# Patient Record
Sex: Female | Born: 1945 | Race: White | Hispanic: No | Smoking: Never smoker
Health system: Southern US, Community
[De-identification: ages and names within clinical notes are randomized; demographics above are authoritative.]

## PROBLEM LIST (undated history)

## (undated) DIAGNOSIS — Z86010 Personal history of colonic polyps: Secondary | ICD-10-CM

## (undated) DIAGNOSIS — A389 Scarlet fever, uncomplicated: Secondary | ICD-10-CM

## (undated) DIAGNOSIS — H3530 Unspecified macular degeneration: Secondary | ICD-10-CM

## (undated) DIAGNOSIS — R1310 Dysphagia, unspecified: Secondary | ICD-10-CM

## (undated) DIAGNOSIS — R87619 Unspecified abnormal cytological findings in specimens from cervix uteri: Secondary | ICD-10-CM

## (undated) DIAGNOSIS — IMO0002 Reserved for concepts with insufficient information to code with codable children: Secondary | ICD-10-CM

## (undated) DIAGNOSIS — S8291XA Unspecified fracture of right lower leg, initial encounter for closed fracture: Secondary | ICD-10-CM

## (undated) DIAGNOSIS — D259 Leiomyoma of uterus, unspecified: Secondary | ICD-10-CM

## (undated) DIAGNOSIS — E039 Hypothyroidism, unspecified: Secondary | ICD-10-CM

## (undated) DIAGNOSIS — M810 Age-related osteoporosis without current pathological fracture: Secondary | ICD-10-CM

## (undated) DIAGNOSIS — K449 Diaphragmatic hernia without obstruction or gangrene: Secondary | ICD-10-CM

## (undated) DIAGNOSIS — L299 Pruritus, unspecified: Secondary | ICD-10-CM

## (undated) DIAGNOSIS — K219 Gastro-esophageal reflux disease without esophagitis: Secondary | ICD-10-CM

## (undated) DIAGNOSIS — K222 Esophageal obstruction: Secondary | ICD-10-CM

## (undated) DIAGNOSIS — E782 Mixed hyperlipidemia: Secondary | ICD-10-CM

## (undated) DIAGNOSIS — N952 Postmenopausal atrophic vaginitis: Secondary | ICD-10-CM

## (undated) DIAGNOSIS — Z8719 Personal history of other diseases of the digestive system: Secondary | ICD-10-CM

## (undated) DIAGNOSIS — B009 Herpesviral infection, unspecified: Secondary | ICD-10-CM

## (undated) DIAGNOSIS — R079 Chest pain, unspecified: Secondary | ICD-10-CM

## (undated) HISTORY — PX: COLONOSCOPY: SHX174

## (undated) HISTORY — PX: GYNECOLOGIC CRYOSURGERY: SHX857

## (undated) HISTORY — DX: Unspecified macular degeneration: H35.30

## (undated) HISTORY — DX: Personal history of colonic polyps: Z86.010

## (undated) HISTORY — DX: Diaphragmatic hernia without obstruction or gangrene: K44.9

## (undated) HISTORY — DX: Gastro-esophageal reflux disease without esophagitis: K21.9

## (undated) HISTORY — DX: Unspecified fracture of right lower leg, initial encounter for closed fracture: S82.91XA

## (undated) HISTORY — DX: Dysphagia, unspecified: R13.10

## (undated) HISTORY — DX: Esophageal obstruction: K22.2

## (undated) HISTORY — DX: Chest pain, unspecified: R07.9

## (undated) HISTORY — DX: Leiomyoma of uterus, unspecified: D25.9

## (undated) HISTORY — PX: BREAST EXCISIONAL BIOPSY: SUR124

## (undated) HISTORY — DX: Hypothyroidism, unspecified: E03.9

## (undated) HISTORY — DX: Personal history of other diseases of the digestive system: Z87.19

## (undated) HISTORY — DX: Reserved for concepts with insufficient information to code with codable children: IMO0002

## (undated) HISTORY — PX: COLPOSCOPY: SHX161

## (undated) HISTORY — DX: Age-related osteoporosis without current pathological fracture: M81.0

## (undated) HISTORY — DX: Herpesviral infection, unspecified: B00.9

## (undated) HISTORY — DX: Scarlet fever, uncomplicated: A38.9

## (undated) HISTORY — PX: RHINOPLASTY: SUR1284

## (undated) HISTORY — DX: Postmenopausal atrophic vaginitis: N95.2

## (undated) HISTORY — PX: BREAST SURGERY: SHX581

## (undated) HISTORY — PX: TUBAL LIGATION: SHX77

## (undated) HISTORY — DX: Mixed hyperlipidemia: E78.2

## (undated) HISTORY — DX: Unspecified abnormal cytological findings in specimens from cervix uteri: R87.619

## (undated) HISTORY — DX: Pruritus, unspecified: L29.9

---

## 1998-07-15 ENCOUNTER — Other Ambulatory Visit: Admission: RE | Admit: 1998-07-15 | Discharge: 1998-07-15 | Payer: Self-pay | Admitting: Obstetrics and Gynecology

## 1998-07-31 ENCOUNTER — Ambulatory Visit (HOSPITAL_COMMUNITY): Admission: RE | Admit: 1998-07-31 | Discharge: 1998-07-31 | Payer: Self-pay | Admitting: Internal Medicine

## 1998-08-04 ENCOUNTER — Ambulatory Visit (HOSPITAL_COMMUNITY): Admission: RE | Admit: 1998-08-04 | Discharge: 1998-08-04 | Payer: Self-pay | Admitting: Internal Medicine

## 1998-08-05 ENCOUNTER — Encounter: Payer: Self-pay | Admitting: Internal Medicine

## 1999-07-27 ENCOUNTER — Other Ambulatory Visit: Admission: RE | Admit: 1999-07-27 | Discharge: 1999-07-27 | Payer: Self-pay | Admitting: Obstetrics and Gynecology

## 2000-09-04 ENCOUNTER — Other Ambulatory Visit: Admission: RE | Admit: 2000-09-04 | Discharge: 2000-09-04 | Payer: Self-pay | Admitting: Obstetrics and Gynecology

## 2001-09-05 ENCOUNTER — Other Ambulatory Visit: Admission: RE | Admit: 2001-09-05 | Discharge: 2001-09-05 | Payer: Self-pay | Admitting: Obstetrics and Gynecology

## 2002-09-06 ENCOUNTER — Other Ambulatory Visit: Admission: RE | Admit: 2002-09-06 | Discharge: 2002-09-06 | Payer: Self-pay | Admitting: Obstetrics and Gynecology

## 2003-09-18 ENCOUNTER — Other Ambulatory Visit: Admission: RE | Admit: 2003-09-18 | Discharge: 2003-09-18 | Payer: Self-pay | Admitting: Obstetrics and Gynecology

## 2003-10-22 ENCOUNTER — Encounter: Admission: RE | Admit: 2003-10-22 | Discharge: 2003-10-22 | Payer: Self-pay | Admitting: Orthopedic Surgery

## 2003-11-03 ENCOUNTER — Encounter: Admission: RE | Admit: 2003-11-03 | Discharge: 2003-11-03 | Payer: Self-pay | Admitting: Obstetrics and Gynecology

## 2004-09-23 ENCOUNTER — Other Ambulatory Visit: Admission: RE | Admit: 2004-09-23 | Discharge: 2004-09-23 | Payer: Self-pay | Admitting: Obstetrics and Gynecology

## 2004-11-15 ENCOUNTER — Encounter: Admission: RE | Admit: 2004-11-15 | Discharge: 2004-11-15 | Payer: Self-pay | Admitting: Obstetrics and Gynecology

## 2004-12-28 ENCOUNTER — Emergency Department (HOSPITAL_COMMUNITY): Admission: EM | Admit: 2004-12-28 | Discharge: 2004-12-28 | Payer: Self-pay | Admitting: Emergency Medicine

## 2005-02-14 ENCOUNTER — Ambulatory Visit: Payer: Self-pay | Admitting: Internal Medicine

## 2005-09-26 ENCOUNTER — Other Ambulatory Visit: Admission: RE | Admit: 2005-09-26 | Discharge: 2005-09-26 | Payer: Self-pay | Admitting: Obstetrics and Gynecology

## 2005-10-10 ENCOUNTER — Ambulatory Visit: Payer: Self-pay | Admitting: Internal Medicine

## 2005-10-12 ENCOUNTER — Ambulatory Visit: Payer: Self-pay | Admitting: Internal Medicine

## 2005-11-07 ENCOUNTER — Ambulatory Visit: Payer: Self-pay | Admitting: Gastroenterology

## 2005-12-21 ENCOUNTER — Encounter: Admission: RE | Admit: 2005-12-21 | Discharge: 2005-12-21 | Payer: Self-pay | Admitting: Obstetrics and Gynecology

## 2006-01-24 ENCOUNTER — Encounter (INDEPENDENT_AMBULATORY_CARE_PROVIDER_SITE_OTHER): Payer: Self-pay | Admitting: *Deleted

## 2006-01-24 ENCOUNTER — Ambulatory Visit: Payer: Self-pay | Admitting: Gastroenterology

## 2006-05-16 ENCOUNTER — Ambulatory Visit: Payer: Self-pay | Admitting: Internal Medicine

## 2006-09-11 ENCOUNTER — Ambulatory Visit: Payer: Self-pay | Admitting: Internal Medicine

## 2006-09-14 ENCOUNTER — Ambulatory Visit: Payer: Self-pay | Admitting: Internal Medicine

## 2006-10-10 ENCOUNTER — Ambulatory Visit: Payer: Self-pay | Admitting: Internal Medicine

## 2006-10-18 ENCOUNTER — Other Ambulatory Visit: Admission: RE | Admit: 2006-10-18 | Discharge: 2006-10-18 | Payer: Self-pay | Admitting: Obstetrics and Gynecology

## 2007-01-04 ENCOUNTER — Encounter: Admission: RE | Admit: 2007-01-04 | Discharge: 2007-01-04 | Payer: Self-pay | Admitting: Obstetrics and Gynecology

## 2007-02-10 ENCOUNTER — Encounter (INDEPENDENT_AMBULATORY_CARE_PROVIDER_SITE_OTHER): Payer: Self-pay | Admitting: *Deleted

## 2007-02-10 ENCOUNTER — Ambulatory Visit (HOSPITAL_COMMUNITY): Admission: RE | Admit: 2007-02-10 | Discharge: 2007-02-10 | Payer: Self-pay | Admitting: Pulmonary Disease

## 2007-02-14 ENCOUNTER — Ambulatory Visit: Payer: Self-pay | Admitting: Gastroenterology

## 2007-02-14 LAB — CONVERTED CEMR LAB
ALT: 41 units/L — ABNORMAL HIGH (ref 0–40)
AST: 32 units/L (ref 0–37)
Albumin: 3.9 g/dL (ref 3.5–5.2)
Alkaline Phosphatase: 63 units/L (ref 39–117)
Basophils Absolute: 0 10*3/uL (ref 0.0–0.1)
Bilirubin, Direct: 0.1 mg/dL (ref 0.0–0.3)
Eosinophils Absolute: 0.1 10*3/uL (ref 0.0–0.6)
Eosinophils Relative: 1.5 % (ref 0.0–5.0)
HCT: 35.8 % — ABNORMAL LOW (ref 36.0–46.0)
Hemoglobin: 12.3 g/dL (ref 12.0–15.0)
Lymphocytes Relative: 25.7 % (ref 12.0–46.0)
MCV: 88 fL (ref 78.0–100.0)
Monocytes Absolute: 0.4 10*3/uL (ref 0.2–0.7)
Neutro Abs: 3.8 10*3/uL (ref 1.4–7.7)
Platelets: 236 10*3/uL (ref 150–400)
RBC: 4.07 M/uL (ref 3.87–5.11)
WBC: 5.8 10*3/uL (ref 4.5–10.5)

## 2007-02-15 ENCOUNTER — Ambulatory Visit: Payer: Self-pay | Admitting: Gastroenterology

## 2007-02-15 ENCOUNTER — Encounter (INDEPENDENT_AMBULATORY_CARE_PROVIDER_SITE_OTHER): Payer: Self-pay | Admitting: *Deleted

## 2007-11-23 ENCOUNTER — Other Ambulatory Visit: Admission: RE | Admit: 2007-11-23 | Discharge: 2007-11-23 | Payer: Self-pay | Admitting: Obstetrics and Gynecology

## 2008-01-06 ENCOUNTER — Encounter: Payer: Self-pay | Admitting: Internal Medicine

## 2008-01-07 ENCOUNTER — Encounter: Admission: RE | Admit: 2008-01-07 | Discharge: 2008-01-07 | Payer: Self-pay | Admitting: Obstetrics and Gynecology

## 2008-05-14 ENCOUNTER — Telehealth (INDEPENDENT_AMBULATORY_CARE_PROVIDER_SITE_OTHER): Payer: Self-pay | Admitting: *Deleted

## 2008-05-30 ENCOUNTER — Encounter: Payer: Self-pay | Admitting: Internal Medicine

## 2008-06-02 ENCOUNTER — Encounter: Payer: Self-pay | Admitting: Internal Medicine

## 2008-07-30 ENCOUNTER — Ambulatory Visit: Payer: Self-pay | Admitting: Internal Medicine

## 2008-07-30 LAB — CONVERTED CEMR LAB
ALT: 36 units/L — ABNORMAL HIGH (ref 0–35)
AST: 33 units/L (ref 0–37)
Alkaline Phosphatase: 55 units/L (ref 39–117)
Basophils Relative: 0.5 % (ref 0.0–3.0)
Bilirubin Urine: NEGATIVE
Calcium: 9.6 mg/dL (ref 8.4–10.5)
Chloride: 102 meq/L (ref 96–112)
Creatinine, Ser: 1.2 mg/dL (ref 0.4–1.2)
GFR calc non Af Amer: 49 mL/min
HDL: 60.6 mg/dL (ref >39.0)
Hemoglobin: 13.4 g/dL (ref 12.0–15.0)
LDL Cholesterol: 115 mg/dL — ABNORMAL HIGH (ref 0–99)
Leukocytes, UA: NEGATIVE
Lymphocytes Relative: 33 % (ref 12.0–46.0)
MCV: 90.3 fL (ref 78.0–100.0)
Monocytes Absolute: 0.4 10*3/uL (ref 0.1–1.0)
Monocytes Relative: 7.4 % (ref 3.0–12.0)
Neutro Abs: 3.2 10*3/uL (ref 1.4–7.7)
Neutrophils Relative %: 56.3 % (ref 43.0–77.0)
Potassium: 4.5 meq/L (ref 3.5–5.1)
RBC: 4.36 M/uL (ref 3.87–5.11)
Sodium: 140 meq/L (ref 135–145)
Specific Gravity, Urine: 1.01 (ref 1.000–1.03)
Total Protein, Urine: NEGATIVE mg/dL
VLDL: 17 mg/dL (ref 0–40)
WBC: 5.7 10*3/uL (ref 4.5–10.5)

## 2008-08-06 ENCOUNTER — Ambulatory Visit: Payer: Self-pay | Admitting: Internal Medicine

## 2008-08-06 DIAGNOSIS — M949 Disorder of cartilage, unspecified: Secondary | ICD-10-CM

## 2008-08-06 DIAGNOSIS — M899 Disorder of bone, unspecified: Secondary | ICD-10-CM

## 2008-08-06 DIAGNOSIS — M810 Age-related osteoporosis without current pathological fracture: Secondary | ICD-10-CM | POA: Insufficient documentation

## 2008-08-07 DIAGNOSIS — A389 Scarlet fever, uncomplicated: Secondary | ICD-10-CM

## 2008-08-07 DIAGNOSIS — E039 Hypothyroidism, unspecified: Secondary | ICD-10-CM

## 2008-08-07 DIAGNOSIS — N952 Postmenopausal atrophic vaginitis: Secondary | ICD-10-CM

## 2008-08-07 DIAGNOSIS — Z8719 Personal history of other diseases of the digestive system: Secondary | ICD-10-CM

## 2008-08-07 DIAGNOSIS — E782 Mixed hyperlipidemia: Secondary | ICD-10-CM

## 2008-08-07 DIAGNOSIS — D259 Leiomyoma of uterus, unspecified: Secondary | ICD-10-CM

## 2008-08-07 DIAGNOSIS — IMO0002 Reserved for concepts with insufficient information to code with codable children: Secondary | ICD-10-CM

## 2008-08-07 DIAGNOSIS — L299 Pruritus, unspecified: Secondary | ICD-10-CM | POA: Insufficient documentation

## 2008-08-07 HISTORY — DX: Pruritus, unspecified: L29.9

## 2008-08-07 HISTORY — DX: Mixed hyperlipidemia: E78.2

## 2008-08-07 HISTORY — DX: Postmenopausal atrophic vaginitis: N95.2

## 2008-08-07 HISTORY — DX: Scarlet fever, uncomplicated: A38.9

## 2008-08-07 HISTORY — DX: Reserved for concepts with insufficient information to code with codable children: IMO0002

## 2008-08-07 HISTORY — DX: Hypothyroidism, unspecified: E03.9

## 2008-08-07 HISTORY — DX: Leiomyoma of uterus, unspecified: D25.9

## 2008-08-07 HISTORY — DX: Personal history of other diseases of the digestive system: Z87.19

## 2008-11-24 ENCOUNTER — Other Ambulatory Visit: Admission: RE | Admit: 2008-11-24 | Discharge: 2008-11-24 | Payer: Self-pay | Admitting: Obstetrics and Gynecology

## 2008-11-24 ENCOUNTER — Ambulatory Visit: Payer: Self-pay | Admitting: Obstetrics and Gynecology

## 2008-11-24 ENCOUNTER — Encounter: Payer: Self-pay | Admitting: Obstetrics and Gynecology

## 2008-11-26 ENCOUNTER — Ambulatory Visit: Payer: Self-pay | Admitting: Internal Medicine

## 2009-01-14 ENCOUNTER — Encounter: Admission: RE | Admit: 2009-01-14 | Discharge: 2009-01-14 | Payer: Self-pay | Admitting: Obstetrics and Gynecology

## 2009-02-18 ENCOUNTER — Telehealth: Payer: Self-pay | Admitting: Internal Medicine

## 2009-04-17 ENCOUNTER — Telehealth (INDEPENDENT_AMBULATORY_CARE_PROVIDER_SITE_OTHER): Payer: Self-pay | Admitting: *Deleted

## 2009-04-29 ENCOUNTER — Ambulatory Visit: Payer: Self-pay | Admitting: Internal Medicine

## 2009-04-29 LAB — CONVERTED CEMR LAB
ALT: 50 units/L — ABNORMAL HIGH (ref 0–35)
AST: 46 units/L — ABNORMAL HIGH (ref 0–37)
Alkaline Phosphatase: 68 units/L (ref 39–117)
BUN: 18 mg/dL (ref 6–23)
Bilirubin Urine: NEGATIVE
Bilirubin, Direct: 0.2 mg/dL (ref 0.0–0.3)
Calcium: 9.5 mg/dL (ref 8.4–10.5)
Chloride: 105 meq/L (ref 96–112)
Creatinine, Ser: 1.1 mg/dL (ref 0.4–1.2)
Glucose, Bld: 96 mg/dL (ref 70–99)
HCT: 38 % (ref 36.0–46.0)
Leukocytes, UA: NEGATIVE
Lymphocytes Relative: 31.8 % (ref 12.0–46.0)
Lymphs Abs: 1.5 10*3/uL (ref 0.7–4.0)
MCHC: 34.2 g/dL (ref 30.0–36.0)
MCV: 89.3 fL (ref 78.0–100.0)
Monocytes Relative: 8.8 % (ref 3.0–12.0)
Neutrophils Relative %: 55.9 % (ref 43.0–77.0)
Platelets: 197 10*3/uL (ref 150.0–400.0)
Potassium: 4.4 meq/L (ref 3.5–5.1)
RBC: 4.25 M/uL (ref 3.87–5.11)
RDW: 11.9 % (ref 11.5–14.6)
Specific Gravity, Urine: 1.01 (ref 1.000–1.030)
TSH: 0.55 microintl units/mL (ref 0.35–5.50)
Total CHOL/HDL Ratio: 3
Total Protein: 7.4 g/dL (ref 6.0–8.3)
Urine Glucose: NEGATIVE mg/dL
Urobilinogen, UA: 0.2 (ref 0.0–1.0)
WBC: 4.6 10*3/uL (ref 4.5–10.5)

## 2009-06-02 ENCOUNTER — Encounter: Payer: Self-pay | Admitting: Internal Medicine

## 2009-06-03 ENCOUNTER — Telehealth: Payer: Self-pay | Admitting: Internal Medicine

## 2009-06-18 ENCOUNTER — Telehealth: Payer: Self-pay | Admitting: Internal Medicine

## 2009-06-19 ENCOUNTER — Telehealth (INDEPENDENT_AMBULATORY_CARE_PROVIDER_SITE_OTHER): Payer: Self-pay | Admitting: *Deleted

## 2009-06-25 ENCOUNTER — Ambulatory Visit: Payer: Self-pay | Admitting: Internal Medicine

## 2009-06-25 LAB — CONVERTED CEMR LAB
Albumin: 4 g/dL (ref 3.5–5.2)
Alkaline Phosphatase: 66 units/L (ref 39–117)
Bilirubin, Direct: 0.1 mg/dL (ref 0.0–0.3)

## 2009-06-26 ENCOUNTER — Telehealth (INDEPENDENT_AMBULATORY_CARE_PROVIDER_SITE_OTHER): Payer: Self-pay | Admitting: *Deleted

## 2009-06-26 ENCOUNTER — Encounter: Payer: Self-pay | Admitting: Internal Medicine

## 2009-07-01 ENCOUNTER — Encounter: Payer: Self-pay | Admitting: Internal Medicine

## 2009-07-05 ENCOUNTER — Telehealth: Payer: Self-pay | Admitting: Internal Medicine

## 2009-11-27 ENCOUNTER — Other Ambulatory Visit: Admission: RE | Admit: 2009-11-27 | Discharge: 2009-11-27 | Payer: Self-pay | Admitting: Obstetrics and Gynecology

## 2009-11-27 ENCOUNTER — Ambulatory Visit: Payer: Self-pay | Admitting: Obstetrics and Gynecology

## 2009-12-23 ENCOUNTER — Ambulatory Visit: Payer: Self-pay | Admitting: Obstetrics and Gynecology

## 2010-01-19 LAB — HM MAMMOGRAPHY: HM Mammogram: NORMAL

## 2010-02-02 ENCOUNTER — Encounter: Admission: RE | Admit: 2010-02-02 | Discharge: 2010-02-02 | Payer: Self-pay | Admitting: Obstetrics and Gynecology

## 2010-02-08 ENCOUNTER — Encounter (INDEPENDENT_AMBULATORY_CARE_PROVIDER_SITE_OTHER): Payer: Self-pay | Admitting: *Deleted

## 2010-02-08 ENCOUNTER — Ambulatory Visit: Payer: Self-pay | Admitting: Obstetrics and Gynecology

## 2010-02-15 ENCOUNTER — Ambulatory Visit: Payer: Self-pay | Admitting: Obstetrics and Gynecology

## 2010-03-04 ENCOUNTER — Telehealth: Payer: Self-pay | Admitting: Internal Medicine

## 2010-03-23 ENCOUNTER — Ambulatory Visit: Payer: Self-pay | Admitting: Internal Medicine

## 2010-03-23 DIAGNOSIS — K219 Gastro-esophageal reflux disease without esophagitis: Secondary | ICD-10-CM | POA: Insufficient documentation

## 2010-03-23 DIAGNOSIS — R079 Chest pain, unspecified: Secondary | ICD-10-CM | POA: Insufficient documentation

## 2010-03-23 DIAGNOSIS — Z860100 Personal history of colon polyps, unspecified: Secondary | ICD-10-CM

## 2010-03-23 DIAGNOSIS — R1310 Dysphagia, unspecified: Secondary | ICD-10-CM

## 2010-03-23 HISTORY — DX: Gastro-esophageal reflux disease without esophagitis: K21.9

## 2010-03-23 HISTORY — DX: Personal history of colon polyps, unspecified: Z86.0100

## 2010-03-23 HISTORY — DX: Chest pain, unspecified: R07.9

## 2010-03-23 HISTORY — DX: Personal history of colonic polyps: Z86.010

## 2010-03-23 HISTORY — DX: Dysphagia, unspecified: R13.10

## 2010-03-24 ENCOUNTER — Telehealth (INDEPENDENT_AMBULATORY_CARE_PROVIDER_SITE_OTHER): Payer: Self-pay | Admitting: *Deleted

## 2010-03-29 ENCOUNTER — Telehealth (INDEPENDENT_AMBULATORY_CARE_PROVIDER_SITE_OTHER): Payer: Self-pay | Admitting: *Deleted

## 2010-03-30 ENCOUNTER — Ambulatory Visit: Payer: Self-pay | Admitting: Cardiology

## 2010-03-30 ENCOUNTER — Ambulatory Visit: Payer: Self-pay

## 2010-03-30 ENCOUNTER — Encounter (HOSPITAL_COMMUNITY): Admission: RE | Admit: 2010-03-30 | Discharge: 2010-05-19 | Payer: Self-pay | Admitting: Internal Medicine

## 2010-04-02 ENCOUNTER — Telehealth: Payer: Self-pay | Admitting: Internal Medicine

## 2010-05-04 ENCOUNTER — Ambulatory Visit: Payer: Self-pay | Admitting: Internal Medicine

## 2010-05-07 ENCOUNTER — Telehealth: Payer: Self-pay | Admitting: Internal Medicine

## 2010-06-14 ENCOUNTER — Telehealth: Payer: Self-pay | Admitting: Internal Medicine

## 2010-07-16 ENCOUNTER — Ambulatory Visit: Payer: Self-pay | Admitting: Internal Medicine

## 2010-07-16 LAB — CONVERTED CEMR LAB
ALT: 62 units/L — ABNORMAL HIGH (ref 0–35)
Alkaline Phosphatase: 60 units/L (ref 39–117)
Basophils Absolute: 0 10*3/uL (ref 0.0–0.1)
Bilirubin Urine: NEGATIVE
Bilirubin, Direct: 0.1 mg/dL (ref 0.0–0.3)
Calcium: 9 mg/dL (ref 8.4–10.5)
Chloride: 101 meq/L (ref 96–112)
Cholesterol: 172 mg/dL (ref 0–200)
Creatinine, Ser: 1.1 mg/dL (ref 0.4–1.2)
Eosinophils Absolute: 0.2 10*3/uL (ref 0.0–0.7)
GFR calc non Af Amer: 52.1 mL/min (ref >60)
HCT: 37 % (ref 36.0–46.0)
Hemoglobin, Urine: NEGATIVE
LDL Cholesterol: 94 mg/dL (ref 0–99)
Lymphocytes Relative: 31.9 % (ref 12.0–46.0)
Lymphs Abs: 1.7 10*3/uL (ref 0.7–4.0)
MCHC: 33.8 g/dL (ref 30.0–36.0)
MCV: 90.5 fL (ref 78.0–100.0)
Monocytes Absolute: 0.6 10*3/uL (ref 0.1–1.0)
Sodium: 135 meq/L (ref 135–145)
Specific Gravity, Urine: 1.01 (ref 1.000–1.030)
Total Bilirubin: 0.7 mg/dL (ref 0.3–1.2)
Total Protein, Urine: NEGATIVE mg/dL
Urine Glucose: NEGATIVE mg/dL
Urobilinogen, UA: 0.2 (ref 0.0–1.0)
VLDL: 17 mg/dL (ref 0.0–40.0)

## 2010-07-28 ENCOUNTER — Ambulatory Visit: Payer: Self-pay | Admitting: Internal Medicine

## 2010-10-27 ENCOUNTER — Ambulatory Visit: Payer: Self-pay | Admitting: Internal Medicine

## 2010-11-02 LAB — CONVERTED CEMR LAB: ALT: 40 units/L — ABNORMAL HIGH (ref 0–35)

## 2010-11-16 ENCOUNTER — Telehealth: Payer: Self-pay | Admitting: Internal Medicine

## 2010-11-29 ENCOUNTER — Other Ambulatory Visit
Admission: RE | Admit: 2010-11-29 | Discharge: 2010-11-29 | Payer: Self-pay | Source: Home / Self Care | Admitting: Obstetrics and Gynecology

## 2010-11-29 ENCOUNTER — Ambulatory Visit: Payer: Self-pay | Admitting: Obstetrics and Gynecology

## 2011-01-08 ENCOUNTER — Other Ambulatory Visit: Payer: Self-pay | Admitting: Obstetrics and Gynecology

## 2011-01-08 DIAGNOSIS — Z1239 Encounter for other screening for malignant neoplasm of breast: Secondary | ICD-10-CM

## 2011-01-09 ENCOUNTER — Encounter: Payer: Self-pay | Admitting: Orthopedic Surgery

## 2011-01-09 ENCOUNTER — Encounter: Payer: Self-pay | Admitting: Gastroenterology

## 2011-01-10 ENCOUNTER — Ambulatory Visit
Admission: RE | Admit: 2011-01-10 | Discharge: 2011-01-10 | Payer: Self-pay | Source: Home / Self Care | Attending: Internal Medicine | Admitting: Internal Medicine

## 2011-01-10 ENCOUNTER — Encounter: Payer: Self-pay | Admitting: Internal Medicine

## 2011-01-10 DIAGNOSIS — K449 Diaphragmatic hernia without obstruction or gangrene: Secondary | ICD-10-CM

## 2011-01-10 DIAGNOSIS — K222 Esophageal obstruction: Secondary | ICD-10-CM

## 2011-01-10 HISTORY — DX: Esophageal obstruction: K22.2

## 2011-01-10 HISTORY — DX: Diaphragmatic hernia without obstruction or gangrene: K44.9

## 2011-01-12 ENCOUNTER — Encounter: Payer: Self-pay | Admitting: Internal Medicine

## 2011-01-12 ENCOUNTER — Ambulatory Visit
Admission: RE | Admit: 2011-01-12 | Discharge: 2011-01-12 | Payer: Self-pay | Source: Home / Self Care | Attending: Internal Medicine | Admitting: Internal Medicine

## 2011-01-18 ENCOUNTER — Ambulatory Visit
Admission: RE | Admit: 2011-01-18 | Discharge: 2011-01-18 | Payer: Self-pay | Source: Home / Self Care | Attending: Obstetrics and Gynecology | Admitting: Obstetrics and Gynecology

## 2011-01-20 NOTE — Letter (Signed)
Summary: EGD Instructions  [facility]  [address] [place], [place] [address]   Phone: [phone]  Fax: [phone]       KANIKA BUNGERT    11/17/1946    MRN: [id number]       Procedure Day /Date:WEDNESDAY, 01/12/11     Arrival Time: 8:30 AM     Procedure Time:9:30 AM     Location of Procedure:                    X [facility] (4th Floor)  PREPARATION FOR ENDOSCOPY/DIL   On 01/12/11 THE DAY OF THE PROCEDURE:  1.   No solid foods, milk or milk products are allowed after midnight the night before your procedure.  2.   Do not drink anything colored red or purple.  Avoid juices with pulp.  No orange juice.  3.  You may drink clear liquids until 7:30 AM, which is 2 hours before your procedure.                                                                                                CLEAR LIQUIDS INCLUDE: Water Jello Ice Popsicles Tea (sugar ok, no milk/cream) Powdered fruit flavored drinks Coffee (sugar ok, no milk/cream) Gatorade Juice: apple, white grape, white cranberry  Lemonade Clear bullion, consomm, broth Carbonated beverages (any kind) Strained chicken noodle soup Hard Candy   MEDICATION INSTRUCTIONS  Unless otherwise instructed, you should take regular prescription medications with a small sip of water as early as possible the morning of your procedure.          OTHER INSTRUCTIONS  You will need a responsible adult at least 65 years of age to accompany you and drive you home.   This person must remain in the waiting room during your procedure.  Wear loose fitting clothing that is easily removed.  Leave jewelry and other valuables at home.  However, you may wish to bring a book to read or an iPod/MP3 player to listen to music as you wait for your procedure to start.  Remove all body piercing jewelry and leave at home.  Total time from sign-in until discharge is approximately 2-3 hours.  You should go home directly  after your procedure and rest.  You can resume normal activities the day after your procedure.  The day of your procedure you should not:   Drive   Make legal decisions   Operate machinery   Drink alcohol   Return to work  You will receive specific instructions about eating, activities and medications before you leave.    The above instructions have been reviewed and explained to me by   _______________________    I fully understand and can verbalize these instructions _____________________________ Date _________

## 2011-01-20 NOTE — Procedures (Signed)
Summary: Upper Endoscopy  Patient: Dawn Tucker Note: All result statuses are Final unless otherwise noted.  Tests: (1) Upper Endoscopy (EGD)   EGD Upper Endoscopy       DONE     [facility]     [address]     [place], [place]  [address]           ENDOSCOPY PROCEDURE REPORT           PATIENT:  Dawn, Tucker  MR#:  [id number]     BIRTHDATE:  October 09, 1946, 63 yrs. old  GENDER:  female           ENDOSCOPIST:  Wilhemina Bonito. Eda Keys, MD     Referred by:  Office           PROCEDURE DATE:  05/04/2010     PROCEDURE:  EGD, diagnostic,     Maloney Dilation of Esophagus - 72F     ASA CLASS:  Class II     INDICATIONS:  GERD, chest pain, dysphagia           MEDICATIONS:   Fentanyl 75 mcg IV, Versed 8 mg IV     TOPICAL ANESTHETIC:  Exactacain Spray           DESCRIPTION OF PROCEDURE:   After the risks benefits and     alternatives of the procedure were thoroughly explained, informed     consent was obtained.  The [facility] GIF-H180 [id number] endoscope was     introduced through the mouth and advanced to the second portion of     the duodenum, without limitations.  The instrument was slowly     withdrawn as the mucosa was fully examined.     <<PROCEDUREIMAGES>>           The upper, middle, and distal third of the esophagus were     carefully inspected and no abnormalities were noted except for     large caliber stricture at GE junction. The z-line was well seen     at the GEJ. The endoscope was pushed into the fundus which was     normal including a retroflexed view. The antrum,gastric body,     first and second part of the duodenum were unremarkable.     Retroflexed views revealed no abnormalities.    The scope was then     withdrawn from the patient and the procedure completed.           THERAPY: 54 F MALONEY DILATOR PASSED W/O RESISTANCE OR HEME.     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 Resume     prior diet tomorrow.     2) Continue current medication for GERD     3) CALL FOR ANY FURTHER PROBLEMS OR QUESTIONS           ______________________________     Wilhemina Bonito. Eda Keys, MD           CC:  Jacques Navy, MD, The Patient           n.  eSIGNED:   Wilhemina Bonito. Eda Keys at 05/04/2010 02:08 PM           Mitchel Honour, [id number]  Note: An exclamation mark (!) indicates a result that was not dispersed into the flowsheet. Document Creation Date: 05/06/2010 5:07 PM _______________________________________________________________________  (1) Order result status: Final Collection or observation date-time: 05/04/2010 14:00 Requested date-time:  Receipt date-time:  Reported date-time:  Referring Physician:   Ordering Physician: Fransico Setters [id number]) Specimen Source:  Source: Launa Grill Order Number: [id number] Lab site:

## 2011-01-20 NOTE — Progress Notes (Signed)
Summary: Labs?   Phone Note Call from Patient Call back at Home Phone [phone] Call back at [phone]   Summary of Call: Patient has cpx scheduled for august. She needs refills. Does pt need cpx labs prior to having refills?  Initial call taken by: Lamar Sprinkles, CMA,  June 14, 2010 4:41 PM  Follow-up for Phone Call        may have refills. I am afraid if we order labs at less than a year her insurance company may balk. Follow-up by: Jacques Navy MD,  June 14, 2010 6:00 PM  Additional Follow-up for Phone Call Additional follow up Details #1::        lmovm for pt to call back Additional Follow-up by: Ami Bullins CMA,  June 15, 2010 8:57 AM    Additional Follow-up for Phone Call Additional follow up Details #2::    Pt informed  Follow-up by: Lamar Sprinkles, CMA,  June 16, 2010 1:32 PM  Prescriptions: SIMVASTATIN 20 MG TABS (SIMVASTATIN) Take 1 tablet by mouth once a day  #90 x 3   Entered by:   Lamar Sprinkles, CMA   Authorized by:   Jacques Navy MD   Signed by:   Lamar Sprinkles, CMA on 06/16/2010   Method used:   Electronically to        [facility] (retail)       [address]       [place], [place]  [address]       Ph: [phone] or [phone]       Fax: [phone]   RxID:   [id number] SYNTHROID 88 MCG TABS (LEVOTHYROXINE SODIUM) Take 1 tablet by mouth once a day Brand medically necessary #90 x 3   Entered by:   Lamar Sprinkles, CMA   Authorized by:   Jacques Navy MD   Signed by:   Lamar Sprinkles, CMA on 06/16/2010   Method used:   Electronically to        [facility] (retail)       [address]       [place], [place]  [address]       Ph: [phone] or [phone]       Fax: [phone]   RxID:   [id number]

## 2011-01-20 NOTE — Progress Notes (Signed)
  Phone Note Outgoing Call   Reason for Call: Discuss lab or test results Summary of Call: Please call patient - stress test was negative for any signs or evidence of blocked coronaries.  Thanks Initial call taken by: Jacques Navy MD,  April 02, 2010 5:44 AM  Follow-up for Phone Call        informed pt of results Follow-up by: Ami Bullins CMA,  April 02, 2010 9:16 AM

## 2011-01-20 NOTE — Progress Notes (Signed)
  Phone Note From Other Clinic   Reason for Call: Medication Check Summary of Call: Report from Dr. 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 We discussed reasonable therapy and agree on anxiolytic and SSRI. Plan - alprazolam 0.25 mg three times a day, sertraline 50mg  once daily. Will taper down or off alprazolam as sertraline takes effect.  Initial call taken by: Jacques Navy MD,  March 04, 2010 7:30 PM    New/Updated Medications: ALPRAZOLAM 0.25 MG TABS (ALPRAZOLAM) 1 by mouth three times a day for anxiety SERTRALINE HCL 50 MG TABS (SERTRALINE HCL) 1 by mouth once daily Prescriptions: SERTRALINE HCL 50 MG TABS (SERTRALINE HCL) 1 by mouth once daily  #30 x 5   Entered and Authorized by:   Jacques Navy MD   Signed by:   Jacques Navy MD on 03/04/2010   Method used:   Telephoned to ...       [facility] (retail)       [address]       [place], [place]  [address]       Ph: [phone] or [phone]       Fax: [phone]   RxID:   [id number] ALPRAZOLAM 0.25 MG TABS (ALPRAZOLAM) 1 by mouth three times a day for anxiety  #90 x 1   Entered and Authorized by:   Jacques Navy MD   Signed by:   Jacques Navy MD on 03/04/2010   Method used:   Telephoned to ...       [facility] (retail)       [address]       [place], [place]  [address]       Ph: [phone] or [phone]       Fax: [phone]   RxID:   [id number]

## 2011-01-20 NOTE — Procedures (Signed)
Summary: Silvana Newness, MD   Colonoscopy  Procedure date:  02/15/2007  Findings:      Location:  [facility].  Hiatal Hernia Patient Name: Dawn Tucker, Dawn Tucker. MRN:  Procedure Procedures: Panendoscopy (EGD) CPT: 43235.    with biopsy(s)/brushing(s). CPT: D1846139.  Personnel: Endoscopist: Ulyess Mort, MD.  Exam Location: Exam performed in Outpatient Clinic. Outpatient  Patient Consent: Procedure, Alternatives, Risks and Benefits discussed, consent obtained, from patient. Consent was obtained by the RN.  Indications Symptoms: Dyspepsia, Abdominal pain, Reflux symptoms  History  Current Medications: Patient is not currently taking Coumadin.  Pre-Exam Physical: Entire physical exam was normal.  Comments: Pt. history reviewed/updated, physical exam performed prior to initiation of sedation? Exam Exam Info: Maximum depth of insertion Duodenum, intended Duodenum. Patient position: on left side. Vocal cords visualized. Gastric retroflexion performed. Images taken. ASA Classification: II. Tolerance: good.  Sedation Meds: Patient assessed and found to be appropriate for moderate (conscious) sedation. Fentanyl given IV. Versed given IV. Cetacaine Spray given aerosolized.  Monitoring: BP and pulse monitoring done. Oximetry used. Supplemental O2 given  Findings - HIATAL HERNIA: 3 cms. in length. prob. s.s. barretts. Biopsy/Hiatal Hernia taken.  ICD9: Hernia, Hiatal: 553.3. - Normal: Duodenal Bulb to Jejunum. Not Seen: Sprue. Biopsy/Normal taken. Comments: bx. r/o sprue.  - NODULE: in Antrum. RUT done, results pending.  Comments: minn. antritis.   Assessment Abnormal examination, see findings above.  Diagnoses: 553.3: Hernia, Hiatal.   Events  Unplanned Intervention: No unplanned interventions were required.  Unplanned Events: There were no complications. Plans Medication(s): Continue current medications. PPI: Aciphex 20 mg QAM,   Patient  Education: Patient given standard instructions for: Barrett's. Hiatal Hernia. Reflux. Mucosal Abnormality.  Disposition: After procedure patient sent to recovery. After recovery patient sent home.  Scheduling: Await pathology to schedule patient.   This report was created from the original endoscopy report, which was reviewed and signed by the above listed endoscopist.   cc:  Illene Regulus, MD

## 2011-01-20 NOTE — Progress Notes (Signed)
Summary: Nuclear pre procedure  Phone Note Outgoing Call Call back at [facility] Phone [phone]   Call placed by: Rea College, CMA,  March 29, 2010 4:20 PM Call placed to: Patient Summary of Call: Left message with information on [employer] Information Sheet (see scanned document for details).      Nuclear Med Background Indications for Stress Test: Evaluation for Ischemia   History: Echo  History Comments: '01 Stress Echo:normal, EF=60%  Symptoms: Chest Pain  Symptoms Comments: CP>neck/arm   Nuclear Pre-Procedure Cardiac Risk Factors: Family History - CAD, Lipids Height (in): 66

## 2011-01-20 NOTE — Letter (Signed)
Summary: EGD Instructions  [facility]  [address] [place], [place] [address]   Phone: [phone]  Fax: [phone]       Dawn Tucker    [birth year]    MRN: [id number]       Procedure Day /Date:TUESDAY, 05/04/10     Arrival Time: 12:30 PM     Procedure Time:1:30 PM     Location of Procedure:                    X [facility] (4th Floor)   PREPARATION FOR ENDOSCOPY   OnTUESDAY, 05/04/10 THE DAY OF THE PROCEDURE:  1.   No solid foods, milk or milk products are allowed after midnight the night before your procedure.  2.   Do not drink anything colored red or purple.  Avoid juices with pulp.  No orange juice.  3.  You may drink clear liquids until11:30 AM, which is 2 hours before your procedure.                                                                                                CLEAR LIQUIDS INCLUDE: Water Jello Ice Popsicles Tea (sugar ok, no milk/cream) Powdered fruit flavored drinks Coffee (sugar ok, no milk/cream) Gatorade Juice: apple, white grape, white cranberry  Lemonade Clear bullion, consomm, broth Carbonated beverages (any kind) Strained chicken noodle soup Hard Candy   MEDICATION INSTRUCTIONS  Unless otherwise instructed, you should take regular prescription medications with a small sip of water as early as possible the morning of your procedure.             OTHER INSTRUCTIONS  You will need a responsible adult at least 65 years of age to accompany you and drive you home.   This person must remain in the waiting room during your procedure.  Wear loose fitting clothing that is easily removed.  Leave jewelry and other valuables at home.  However, you may wish to bring a book to read or an iPod/MP3 player to listen to music as you wait for your procedure to start.  Remove all body piercing jewelry and leave at home.  Total time from sign-in until discharge is approximately 2-3 hours.  You should go  home directly after your procedure and rest.  You can resume normal activities the day after your procedure.  The day of your procedure you should not:   Drive   Make legal decisions   Operate machinery   Drink alcohol   Return to work  You will receive specific instructions about eating, activities and medications before you leave.    The above instructions have been reviewed and explained to me by   _______________________    I fully understand and can verbalize these instructions _____________________________ Date _________

## 2011-01-20 NOTE — Progress Notes (Signed)
Summary: refill--Lunesta  Phone Note Refill Request Message from:  Fax from Pharmacy on May 07, 2010 3:50 PM  Refills Requested: Medication #1:  LUNESTA 2 MG  TABS Take 1 tab by mouth at bedtime as needed Initial call taken by: Lucious Groves,  May 07, 2010 3:50 PM  Follow-up for Phone Call        refill done. left msg Follow-up by: Jacques Navy MD,  May 07, 2010 6:52 PM    Prescriptions: LUNESTA 2 MG  TABS (ESZOPICLONE) Take 1 tab by mouth at bedtime as needed  #30 x 5   Entered and Authorized by:   Jacques Navy MD   Signed by:   Jacques Navy MD on 05/07/2010   Method used:   Telephoned to ...       [facility] (retail)       [address]       [place], [place]  [address]       Ph: [phone] or [phone]       Fax: [phone]   RxID:   [id number]

## 2011-01-20 NOTE — Assessment & Plan Note (Signed)
Summary: CHEST PAIN / GERD / dysphagia    History of Present Illness Visit Type: Initial Visit Primary GI MD: Yancey Flemings MD Primary Provider: Jacques Navy MD Chief Complaint: Patient complains of some tightness when she eats or drinks. This happens once or twice a months. She states that she has some sharp pains in her epigastric pain that is relieved with a Zantac. She has alot of questions about what to do about taking her Nexium.  History of Present Illness:   65 year old female with a history of hypothyroidism, dyslipidemia, constipation predominant irritable bowel, and GERD. She has not been seen in this office since February 2008. Her GI care was previously provided by Dr. Corinda Gubler. Patient presents today with new complaints of chest pain and dysphagia. For GERD, she was on Nexium daily since 2008 until about one year ago when she reduce the frequency of the medication to once every other day. She changed the frequency due to concerns over long-term PPI use. She wishes to discuss this issue. Upper endoscopy in February 2008 was essentially normal. Testing for Helicobacter pylori was negative. In terms of chest pain, this has been going on for about one year. She describes tightness with radiation to the jaw that lasts less than 2-3 minutes and is not affected by activity, meals, or stress. There is no associated shortness of breath. She does mention that taking Zantac with a glass of water Will predictably relieve this discomfort within seconds. The frequency is about twice per month. She does raise the question of possible cardiac pain. Next, she has been experiencing problems with increased regurgitation and gurgling over the past year. No significant pyrosis. Finally, intermittent solid food dysphagia over the past year with stable frequency. Her only other GI complaint is that of increased intestinal gas and occasional constipation which is managed with over-the-counter agents. In addition to  her upper endoscopy in 2008, she underwent complete colonoscopy in February of 2007. This was normal except for a few diminutive hyperplastic polyps that were removed. Followup around 2015 recommended.   GI Review of Systems    Reports abdominal pain, acid reflux, bloating, chest pain, and  dysphagia with solids.     Location of  Abdominal pain: epigastric area.    Denies belching, dysphagia with liquids, heartburn, loss of appetite, nausea, vomiting, vomiting blood, weight loss, and  weight gain.      Reports constipation and  irritable bowel syndrome.     Denies anal fissure, black tarry stools, change in bowel habit, diarrhea, diverticulosis, fecal incontinence, heme positive stool, hemorrhoids, jaundice, light color stool, liver problems, rectal bleeding, and  rectal pain. Preventive Screening-Counseling & Management      Drug Use:  no.      Current Medications (verified): 1)  Simvastatin 20 Mg Tabs (Simvastatin) .... Take 1 Tablet By Mouth Once A Day 2)  Synthroid 88 Mcg Tabs (Levothyroxine Sodium) .... Take 1 Tablet By Mouth Once A Day 3)  Nexium 40 Mg  Cpdr (Esomeprazole Magnesium) .... Take One By Mouth Every Three Days As Needed 4)  Vagifem 10 Mcg Tabs (Estradiol) .... Insert One Vaginally  Twice A Week 5)  Lunesta 2 Mg  Tabs (Eszopiclone) .... Take 1 Tab By Mouth At Bedtime As Needed 6)  Multivitamins   Tabs (Multiple Vitamin) .... Take 1 Tablet By Mouth Once A Day 7)  Chewable Calcium 500-200-40 Mg-Unt-Mcg  Chew (Calcium-Vitamin D-Vitamin K) .... Take 3 Tablets By Mouth Once A Day 8)  Gas-X Extra  Strength 125 Mg  Caps (Simethicone) .... As Directed As Needed 9)  Alprazolam 0.25 Mg Tabs (Alprazolam) .... Take One By Mouth At Bedtime As Needed 10)  Zantac 150 Mg Tabs (Ranitidine Hcl) .... Take One By Mouth As Needed  Allergies (verified): 1)  ! 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 Eda Paschal                  Ortho - Dr. Kelli Churn - Dr. Othella Boyer - Dr. Nile Riggs GERD  Past Surgical History: Reviewed history from 03/18/2010 and no changes required. Rhinoplasty Breast biopsies Right and left - benign lesions C-section x 1 G2P2  Family History: father - deceased @ 51: CAD/MI mother- deceased @ 7: fatal pneumonia P Aunt - breast cancer Neg - colon cancer, DM Family History of Prostate Cancer: maternal grandfather  Social History: Lennie Hummer married '68 1 son - '79, 1 daughter - '71; 2 grandchildren Marriage is in good health Very active in community affairs. Alcohol Use - yes occasional  Daily Caffeine Use occasional  Illicit Drug Use - no  Review of Systems  The patient denies allergy/sinus, anemia, anxiety-new, arthritis/joint pain, back pain, blood in urine, breast changes/lumps, change in vision, confusion, cough, coughing up blood, depression-new, fainting, fatigue, fever, headaches-new, hearing problems, heart murmur, heart rhythm changes, itching, menstrual pain, muscle pains/cramps, night sweats, nosebleeds, pregnancy symptoms, shortness of breath, skin rash, sleeping problems, sore throat, swelling of feet/legs, swollen lymph glands, thirst - excessive , urination - excessive , urination changes/pain, urine leakage, vision changes, and voice change.    Vital Signs:  Patient profile:   65 year old female Height:      66 inches Weight:      109.4 pounds BMI:     17.72 Pulse rate:   64 / minute Pulse rhythm:   regular BP sitting:   118 / 72  (left arm) Cuff size:   regular  Vitals Entered By:  Harlow Mares CMA Duncan Dull) (March 23, 2010 9:31 AM)  Physical Exam  General:  Well developed, well nourished, no acute distress. Head:  Normocephalic and atraumatic. Eyes:  PERRLA, no icterus. Ears:  Normal auditory acuity. Nose:  No deformity, discharge,  or lesions. Mouth:  No deformity or lesions, dentition normal. Neck:  Supple; no masses or thyromegaly. Lungs:  Clear throughout to auscultation. Heart:  Regular rate and rhythm; no murmurs, rubs,  or bruits. Abdomen:  Soft, nontender and nondistended. No masses, hepatosplenomegaly or hernias noted. Normal bowel sounds. Msk:  Symmetrical with no gross deformities. Normal posture. Pulses:  Normal pulses noted. Extremities:  No  edema or deformities noted. Neurologic:  Alert and  oriented x4;   Skin:  Intact without significant lesions or rashes. Cervical Nodes:  No significant cervical adenopathy.no supraclavicular adenopathy Psych:  Alert and cooperative. Normal mood and affect.   Impression & Recommendations:  Problem # 1:  CHEST PAIN (ICD-786.50) atypical chest pain as described.  Suspect that this may be related to acid reflux with spasm.  Plan: #1. Reflux precautions #2. Resume Nexium 40 mg daily. We discussed the salient issues regarding chronic PPI use. We especially touched on concerns regarding decreased bone density. Fortunately, she does have regular monitoring and management of her bone density. #3. Refer to Dr. Debby Bud regarding chest pain. The family reports a significant cardiac history. As such, it may be reasonable for further evaluation and consideration of a stress test. I will leave this to the discretion of Dr. Debby Bud. He has kindly agreed to see her today  Problem # 2:  DYSPHAGIA UNSPECIFIED (ICD-787.20) intermittent solid food dysphagia. Likely due to esophageal edema from reflux and or stricture. Unremarkable endoscopy in 2008 as described.  Plan: #1. Resume PPI daily #2. Schedule upper endoscopy with  possible esophageal dilation. The nature of the procedure as well as the risks, benefits, and alternatives were reviewed in detail. She understood and agreed to proceed.  Problem # 3:  GERD (ICD-530.81) seems to be having intermittent GERD symptoms with sporadic PPI use  Plan: #1. Reflux precautions #2. Prescribed Nexium 40 mg daily with multiple refills  Problem # 4:  PERSONAL HX COLONIC POLYPS (ICD-V12.72) history of diminutive hyperplastic polyps in 2007. Recommended followup per previous provider around 2015.  Other Orders: EGD (EGD)  Patient Instructions: 1)  EGD [facility] 05/04/10 1:30 arrive at 12:30 pm on 4th floor 2)  Upper Endoscopy brochure given.  3)  Nexium Refill sent to pharmacy x 1 year supply 4)  Referral to Dr. Loraine Leriche will see you today. 5)  Copy sent to :  Illene Regulus, MD 6)  The medication list was reviewed and reconciled.  All changed / newly prescribed medications were explained.  A complete medication list was provided to the patient / caregiver. 7)  printed and given to patient.  Milford Cage [facility]  March 23, 2010 10:08 AM 8)  Copy: Dr. Edyth Gunnels, Dr. Illene Regulus Prescriptions: NEXIUM 40 MG  CPDR (ESOMEPRAZOLE MAGNESIUM) take one by mouth every three days as needed  #30 x 11   Entered by:   Milford Cage NCMA   Authorized by:   Hilarie Fredrickson MD   Signed by:   Milford Cage NCMA on 03/23/2010   Method used:   Electronically to        [facility] [address]* (retail)       [address]       [place], [place]  [address]       Ph: [phone] or [phone]       Fax: [phone]   RxID:   [id number]

## 2011-01-20 NOTE — Progress Notes (Signed)
  Phone Note Refill Request Message from:  Fax from Pharmacy on November 16, 2010 4:25 PM  Refills Requested: Medication #1:  LUNESTA 2 MG  TABS Take 1 tab by mouth at bedtime as needed fax from [facility] on [address], Please Advise refill  Initial call taken by: Ami Bullins CMA,  November 16, 2010 4:26 PM  Follow-up for Phone Call        [facility] as needed  Follow-up by: Jacques Navy MD,  November 16, 2010 5:26 PM  Additional Follow-up for Phone Call Additional follow up Details #1::        called into [facility] on [address] with #30 with refills as needed  Additional Follow-up by: Ami Bullins CMA,  November 17, 2010 11:40 AM    Prescriptions: LUNESTA 2 MG  TABS (ESZOPICLONE) Take 1 tab by mouth at bedtime as needed  #30 x 1   Entered by:   Ami Bullins CMA   Authorized by:   Jacques Navy MD   Signed by:   Bill Salinas CMA on 11/17/2010   Method used:     RxID:   [id number]

## 2011-01-20 NOTE — Assessment & Plan Note (Signed)
Summary: atypical chest pain per barb/dr perry(GI)cd   Vital Signs:  Patient profile:   65 year old female Height:      66 inches Weight:      109 pounds BMI:     17.66 O2 Sat:      97 % on Room air Temp:     97.1 degrees F oral Pulse rate:   61 / minute BP sitting:   120 / 82  (left arm) Cuff size:   regular  Vitals Entered By: Bill Salinas CMA (March 23, 2010 10:38 AM)  O2 Flow:  Room air CC: pt here with complaint of chest pain on occasions x 1 year, pt states her symptoms are relieved by taking a Zantac with a glass of water/ ab   Primary Care Provider:  Jacques Navy MD  CC:  pt here with complaint of chest pain on occasions x 1 year and pt states her symptoms are relieved by taking a Zantac with a glass of water/ ab.  History of Present Illness: Patient referred by Dr. Marina Goodell for cardiac evaluation. She has been having atypical chest pain: substernal in location; described as pressure or "clawing" discomfort. She does report radiation to her neck and arm. Duration is usually several minutes. She has no accompanying SOB. She is able to do all of her usual activities.  Cardiac Risks: post-menopausal, positive family history, h/o scarlet fever, hyperlipidemia.  Current Medications (verified): 1)  Simvastatin 20 Mg Tabs (Simvastatin) .... Take 1 Tablet By Mouth Once A Day 2)  Synthroid 88 Mcg Tabs (Levothyroxine Sodium) .... Take 1 Tablet By Mouth Once A Day 3)  Nexium 40 Mg  Cpdr (Esomeprazole Magnesium) .... Take One By Mouth Every Three Days As Needed 4)  Vagifem 10 Mcg Tabs (Estradiol) .... Insert One Vaginally  Twice A Week 5)  Lunesta 2 Mg  Tabs (Eszopiclone) .... Take 1 Tab By Mouth At Bedtime As Needed 6)  Multivitamins   Tabs (Multiple Vitamin) .... Take 1 Tablet By Mouth Once A Day 7)  Chewable Calcium 500-200-40 Mg-Unt-Mcg  Chew (Calcium-Vitamin D-Vitamin K) .... Take 3 Tablets By Mouth Once A Day 8)  Gas-X Extra Strength 125 Mg  Caps (Simethicone) .... As Directed  As Needed 9)  Alprazolam 0.25 Mg Tabs (Alprazolam) .... Take One By Mouth At Bedtime As Needed 10)  Zantac 150 Mg Tabs (Ranitidine Hcl) .... Take One By Mouth As Needed  Allergies (verified): 1)  ! Sulfa PMH-FH-SH reviewed-no changes except otherwise noted  Review of Systems       The patient complains of chest pain, abdominal pain, severe indigestion/heartburn, and depression.  The patient denies anorexia, fever, weight gain, decreased hearing, syncope, dyspnea on exertion, peripheral edema, headaches, melena, hematochezia, hematuria, incontinence, muscle weakness, difficulty walking, unusual weight change, and abnormal bleeding.    Physical Exam  General:  WNWD slender woman looking younger than her stated age. Head:  normocephalic and atraumatic.   Eyes:  corneas and lenses clear and no injection.   Neck:  supple.   Chest Wall:  no deformities.   Lungs:  normal respiratory effort and normal breath sounds.   Heart:  normal rate, regular rhythm, no murmur, and no JVD.   Abdomen:  soft and normal bowel sounds.  No tenderness to deep palpation in the epigastrum or lower abdomen Msk:  no joint tenderness, no joint swelling, and no joint deformities.   Pulses:  2+ radial Neurologic:  alert & oriented X3, cranial nerves II-XII  intact, and gait normal.   Skin:  turgor normal and color normal.   Cervical Nodes:  no anterior cervical adenopathy and no posterior cervical adenopathy.   Psych:  Oriented X3, memory intact for recent and remote, normally interactive, and good eye contact.     Impression & Recommendations:  Problem # 1:  CHEST PAIN, ATYPICAL (ICD-786.59)  Patient with atypical chest pain. She has multiple cardiac risk factors. Her gastroenterologist feels her symptoms exceed what he would expect from GI orgin. 12 Lead EKG with sinus bradycardia.  Plan - risk stratification with NST.  Orders: Cardiolite (Cardiolite) EKG w/ Interpretation (93000)  Problem # 2:  DEPRESSION,  SITUATIONAL, ACUTE (ICD-300.4) See 03/04/10 phone note. She reports that she has been taking xanax at bedtime. She never started sertraline.   Discussed the vegative signs of depression, the mechanism of action of SSRI medication along with tolerability and side-effects. She has a full understanding of the situation and treatment proposal.  Plan - she will consider her symptoms and make a decision in regard to starting SSRI therapy. I have asked that she contact me as to her decision, especially if she starts medication.   Complete Medication List: 1)  Simvastatin 20 Mg Tabs (Simvastatin) .... Take 1 tablet by mouth once a day 2)  Synthroid 88 Mcg Tabs (Levothyroxine sodium) .... Take 1 tablet by mouth once a day 3)  Nexium 40 Mg Cpdr (Esomeprazole magnesium) .... Take one by mouth every three days as needed 4)  Vagifem 10 Mcg Tabs (Estradiol) .... Insert one vaginally  twice a week 5)  Lunesta 2 Mg Tabs (Eszopiclone) .... Take 1 tab by mouth at bedtime as needed 6)  Multivitamins Tabs (Multiple vitamin) .... Take 1 tablet by mouth once a day 7)  Chewable Calcium 500-200-40 Mg-unt-mcg Chew (Calcium-vitamin d-vitamin k) .... Take 3 tablets by mouth once a day 8)  Gas-x Extra Strength 125 Mg Caps (Simethicone) .... As directed as needed 9)  Alprazolam 0.25 Mg Tabs (Alprazolam) .... Take one by mouth at bedtime as needed 10)  Zantac 150 Mg Tabs (Ranitidine hcl) .... Take one by mouth as needed   Preventive Care Screening  Mammogram:    Date:  01/19/2010    Results:  normal   Last Tetanus Booster:    Date:  09/19/2009    Results:  Historical

## 2011-01-20 NOTE — Procedures (Addendum)
Summary: Upper Endoscopy  Patient: [name] Note: All result statuses are Final unless otherwise noted.  Tests: (1) Upper Endoscopy (EGD)   EGD Upper Endoscopy       DONE     [facility]     [address]     [place], [place]  [address]           ENDOSCOPY PROCEDURE REPORT           PATIENT:  Dawn Tucker, Dawn Tucker  MR#:  [id number]     BIRTHDATE:  05-15-1946, 64 yrs. old  GENDER:  female           ENDOSCOPIST:  Wilhemina Bonito. Eda Keys, MD     Referred by:  Office           PROCEDURE DATE:  01/12/2011     PROCEDURE:  EGD, diagnostic 43235,     Maloney Dilation of Esophagus - 24     F     ASA CLASS:  Class II     INDICATIONS:  dysphagia           MEDICATIONS:   Fentanyl 75 mcg IV, Versed 7 mg IV     TOPICAL ANESTHETIC:  Exactacain Spray           DESCRIPTION OF PROCEDURE:   After the risks benefits and     alternatives of the procedure were thoroughly explained, informed     consent was obtained.  The [facility] [id number] endoscope was     introduced through the mouth and advanced to the second portion of     the duodenum, without limitations.  The instrument was slowly     withdrawn as the mucosa was fully examined.     <<PROCEDUREIMAGES>>           The upper, middle, and distal third of the esophagus were     carefully inspected and no abnormalities were noted. The z-line     was well seen at the GEJ.No obvious stricture appreciated.  The     endoscope was pushed into the fundus which was normal including a     retroflexed view. The antrum,gastric body, first and second part     of the duodenum were unremarkable.    Retroflexed views revealed a     hiatal hernia.    The scope was then withdrawn from the patient     and the procedure completed.           THERAPY: 87F MALONEY DILATOR PASSED W/O RESISTANCE OR HEME           COMPLICATIONS:  None           ENDOSCOPIC IMPRESSION:     1) Normal EGD - S/P EMPIRIC DILATION 87F     2) A hiatal hernia     3) GERD  RECOMMENDATIONS:     1) Clear liquids until 1PM, then soft foods rest of day. Resume     prior diet tomorrow.     2) Continue PPI (DEXILANT)     3) Call office next 2-3 days to schedule an office appointment     for 4 WEEKS           _____________________________     Wilhemina Bonito. Eda Keys, MD           CC:  Jacques Navy, MD; The Patient           n.     Rosalie DoctorJonny Ruiz  Lanelle Bal at 01/12/2011 10:49 AM           Mitchel Honour, [id number]  Note: An exclamation mark (!) indicates a result that was not dispersed into the flowsheet. Document Creation Date: 01/12/2011 10:49 AM _______________________________________________________________________  (1) Order result status: Final Collection or observation date-time: 01/12/2011 10:32 Requested date-time:  Receipt date-time:  Reported date-time:  Referring Physician:   Ordering Physician: Fransico Setters [phone]) Specimen Source:  Source: Launa Grill Order Number: [id number] Lab site:

## 2011-01-20 NOTE — Procedures (Signed)
Summary: Sanda Klein, MD   Colonoscopy  Procedure date:  01/24/2006  Findings:      Location:  [facility].  Results: Polyp.  Pathology:  Hyperplastic polyp.      Patient Name: Dawn Tucker, Dawn Tucker. MRN:  Procedure Procedures: Colonoscopy CPT: [phone].  Personnel: Endoscopist: Ulyess Mort, MD.  Exam Location: Exam performed in Outpatient Clinic. Outpatient  Patient Consent: Procedure, Alternatives, Risks and Benefits discussed, consent obtained, from patient. Consent was obtained by the RN.  Indications  Average Risk Screening Routine.  History  Current Medications: Patient is not currently taking Coumadin.  Pre-Exam Physical: Entire physical exam was normal.  Comments: Pt. history reviewed/updated, physical exam performed prior to initiation of sedation? Exam Exam: Extent of exam reached: Cecum, extent intended: Cecum.  The cecum was identified by appendiceal orifice and IC valve. Colon retroflexion performed. Images taken. ASA Classification: II. Tolerance: excellent.  Monitoring: Pulse and BP monitoring, Oximetry used. Supplemental O2 given.  Colon Prep Prep results: excellent.  Sedation Meds: Patient assessed and found to be appropriate for moderate (conscious) sedation. Fentanyl 75 mcg. given IV. Versed 10 mg. given IV.  Findings POLYP: Hepatic Flexure, Maximum size: 3 mm. sessile polyp. Procedure:  hot biopsy, removed, retrieved, Polyp sent to pathology. ICD9: Colon Polyps: 211.3.  - NOT SEEN ON EXAM: Cecum to Rectum. AVM's, Colitis, Tumors, Melanosis, Crohn's, Diverticulosis, Hemorrhoids,   Assessment Abnormal examination, see findings above.  Diagnoses: 211.3: Colon Polyps.   Events  Unplanned Interventions: No intervention was required.  Unplanned Events: There were no complications. Plans Medication Plan: Await pathology. Continue current medications.  Patient Education: Patient given standard instructions  for: Polyps. Yearly hemoccult testing recommended. Patient instructed to get routine colonoscopy every 8 years.  Disposition: After procedure patient sent to recovery. After recovery patient sent home.   This report was created from the original endoscopy report, which was reviewed and signed by the above listed endoscopist.   cc:  Illene Regulus, MD

## 2011-01-20 NOTE — Assessment & Plan Note (Signed)
Summary: Cardiology Nuclear Study  Nuclear Med Background Indications for Stress Test: Evaluation for Ischemia   History: Echo, GXT  History Comments:  ~10 yrs ago GXT:OK per patient; '01 Stress Echo:normal, EF=60%  Symptoms: Chest Tightness  Symptoms Comments: CP>neck. Last episode of CP:2 weeks ago.   Nuclear Pre-Procedure Cardiac Risk Factors: Family History - CAD, Lipids Caffeine/Decaff Intake: none NPO After: 8:00 PM Lungs: Clear IV 0.9% NS with Angio Cath: 20g     IV Site: (R) Forearm IV Started by: Stanton Kidney EMT-P Chest Size (in) 32     Cup Size D     Height (in): 63.5 Weight (lb): 109 BMI: 19.07  Nuclear Med Study 1 or 2 day study:  1 day     Stress Test Type:  Stress Reading MD:  Marca Ancona, MD     Referring MD:  Illene Regulus, MD Resting Radionuclide:  Technetium 22m Tetrofosmin     Resting Radionuclide Dose:  10.5 mCi  Stress Radionuclide:  Technetium 84m Tetrofosmin     Stress Radionuclide Dose:  33.0 mCi   Stress Protocol Exercise Time (min):  9:31 min     Max HR:  134 bpm     Predicted Max HR:  157 bpm  Max Systolic BP: 115 mm Hg     Percent Max HR:  85.35 %     METS: 10.3 Rate Pressure Product:  10272    Stress Test Technologist:  Rea College CMA-N     Nuclear Technologist:  Domenic Polite CNMT  Rest Procedure  Myocardial perfusion imaging was performed at rest 45 minutes following the intravenous administration of Myoview Technetium 88m Tetrofosmin.  Stress Procedure  The patient exercised for 9:31.  The patient stopped due to fatigue and denied any chest pain.  She did have a vagal response at peak exercise with the myoveiw injection..  There were ST-T wave changes with exercise.  Myoview was injected at peak exercise and myocardial perfusion imaging was performed after a brief delay.  QPS Raw Data Images:  Normal; no motion artifact; normal heart/lung ratio. Stress Images:  NI: Uniform and normal uptake of tracer in all myocardial  segments. Rest Images:  Normal homogeneous uptake in all areas of the myocardium. Subtraction (SDS):  There is no evidence of scar or ischemia. Transient Ischemic Dilatation:  .99  (Normal <1.22)  Lung/Heart Ratio:  .30  (Normal <0.45)  Quantitative Gated Spect Images QGS EDV:  56 ml QGS ESV:  15 ml QGS EF:  74 % QGS cine images:  Normal wall motion.    Overall Impression  Exercise Capacity: Good exercise capacity. BP Response: Hypotensive blood pressure response. ? vagal response to myoview injection.  Clinical Symptoms: Fatigue.  ECG Impression: Insignificant upsloping ST segment depression. Overall Impression: Normal stress nuclear study.

## 2011-01-20 NOTE — Assessment & Plan Note (Signed)
Summary: GERD and dysphagia    History of Present Illness Visit Type: Follow-up Visit Primary GI MD: Yancey Flemings MD Primary Provider: Jacques Navy MD Chief Complaint: Dysphagia and a little GERD  History of Present Illness:   a 64 year old female with a history of hypothyroidism, dyslipidemia, constipation predominant irritable bowel syndrome, and GERD. I last saw her in April 2011 with a chief complaint of chest tightness with meals. She also complained of some intermittent solid food dysphagia. She was placed on Nexium 40 mg daily and underwent upper endoscopy on May 04, 2004. The examination revealed a large caliber distal esophageal stricture which was dilated to 54 [place]. No other abnormalities. She has not been seen since. She told me that she is doing fairly well until recently developing what she thinks is a viral gastroenteritis. Since then worsening reflux symptoms. The feeding she will notice some solid food dysphasia. But liquids occasional cough. Gurgling sensation in her sounds in the esophagus. Despite once daily Nexium therapy, she does have breakthrough pyrosis several times per week. Some nausea. She needs to chew her food carefully are covered up carefully otherwise significant dysphagia. She is not certain that last years dilation was helpful.   GI Review of Systems    Reports dysphagia with solids.      Denies abdominal pain, acid reflux, belching, bloating, chest pain, dysphagia with liquids, heartburn, loss of appetite, nausea, vomiting, vomiting blood, weight loss, and  weight gain.        Denies anal fissure, black tarry stools, change in bowel habit, constipation, diarrhea, diverticulosis, fecal incontinence, heme positive stool, hemorrhoids, irritable bowel syndrome, jaundice, light color stool, liver problems, rectal bleeding, and  rectal pain.    Current Medications (verified): 1)  Simvastatin 20 Mg Tabs (Simvastatin) .... Take 1 Tablet By Mouth Once A Day 2)   Synthroid 88 Mcg Tabs (Levothyroxine Sodium) .... Take 1 Tablet By Mouth Once A Day 3)  Nexium 40 Mg  Cpdr (Esomeprazole Magnesium) .... Take One By Mouth Qd 4)  Vagifem 10 Mcg Tabs (Estradiol) .... Insert One Vaginally  Twice A Week 5)  Lunesta 2 Mg  Tabs (Eszopiclone) .... Take 1 Tab By Mouth At Bedtime As Needed 6)  Multivitamins   Tabs (Multiple Vitamin) .... Take 1 Tablet By Mouth Once A Day 7)  Chewable Calcium 500-200-40 Mg-Unt-Mcg  Chew (Calcium-Vitamin D-Vitamin K) .... Take 3 Tablets By Mouth Once A Day 8)  Gas-X Extra Strength 125 Mg  Caps (Simethicone) .... As Directed As Needed 9)  Zantac 150 Mg Tabs (Ranitidine Hcl) .... Take One By Mouth As Needed  Allergies (verified): 1)  ! Sulfa  Past History:  Past Medical History: UCD ESOPHAGEAL STRICTURE (ICD-530.3)--EGD-- 05/04/2010 HIATAL HERNIA (ICD-553.3) PERSONAL HX COLONIC POLYPS (ICD-V12.72)--Hyperplastic  CHEST PAIN (ICD-786.50) DYSPHAGIA UNSPECIFIED (ICD-787.20) GERD (ICD-530.81) HEALTHY ADULT FEMALE (ICD-V70.0) OSTEOPENIA (ICD-733.90) VAGINITIS, ATROPHIC (ICD-627.3) Hx of SCARLET FEVER (ICD-034.1) UNSPECIFIED PRURITIC DISORDER (ICD-698.9) IRRITABLE BOWEL SYNDROME, HX OF (ICD-V12.79) DEGENERATIVE DISC DISEASE (ICD-722.6) MIXED HYPERLIPIDEMIA (ICD-272.2) HYPOTHYROIDISM (ICD-244.9) FIBROIDS, UTERUS (ICD-218.9)  Physician Roster:                  Gyn - Dr. Basilio Cairo - Dr. Kelli Churn - Dr. Danella Deis  Opthal - Dr. Nile Riggs                  GI -     Dr. Marina Goodell  Past Surgical History: Reviewed history from 03/18/2010 and no changes required. Rhinoplasty Breast biopsies Right and left - benign lesions C-section x 1 G2P2  Family History: Reviewed history from 03/23/2010 and no changes required. father - deceased @ 11: CAD/MI mother- deceased @ 57: fatal pneumonia P Aunt - breast cancer Neg - colon cancer, DM Family History of Prostate Cancer: maternal  grandfather  Social History: Reviewed history from 03/23/2010 and no changes required. Lennie Hummer married '68 1 son - '79, 1 daughter - '71; 2 grandchildren Marriage is in good health Very active in community affairs. Alcohol Use - yes occasional  Daily Caffeine Use occasional  Illicit Drug Use - no  Review of Systems       The patient complains of cough.  The patient denies allergy/sinus, anemia, anxiety-new, arthritis/joint pain, back pain, blood in urine, breast changes/lumps, change in vision, confusion, coughing up blood, depression-new, fainting, fatigue, fever, headaches-new, hearing problems, heart murmur, heart rhythm changes, itching, menstrual pain, muscle pains/cramps, night sweats, nosebleeds, pregnancy symptoms, shortness of breath, skin rash, sleeping problems, sore throat, swelling of feet/legs, swollen lymph glands, thirst - excessive , urination - excessive , urination changes/pain, urine leakage, vision changes, and voice change.    Vital Signs:  Patient profile:   65 year old female Height:      63.5 inches Weight:      109 pounds BMI:     19.07 BSA:     1.50 Pulse rate:   60 / minute Pulse rhythm:   regular BP sitting:   116 / 64  (left arm) Cuff size:   regular  Vitals Entered By: Ok Anis CMA (January 10, 2011 2:51 PM)  Physical Exam  General:  Well developed, well nourished, no acute distress. Head:  Normocephalic and atraumatic. Eyes:  PERRLA, no icterus. Mouth:  No deformity or lesions, dentition normal. Neck:  Supple; no masses or thyromegaly. Lungs:  Clear throughout to auscultation. Heart:  Regular rate and rhythm; no murmurs, rubs,  or bruits. Abdomen:  Soft, nontender and nondistended. No masses, hepatosplenomegaly or hernias noted. Normal bowel sounds. Pulses:  Normal pulses noted. Neurologic:  Alert and  oriented x4. Skin:  Intact without significant lesions or rashes. Psych:  Alert and cooperative. Normal mood and  affect.   Impression & Recommendations:  Problem # 1:  GERD (ICD-530.81) GERD with breakthrough symptoms and dysphagia  Plan: #1. Reflux precautions #2. Change from Nexium to Dexilant 60 mg daily. 40 days of samples provided  Problem # 2:  DYSPHAGIA UNSPECIFIED (ICD-787.20) worsening intermittent solid food dysphagia  Plan: #1. Change PPI as discussed #2. Upper endoscopy with esophageal dilation. The nature of the procedure as well as the risks, benefits, and alternatives were reviewed. She understood and agreed to proceed  Problem # 3:  PERSONAL HX COLONIC POLYPS (ICD-V12.72) hyperplastic only. Due for followup around 2015  Other Orders: EGD SAV (EGD SAV)  Patient Instructions: 1)  EGD [facility] 01/12/11 9:30 am arrive at 8:30 am on 4th floor. 2)  Upper Endoscopy with Dilatation brochure given.  3)  Dexilant samples given to take 1 by mouth once daily 30 minutes prior to breakfast. 4)  Copy sent to : Jacques Navy MD 5)  The medication list was reviewed and reconciled.  All changed / newly prescribed medications were explained.  A complete medication list was provided to the patient / caregiver.

## 2011-01-20 NOTE — Assessment & Plan Note (Signed)
Summary: cpx/cigna/#/cd   Vital Signs:  Patient profile:   65 year old female Height:      63.5 inches (161.29 cm) Weight:      110.50 pounds (50.23 kg) BMI:     19.34 O2 Sat:      98 % on Room air Temp:     97.5 degrees F (36.39 degrees C) oral Pulse rate:   68 / minute BP sitting:   120 / 80  (left arm) Cuff size:   regular  Vitals Entered By: Lucious Groves CMA (July 28, 2010 10:03 AM)  O2 Flow:  Room air CC: CPX./kb Is Patient Diabetic? No Pain Assessment Patient in pain? no      Comments Patient notes that she did have nuclear stress test in May./kb   Primary Care Provider:  Jacques Navy MD  CC:  CPX./kb.  History of Present Illness: Ms. Fragoso presents for routine medical follow-up. In the interval since her last visit she has had a normal nuclear cardiac stress test; she has had EGD with dilation of esophageal stricture-but she continues to have mild dysphagia; she has seen Dr. Danella Deis and had a benign lesion excised. She has had a bone density study which did reveal, by her report, mild loss of bone density particularly at the right hip. She had completed 5 years of treatment with Fosamax. Dr. Eda Paschal did check for familial hypercalciuria and parathyroid disease. She has had normal gyn exam, breast exam by Dr. Eda Paschal and she reports that her mammogram was normal.   She reports that she is feeling well physically. She continues to use alprazolam 0.25mg  at bedtime to promote more restfull sleep.   Current Medications (verified): 1)  Simvastatin 20 Mg Tabs (Simvastatin) .... Take 1 Tablet By Mouth Once A Day 2)  Synthroid 88 Mcg Tabs (Levothyroxine Sodium) .... Take 1 Tablet By Mouth Once A Day 3)  Nexium 40 Mg  Cpdr (Esomeprazole Magnesium) .... Take One By Mouth Qd 4)  Vagifem 10 Mcg Tabs (Estradiol) .... Insert One Vaginally  Twice A Week 5)  Lunesta 2 Mg  Tabs (Eszopiclone) .... Take 1 Tab By Mouth At Bedtime As Needed 6)  Multivitamins   Tabs (Multiple  Vitamin) .... Take 1 Tablet By Mouth Once A Day 7)  Chewable Calcium 500-200-40 Mg-Unt-Mcg  Chew (Calcium-Vitamin D-Vitamin K) .... Take 3 Tablets By Mouth Once A Day 8)  Gas-X Extra Strength 125 Mg  Caps (Simethicone) .... As Directed As Needed 9)  Alprazolam 0.25 Mg Tabs (Alprazolam) .... Take One By Mouth At Bedtime As Needed 10)  Zantac 150 Mg Tabs (Ranitidine Hcl) .... Take One By Mouth As Needed 11)  Valtrex 1 Gm Tabs (Valacyclovir Hcl) .... By Mouth As Directed As Needed  Allergies (verified): 1)  ! Sulfa  Past History:  Past Surgical History: Last updated: 03/18/2010 Rhinoplasty Breast biopsies Right and left - benign lesions C-section x 1 G2P2  Family History: Last updated: 04-01-2010 father - deceased @ 20: CAD/MI mother- deceased @ 89: fatal pneumonia P Aunt - breast cancer Neg - colon cancer, DM Family History of Prostate Cancer: maternal grandfather  Social History: Last updated: Apr 01, 2010 Lennie Hummer married '68 1 son - '79, 1 daughter - '71; 2 grandchildren Marriage is in good health Very active in community affairs. Alcohol Use - yes occasional  Daily Caffeine Use occasional  Illicit Drug Use - no  Risk Factors: Caffeine Use: 1 (08/06/2008) Exercise: yes (08/06/2008)  Risk Factors: Smoking Status: never (08/06/2008) Passive  Smoke Exposure: no (08/06/2008)  Past Medical History: UCD OSTEOPENIA (ICD-733.90) VAGINITIS, ATROPHIC (ICD-627.3) Hx of SCARLET FEVER (ICD-034.1) UNSPECIFIED PRURITIC DISORDER (ICD-698.9) IRRITABLE BOWEL SYNDROME, HX OF (ICD-V12.79) DEGENERATIVE DISC DISEASE (ICD-722.6) MIXED HYPERLIPIDEMIA (ICD-272.2) HYPOTHYROIDISM (ICD-244.9) FIBROIDS, UTERUS (ICD-218.9) Hiatal Hernia Hyperplastic Polyps GERD   Physician Roster:                  Gyn - Dr. Eda Paschal                  Ortho - Dr. Kelli Churn - Dr. Othella Boyer - Dr. Nile Riggs                  GI -     Dr. Marina Goodell  Review of  Systems  The patient denies anorexia, fever, weight loss, weight gain, decreased hearing, chest pain, syncope, dyspnea on exertion, prolonged cough, abdominal pain, hematuria, genital sores, muscle weakness, difficulty walking, abnormal bleeding, enlarged lymph nodes, angioedema, and breast masses.    Physical Exam  General:  WNWD white female in no distress Head:  normocephalic, atraumatic, and no abnormalities observed.   Eyes:  vision grossly intact, pupils equal, pupils round, corneas and lenses clear, and no injection.   C&S clear Ears:  External ear exam shows no significant lesions or deformities.  Otoscopic examination reveals clear canals, tympanic membranes are intact bilaterally without bulging, retraction, inflammation or discharge. Hearing is grossly normal bilaterally. Nose:  no external deformity and no external erythema.   Mouth:  Oral mucosa and oropharynx without lesions or exudates.  Teeth in good repair. Neck:  supple, full ROM, no thyromegaly, no carotid bruits, and no cervical lymphadenopathy.   Chest Wall:  No deformities, masses, or tenderness noted. Breasts:  deferred to Gyn Lungs:  normal respiratory effort, normal breath sounds, no crackles, and no wheezes.   Heart:  Normal rate and regular rhythm. S1 and S2 normal without gallop, murmur, click, rub or other extra sounds. Abdomen:  soft, non-tender, normal bowel sounds, no guarding, no rigidity, and no hepatomegaly.   Genitalia:  deferred to Gyn Msk:  normal ROM, no joint tenderness, no joint swelling, no joint warmth, and no redness over joints.   Pulses:  2+ radial, Dorsalis pedis and posterior tibial pusles Extremities:  No clubbing, cyanosis, edema, or deformity noted with normal full range of motion of all joints.   Neurologic:  alert & oriented X3, cranial nerves II-XII intact, strength normal in all extremities, gait normal, and DTRs symmetrical and normal.   Skin:  turgor normal, color normal, no rashes, no  suspicious lesions, and no ulcerations.   Cervical Nodes:  no anterior cervical adenopathy and no posterior cervical adenopathy.   Psych:  Oriented X3, memory intact for recent and remote, normally interactive, good eye contact, and not anxious appearing.     Impression & Recommendations:  Problem # 1:  CHEST PAIN (ICD-786.50) Patient has had a thorough evaluation with a normal nuclear stress test. NIH Fr[place] data based cardiac risk calcuation reveals a 1% risk of a cardiac event over the next 10 years = very low risk.  Plan - no further diagnostic testing          continue risk reduction  Problem # 2:  DYSPHAGIA UNSPECIFIED (ICD-787.20) History of esophageal stricture with dilation in  May '11. She reports her symptoms never dramatically improved and she continues to have mild symptoms. She denies any choking episodes.  Plan - continue PPI therapy           for progressive symptoms she knows to contact Dr. Marina Goodell  Problem # 3:  OSTEOPENIA (ICD-733.90) By report she has had some progress of bone loss but is not at the level of osteoporosis. Dr. Eda Paschal has evaluated for other bone related metabolic disorders. She reports that she did try Evista years ago and hot marked hot flashes and side effects.  Plan - life-style management with calcium 1200mg /day (diet plus supplement) and Vit D [phone] international units daily          Follow-up DXA scan in 2 years.  Problem # 4:  MIXED HYPERLIPIDEMIA (ICD-272.2)  Her updated medication list for this problem includes:    Simvastatin 20 Mg Tabs (Simvastatin) .Marland Kitchen... Take 1 tablet by mouth once a day  Labs Reviewed: SGOT: 54 (07/16/2010)   SGPT: 62 (07/16/2010)   HDL:60.80 (07/16/2010), 59.10 (04/29/2009)  LDL:94 (07/16/2010), 99 (04/29/2009)  Chol:172 (07/16/2010), 174 (04/29/2009)  Trig:85.0 (07/16/2010), 82.0 (04/29/2009)  Excellant control on low dose simvastatin. She does have mildly elevated transaminases:                                             04/29/09        06/25/09             07/16/10                AST                      46                 33                  54                ALT                       50                33                  62  Plan - continue simvastatin           repeat liver functions in 3 months.  Problem # 5:  HYPOTHYROIDISM (ICD-244.9)  Her updated medication list for this problem includes:    Synthroid 88 Mcg Tabs (Levothyroxine sodium) .Marland Kitchen... Take 1 tablet by mouth once a day  Labs Reviewed: TSH: 1.97 (07/16/2010)     Good control on present dose of synthroid.  Plan - continue present medications.  Problem # 6:  Preventive Health Care (ICD-V70.0) Interval history as noted and without significant illness, injury or surgery except for EGD. Physical exam is normal. Lab results are in normal limits except for mild elevation in tranaminases. she is current with colorectal cancer screening with last colonoscopy Feb '07. She is current with mammography. Immunizations for tetnus ('10) and Zostavax ("07) are up-to-date and she is given pneumovax today. She is current with her opthalmologist and has no problems.  It has been a tumultuous year complicated recently with her husband having a flare of colitits, esophagitis and  very recenty having a PE after a trip to [place]. She does not feel that she has undue anxiety, however she does find that a PM dose of alprazolam is helpful for sleep. She is aware of the low risk of complications from medication.  In summary - a very pleasant woman who is medically stable. She will continue all present medications. She will return as needed or 1 year.   Complete Medication List: 1)  Simvastatin 20 Mg Tabs (Simvastatin) .... Take 1 tablet by mouth once a day 2)  Synthroid 88 Mcg Tabs (Levothyroxine sodium) .... Take 1 tablet by mouth once a day 3)  Nexium 40 Mg Cpdr (Esomeprazole magnesium) .... Take one by mouth qd 4)  Vagifem 10 Mcg Tabs (Estradiol) ....  Insert one vaginally  twice a week 5)  Lunesta 2 Mg Tabs (Eszopiclone) .... Take 1 tab by mouth at bedtime as needed 6)  Multivitamins Tabs (Multiple vitamin) .... Take 1 tablet by mouth once a day 7)  Chewable Calcium 500-200-40 Mg-unt-mcg Chew (Calcium-vitamin d-vitamin k) .... Take 3 tablets by mouth once a day 8)  Gas-x Extra Strength 125 Mg Caps (Simethicone) .... As directed as needed 9)  Alprazolam 0.25 Mg Tabs (Alprazolam) .... Take one by mouth at bedtime as needed 10)  Zantac 150 Mg Tabs (Ranitidine hcl) .... Take one by mouth as needed 11)  Valtrex 1 Gm Tabs (Valacyclovir hcl) .... By mouth as directed as needed  Other Orders: Pneumococcal Vaccine (16109) Admin 1st Vaccine (60454)  Charnese Nottingham Note: All result statuses are Final unless otherwise noted.  Tests: (1) Lipid Panel (LIPID)   Cholesterol               172 mg/dL                   0-981     ATP III Classification            Desirable:  < 200 mg/dL                    Borderline High:  200 - 239 mg/dL               High:  > = 240 mg/dL   Triglycerides             85.0 mg/dL                  1.9-147.8     Normal:  <150 mg/dL     Borderline High:  295 - 199 mg/dL   HDL                       62.13 mg/dL                 >08.65   VLDL Cholesterol          17.0 mg/dL                  7.8-46.9   LDL Cholesterol           94 mg/dL                    6-29  CHO/HDL Ratio:  CHD Risk                             3  Men          Women     1/2 Average Risk     3.4          3.3     Average Risk          5.0          4.4     2X Average Risk          9.6          7.1     3X Average Risk          15.0          11.0                           Tests: (2) BMP (METABOL)   Sodium                    135 mEq/L                   135-145   Potassium                 4.6 mEq/L                   3.5-5.1   Chloride                  101 mEq/L                   96-112   Carbon Dioxide            31 mEq/L                     19-32   Glucose                   85 mg/dL                    28-41   BUN                       21 mg/dL                    3-24   Creatinine                1.1 mg/dL                   4.0-1.0   Calcium                   9.0 mg/dL                   2.7-25.3   GFR                       52.10 mL/min                >60  Tests: (3) CBC Platelet w/Diff (CBCD)   White Cell Count          5.5 K/uL                    4.5-10.5   Red Cell Count            4.09 Mil/uL                 3.87-5.11   Hemoglobin  12.5 g/dL                   91.4-78.2   Hematocrit                37.0 %                      36.0-46.0   MCV                       90.5 fl                     78.0-100.0   MCHC                      33.8 g/dL                   95.6-21.3   RDW                       13.1 %                      11.5-14.6   Platelet Count            204.0 K/uL                  150.0-400.0   Neutrophil %              54.1 %                      43.0-77.0   Lymphocyte %              31.9 %                      12.0-46.0   Monocyte %                10.3 %                      3.0-12.0   Eosinophils%              3.0 %                       0.0-5.0   Basophils %               0.7 %                       0.0-3.0   Neutrophill Absolute      3.0 K/uL                    1.4-7.7   Lymphocyte Absolute       1.7 K/uL                    0.7-4.0   Monocyte Absolute         0.6 K/uL                    0.1-1.0  Eosinophils, Absolute                             0.2 K/uL                    0.0-0.7  Basophils Absolute        0.0 K/uL                    0.0-0.1  Tests: (4) Hepatic/Liver Function Panel (HEPATIC)   Total Bilirubin           0.7 mg/dL                   9.6-0.4   Direct Bilirubin          0.1 mg/dL                   5.4-0.9   Alkaline Phosphatase      60 U/L                      39-117   AST                  [H]  54 U/L                      0-37   ALT                  [H]  62 U/L                       0-35   Total Protein             6.7 g/dL                    8.1-1.9   Albumin                   4.1 g/dL                    1.4-7.8  Tests: (5) TSH (TSH)   FastTSH                   1.97 uIU/mL                 0.35-5.50  Tests: (6) UDip Only (UDIP)   Color                     LT. YELLOW       RANGE:  Yellow;Lt. Yellow   Clarity                   CLEAR                       Clear   Specific Gravity          1.010                       1.000 - 1.030   Urine Ph                  7.0                         5.0-8.0   Protein                   NEGATIVE                    Negative   Urine Glucose             NEGATIVE  Negative   Ketones                   NEGATIVE                    Negative   Urine Bilirubin           NEGATIVE                    Negative   Blood                     NEGATIVE                    Negative   Urobilinogen              0.2                         0.0 - 1.0   Leukocyte Esterace        NEGATIVE                    Negative   Nitrite                   NEGATIVE                    Negative    Immunization History:  Zostavax History:    Zostavax # 1:  zostavax (09/14/2006)    Zostavax # 1:  zostavax (09/14/2006)  Immunizations Administered:  Pneumonia Vaccine:    Vaccine Type: Pneumovax    Site: left deltoid    Mfr: Merck    Dose: 0.5 ml    Route: IM    Given by: Rock Nephew CMA    Exp. Date: 01/05/2012    Lot #: [id number]    VIS given: 07/16/96 version given July 28, 2010.

## 2011-01-20 NOTE — Letter (Signed)
Summary: New Patient letter  [facility]  [address] [place], [place] [address]   Phone: [phone]  Fax: [phone]       02/08/2010 MRN: [id number]  Dawn Tucker Dawn Tucker [address] [place], [place]  [address]  Dear Dawn Tucker,  Welcome to the Gastroenterology Division at [facility].    You are scheduled to see Dr. Marina Goodell on 03-23-10 at 9:30a.m. on the 3rd floor at [facility], [address]  We ask that you try to arrive at our office 15 minutes prior to your appointment time to allow for check-in.  We would like you to complete the enclosed self-administered evaluation form prior to your visit and bring it with you on the day of your appointment.  We will review it with you.  Also, please bring a complete list of all your medications or, if you prefer, bring the medication bottles and we will list them.  Please bring your insurance card so that we may make a copy of it.  If your insurance requires a referral to see a specialist, please bring your referral form from your primary care physician.  Co-payments are due at the time of your visit and may be paid by cash, check or credit card.     Your office visit will consist of a consult with your physician (includes a physical exam), any laboratory testing he/she may order, scheduling of any necessary diagnostic testing (e.g. x-ray, ultrasound, CT-scan), and scheduling of a procedure (e.g. Endoscopy, Colonoscopy) if required.  Please allow enough time on your schedule to allow for any/all of these possibilities.    If you cannot keep your appointment, please call [phone] to cancel or reschedule prior to your appointment date.  This allows [place] the opportunity to schedule an appointment for another patient in need of care.  If you do not cancel or reschedule by 5 p.m. the business day prior to your appointment date, you will be charged a $50.00 late cancellation/no-show fee.    Thank you for  choosing [facility] for your medical needs.  We appreciate the opportunity to care for you.  Please visit [place] at our website  to learn more about our practice.                     Sincerely,                                                             The Gastroenterology Division

## 2011-01-20 NOTE — Progress Notes (Signed)
----   Converted from flag ---- ---- 03/24/2010 3:08 PM, Edman Circle wrote: appt 4/12 @ 9:15 NPO 4 hours no smoking or caffeine 12 hours  ---- 03/24/2010 11:50 AM, Dagoberto Reef wrote: Thanks ------------------------------

## 2011-02-02 ENCOUNTER — Ambulatory Visit (INDEPENDENT_AMBULATORY_CARE_PROVIDER_SITE_OTHER): Payer: PRIVATE HEALTH INSURANCE | Admitting: Gynecology

## 2011-02-02 DIAGNOSIS — L293 Anogenital pruritus, unspecified: Secondary | ICD-10-CM

## 2011-02-02 DIAGNOSIS — B373 Candidiasis of vulva and vagina: Secondary | ICD-10-CM

## 2011-02-02 DIAGNOSIS — N898 Other specified noninflammatory disorders of vagina: Secondary | ICD-10-CM

## 2011-02-15 ENCOUNTER — Ambulatory Visit
Admission: RE | Admit: 2011-02-15 | Discharge: 2011-02-15 | Disposition: A | Discharge status: Home / Self Care | Payer: PRIVATE HEALTH INSURANCE | Source: Ambulatory Visit | Attending: Obstetrics and Gynecology | Admitting: Obstetrics and Gynecology

## 2011-02-15 DIAGNOSIS — Z1239 Encounter for other screening for malignant neoplasm of breast: Secondary | ICD-10-CM

## 2011-02-18 ENCOUNTER — Ambulatory Visit (INDEPENDENT_AMBULATORY_CARE_PROVIDER_SITE_OTHER): Payer: PRIVATE HEALTH INSURANCE | Admitting: Internal Medicine

## 2011-02-18 ENCOUNTER — Encounter: Payer: Self-pay | Admitting: Internal Medicine

## 2011-02-18 DIAGNOSIS — K219 Gastro-esophageal reflux disease without esophagitis: Secondary | ICD-10-CM

## 2011-02-18 DIAGNOSIS — R1310 Dysphagia, unspecified: Secondary | ICD-10-CM

## 2011-02-24 NOTE — Assessment & Plan Note (Signed)
Summary: F/U from procedure.      Allergies Added:   History of Present Illness Visit Type: follow up Primary GI MD: Yancey Flemings MD Primary Provider: Jacques Navy MD Chief Complaint: F/u Endoscopy procedure and dysphagia. Pt has occasional dysphagia with mostly bread but doing a lot better.  History of Present Illness:    65 year old female with a history of hypothyroidism, dyslipidemia , constipation predominant irritable bowel syndrome, and GERD. last evaluated January 10, 2011 for breakthrough reflux symptoms and solid food dysphasia. see that dictation. her PPI was changed from Nexium 40 mg daily to Dexilant  60 mg daily. She then underwent upper endoscopy on January 25. Examination was normal. She was empirically dilated with a 56 [place] Maloney dilator. She presents now for followup. First, she states that the reflux symptoms have resolved. No greater pyrosis. Tolerating the medication well. Next, she states that her dysphagia significantly better. Does notice slight problems with red , but otherwise doing okay. No other issues.   GI Review of Systems      Denies abdominal pain, acid reflux, belching, bloating, chest pain, dysphagia with liquids, dysphagia with solids, heartburn, loss of appetite, nausea, vomiting, vomiting blood, weight loss, and  weight gain.        Denies anal fissure, black tarry stools, change in bowel habit, constipation, diarrhea, diverticulosis, fecal incontinence, heme positive stool, hemorrhoids, irritable bowel syndrome, jaundice, light color stool, liver problems, rectal bleeding, and  rectal pain.    Current Medications (verified): 1)  Simvastatin 20 Mg Tabs (Simvastatin) .... Take 1 Tablet By Mouth Once A Day 2)  Synthroid 88 Mcg Tabs (Levothyroxine Sodium) .... Take 1 Tablet By Mouth Once A Day 3)  Vagifem 10 Mcg Tabs (Estradiol) .... Insert One Vaginally  Twice A Week 4)  Lunesta 2 Mg  Tabs (Eszopiclone) .... Take 1 Tab By Mouth At Bedtime As  Needed 5)  Multivitamins   Tabs (Multiple Vitamin) .... Take 1 Tablet By Mouth Once A Day 6)  Chewable Calcium 500-200-40 Mg-Unt-Mcg  Chew (Calcium-Vitamin D-Vitamin K) .... Take 3 Tablets By Mouth Once A Day 7)  Gas-X Extra Strength 125 Mg  Caps (Simethicone) .... As Directed As Needed 8)  Zantac 150 Mg Tabs (Ranitidine Hcl) .... Take One By Mouth As Needed 9)  Dexilant 60 Mg Cpdr (Dexlansoprazole) .Marland Kitchen.. 1 By Mouth Once Daily  Allergies (verified): 1)  ! Sulfa  Past History:  Past Medical History: Reviewed history from 01/10/2011 and no changes required. UCD ESOPHAGEAL STRICTURE (ICD-530.3)--EGD-- 05/04/2010 HIATAL HERNIA (ICD-553.3) PERSONAL HX COLONIC POLYPS (ICD-V12.72)--Hyperplastic  CHEST PAIN (ICD-786.50) DYSPHAGIA UNSPECIFIED (ICD-787.20) GERD (ICD-530.81) HEALTHY ADULT FEMALE (ICD-V70.0) OSTEOPENIA (ICD-733.90) VAGINITIS, ATROPHIC (ICD-627.3) Hx of SCARLET FEVER (ICD-034.1) UNSPECIFIED PRURITIC DISORDER (ICD-698.9) IRRITABLE BOWEL SYNDROME, HX OF (ICD-V12.79) DEGENERATIVE DISC DISEASE (ICD-722.6) MIXED HYPERLIPIDEMIA (ICD-272.2) HYPOTHYROIDISM (ICD-244.9) FIBROIDS, UTERUS (ICD-218.9)  Physician Roster:                  Gyn - Dr. Basilio Cairo - Dr. Kelli Churn - Dr. Othella Boyer - Dr. Nile Riggs                  GI -     Dr. Marina Goodell  Past Surgical  History: Reviewed history from 03/18/2010 and no changes required. Rhinoplasty Breast biopsies Right and left - benign lesions C-section x 1 G2P2  Family History: Reviewed history from 03/23/2010 and no changes required. father - deceased @ 76: CAD/MI mother- deceased @ 42: fatal pneumonia P Aunt - breast cancer Neg - colon cancer, DM Family History of Prostate Cancer: maternal grandfather  Social History: Reviewed history from 03/23/2010 and no changes required. Lennie Hummer married '68 1 son - '79, 1 daughter - '71; 2 grandchildren Marriage is in good  health Very active in community affairs. Alcohol Use - yes occasional  Daily Caffeine Use occasional  Illicit Drug Use - no  Review of Systems  The patient denies allergy/sinus, anemia, anxiety-new, arthritis/joint pain, back pain, blood in urine, breast changes/lumps, change in vision, confusion, cough, coughing up blood, depression-new, fainting, fatigue, fever, headaches-new, hearing problems, heart murmur, heart rhythm changes, itching, menstrual pain, muscle pains/cramps, night sweats, nosebleeds, pregnancy symptoms, shortness of breath, skin rash, sleeping problems, sore throat, swelling of feet/legs, swollen lymph glands, thirst - excessive , urination - excessive , urination changes/pain, urine leakage, vision changes, and voice change.    Vital Signs:  Patient profile:   65 year old female Height:      63.5 inches Weight:      110.25 pounds BMI:     19.29 Pulse rate:   64 / minute Pulse rhythm:   regular BP sitting:   116 / 66  (left arm) Cuff size:   regular  Vitals Entered By: Christie Nottingham CMA Duncan Dull) (February 18, 2011 2:51 PM)  Physical Exam  General:  Well developed, well nourished, no acute distress. Abdomen:   not reexamined Pulses:  Normal pulses noted. Neurologic:  Alert and  oriented x4 Psych:  Alert and cooperative. Normal mood and affect.   Impression & Recommendations:  Problem # 1:  GERD (ICD-530.81)  resolution of reflux symptoms after changing from Nexium to Dexilant   Plan : #1. Reflux precautions #2. prescribed Dexilant 60 mg daily  #3. followup as needed  Problem # 2:  DYSPHAGIA UNSPECIFIED (ICD-787.20)  normal recent endoscopy. Status post in. Dilation with a 31 French dilator. seems to have improved. recommend continued observation moving forward. should she develop recurrent dysphagia, consider barium esophagram with tablet and or repeat dilation, possibly with larger dilator.  Patient Instructions: 1)  Samples of Dexilant given. 2)   Prescription sent to [facility] for Dexilant for you to pick up. 3)  Copy sent to : Jacques Navy MD 4)  The medication list was reviewed and reconciled.  All changed / newly prescribed medications were explained.  A complete medication list was provided to the patient / caregiver. Prescriptions: DEXILANT 60 MG CPDR (DEXLANSOPRAZOLE) 1 by mouth once daily  #30 x 11   Entered by:   Milford Cage NCMA   Authorized by:   Hilarie Fredrickson MD   Signed by:   Milford Cage NCMA on 02/18/2011   Method used:   Electronically to        [id number] (retail)       [address]       [place], [place]  [address]       Ph: [phone]       Fax: [phone]   RxID:   [id number]

## 2011-05-06 NOTE — Assessment & Plan Note (Signed)
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  The patient did have a C-spine  series at that time recommended but no x-rays were obtained.  The patient's  symptoms resolved.  Her last full physical exam was August 09, 2004, please  see that dictation.  In the interval, she did see Dr. Londell Moh.  Her back  pain has been significantly improved.  The patient's low back pain with  known bulging disc is improved.  The patient continues to have some mild  swelling in her hands and did have her ring size increased, but this has  been a stable problem.  The patient has had good results with Lunesta in  regards to chronic insomnia.  She is tolerating this medication well and  taking it on a regular basis.  Her IBS is stable.   PAST MEDICAL HISTORY:  Well documented in my note of August 09, 2004, with  no changes, in addition to the current problems above.   PHYSICIAN ROSTER:  Endocrine, Dr. Everardo All.  Gynecology, Dr. Eda Paschal.  Orthopedics, Dr. Thurston Hole.  Dermatology, Dr. Campbell Stall.  Ophthalmology, Dr. Jethro Bolus.   CURRENT MEDICATIONS:  Synthroid 88 mcg daily, Zocor 20 mg daily, Vagifem  applied daily, Fosamax 70 mg daily, calcium, multi-vitamin, Lunesta 2 mg  q.h.s.   CHART REVIEW AND HEALTH MAINTENANCE:  The patient's last mammogram in  October 2006 with mammograms for this year upcoming.  Her last GYN and Pap  in October 2006 with appointment pending.  Past colonoscopy from January 24, 2006, with the patient having had hyperplastic polyp.  Her last stress  echocardiogram in 2001 was unremarkable.  Last chest x-ray from August 09, 2004, revealed no acute abnormality.   REVIEW OF SYSTEMS:  No fevers, sweats, chills, weight changes, or other  constitutional symptoms.  The patient has seen Dr. Nile Riggs in the last 12  months.  The patient did have an enamel crown to replace an old crown, but  no other major dental work reported.  No cardiovascular or respiratory  problems.  The patient does get good results with Zantac for occasional  heartburn.  She does have good results with Dulcolax for occasional problems  with bowel irregularity.  No GU, musculoskeletal, or dermatological  problems, otherwise, noted.   INTERVAL SOCIAL HISTORY:  No significant change.  She has two grandchildren,  age 44 and 1 in [place].  Her son is in Social worker school at [employer] in [place].  She is planning a birthday trip to [place] and a fun weekend, for  sure.   PHYSICAL EXAMINATION:  VITAL SIGNS:  Temperature 98, blood pressure 117/69, pulse 63, weight 113.  GENERAL:  Well nourished, well groomed woman looking younger than her stated  chronologic age in no acute distress.  HEENT:  Normocephalic, atraumatic, EACs and TMs were unremarkable.  Oropharynx with native dentition in good repair.  No buccal or palatal  lesions were noted.  Posterior pharynx was clear.  Conjunctivae and sclerae  were clear.  PERRLA.  EOMI. Funduscopic exam deferred to Dr. Nile Riggs.  NECK:  Supple without thyromegaly.  NODES:  No adenopathy was noted in the cervical or supraclavicular region.  CHEST:  No CVA tenderness, lungs were clear to auscultation and percussion.  BREASTS:  Exam deferred to Dr. Eda Paschal.  CARDIOVASCULAR:  2+ radial pulse, no JVD, no carotid bruits.  She had a  quiet precordium with a regular rate and rhythm without murmurs, gallops,  and rubs.  ABDOMEN:  Soft, no guarding, no rebound, no organosplenomegaly was noted.  PELVIC AND RECTAL:  Exam deferred to GYN and GI.  EXTREMITIES:  Without cyanosis, clubbing, edema, or deformity. She has good  peripheral pulses.  SKIN:  Clear  with no obvious rashes.  NEUROLOGICAL:  Nonfocal.   DATA BASE:  12 lead electrocardiogram revealed a normal sinus rhythm and a  normal EKG.   LABORATORY DATA:  Chemistries were unremarkable.  Glucose was 101.  SGOT was  2 points above normal at 39, SGPT was 13 points above normal at 53.  Cholesterol 162, triglyceride 77, HDL 57, LDL 90.  GFR was 49 milliliters  per minute.  TSH normal at 2.98.   ASSESSMENT:  1. Hypothyroid disease.  The patient is well controlled on her present      dose of Synthroid at 88 mcg daily and will continue the same.  2. Hyperlipidemia.  Patient with excellent control and at goal in regards      to her LDL.  She tolerates this well.  I suspect that a minor increase      in her transaminases is related to medication.  The patient's chart was      reviewed for the last several years and she has had intermittently      minimally elevated transaminases.  No further evaluation is needed, no      change in medication is indicated.  3. GI.  The patient's IBS has been stable and doing well.  She will      continue on Dulcolax as needed.  4. GYN.  The patient is current and up to date.  She continues to be off      of hormone replacement but is using Vagifem as noted.  5. Orthopedics.  The patient is stable at this time with no significant      discomfort.  She does remain very active and exercises on a regular      basis.  6. Health maintenance.  The patient is current and up to date with      colorectal cancer screening.  She does maintain regular scheduled      mammograms.  Copies are not available to me.  The patient does see Dr.      Eda Paschal on a regular basis.  The patient has a normal      electrocardiogram at today's exam.   SUMMARY:  Very healthy patient who is with the medical problems outlined  above that are stable and well controlled.  The patient was given Zostavax at today's visit for Varicella zoster protection.  She will return at her   convenience in 7-10 days for influenza vaccine.            ______________________________  Rosalyn Gess Norins, MD  MEN/MedQ  DD:  09/15/2006  DT:  09/16/2006  Job #:  [id number]

## 2011-07-02 ENCOUNTER — Other Ambulatory Visit: Payer: Self-pay | Admitting: Internal Medicine

## 2011-07-06 MED ORDER — LEVOTHYROXINE SODIUM 88 MCG PO TABS: 88.0000 ug | ORAL_TABLET | Freq: Every day | ORAL | Status: DC

## 2011-07-06 MED ORDER — SIMVASTATIN 20 MG PO TABS: 20.0000 mg | ORAL_TABLET | Freq: Every day | ORAL | Status: DC

## 2011-07-06 NOTE — Telephone Encounter (Signed)
rx resent  

## 2011-08-10 ENCOUNTER — Other Ambulatory Visit: Payer: PRIVATE HEALTH INSURANCE

## 2011-08-10 ENCOUNTER — Ambulatory Visit: Payer: PRIVATE HEALTH INSURANCE

## 2011-08-10 DIAGNOSIS — Z0000 Encounter for general adult medical examination without abnormal findings: Secondary | ICD-10-CM

## 2011-08-10 LAB — HEPATIC FUNCTION PANEL: Total Bilirubin: 0.5 mg/dL (ref 0.3–1.2)

## 2011-08-10 LAB — URINALYSIS
Hgb urine dipstick: NEGATIVE
Leukocytes, UA: NEGATIVE
Nitrite: NEGATIVE
Urobilinogen, UA: 0.2 (ref 0.0–1.0)

## 2011-08-10 LAB — CBC WITH DIFFERENTIAL/PLATELET
Basophils Absolute: 0 10*3/uL (ref 0.0–0.1)
Eosinophils Relative: 2.6 % (ref 0.0–5.0)
MCV: 89.7 fl (ref 78.0–100.0)
Monocytes Absolute: 0.5 10*3/uL (ref 0.1–1.0)
Monocytes Relative: 7 % (ref 3.0–12.0)
Neutrophils Relative %: 63.6 % (ref 43.0–77.0)
Platelets: 276 10*3/uL (ref 150.0–400.0)
WBC: 6.4 10*3/uL (ref 4.5–10.5)

## 2011-08-10 LAB — BASIC METABOLIC PANEL
BUN: 21 mg/dL (ref 6–23)
Creatinine, Ser: 1 mg/dL (ref 0.4–1.2)
GFR: 62.03 mL/min (ref >60.00)

## 2011-08-10 LAB — LIPID PANEL
Cholesterol: 176 mg/dL (ref 0–200)
LDL Cholesterol: 99 mg/dL (ref 0–99)
Triglycerides: 70 mg/dL (ref 0.0–149.0)
VLDL: 14 mg/dL (ref 0.0–40.0)

## 2011-08-11 ENCOUNTER — Encounter: Payer: Self-pay | Admitting: Internal Medicine

## 2011-08-15 ENCOUNTER — Ambulatory Visit (INDEPENDENT_AMBULATORY_CARE_PROVIDER_SITE_OTHER): Payer: PRIVATE HEALTH INSURANCE | Admitting: Internal Medicine

## 2011-08-15 VITALS — BP 108/60 | HR 59 | Temp 97.8°F | Wt 112.0 lb

## 2011-08-15 DIAGNOSIS — Z23 Encounter for immunization: Secondary | ICD-10-CM

## 2011-08-15 DIAGNOSIS — K219 Gastro-esophageal reflux disease without esophagitis: Secondary | ICD-10-CM

## 2011-08-15 DIAGNOSIS — Z0000 Encounter for general adult medical examination without abnormal findings: Secondary | ICD-10-CM

## 2011-08-15 DIAGNOSIS — E039 Hypothyroidism, unspecified: Secondary | ICD-10-CM

## 2011-08-15 DIAGNOSIS — Z136 Encounter for screening for cardiovascular disorders: Secondary | ICD-10-CM

## 2011-08-15 DIAGNOSIS — E782 Mixed hyperlipidemia: Secondary | ICD-10-CM

## 2011-08-15 DIAGNOSIS — R079 Chest pain, unspecified: Secondary | ICD-10-CM

## 2011-08-15 MED ORDER — PNEUMOCOCCAL VAC POLYVALENT 25 MCG/0.5ML IJ INJ: 0.5000 mL | INJECTION | Freq: Once | INTRAMUSCULAR | Status: DC

## 2011-08-15 NOTE — Progress Notes (Signed)
Subjective:    Patient ID: Dawn Tucker, female    DOB: 01-17-1946, 65 y.o.   MRN: [id number]  HPI Mrs. Steier presents for general routine medical exam. In the interval since her last visit she has had redo EGD and dilatation of the esophagus Feb '12. Earlier in the year she had had an EGD with dilatation as well. She is now on dexilant instead of Nexium. Explained the mechanism of action of proton pump inhibitors (PPIs) and the delivery mechanism of Dexilant. Reviewed with her Dr. Lamar Sprinkles last note as well as the images from her EGD.    Occasionally she will experience sharp left chest pain for which she will take an extra 81 mg aspirin, water and zantac. This will usually give her relief. There is still some concern in her mind that this could be cardiac in origin. She did have a nuclear scan spring '11 - negative study with good exercise effort with EF 70% and no evidence of obstructive disease. Reassured her that this is a reliable and sensitive study which makes symptomatic coronary artery disease highly unlikely. In addition, her symptoms are atypical: sharp pain is more likely GI than the crushing pressure discomfort of angina.  We discussed bone health and the appropriate intake of calcium at 1200 mg daily, diet + supplement; the desired amount of vitamin D - [phone] iu daily  She is current with gyn care and breast cancer screening.  Past Medical History  Diagnosis Date  . CHEST PAIN 03/23/2010  . DEGENERATIVE DISC DISEASE 08/07/2008  . DYSPHAGIA UNSPECIFIED 03/23/2010  . ESOPHAGEAL STRICTURE 01/10/2011  . FIBROIDS, UTERUS 08/07/2008  . GERD 03/23/2010  . HIATAL HERNIA 01/10/2011  . HYPOTHYROIDISM 08/07/2008  . IRRITABLE BOWEL SYNDROME, HX OF 08/07/2008  . Mixed hyperlipidemia 08/07/2008  . OSTEOPENIA 08/06/2008  . PERSONAL HX COLONIC POLYPS 03/23/2010  . Scarlet fever 08/07/2008  . Unspecified pruritic disorder 08/07/2008  . VAGINITIS, ATROPHIC 08/07/2008   Past Surgical History    Procedure Date  . Rhinoplasty   . Breast surgery     Breast biopsies right & left- benign lesions  . Cesarean section    Family History  Problem Relation Age of Onset  . Pneumonia Mother   . Coronary artery disease Father   . Heart disease Father     CAD/MI-fatal  . Breast cancer Paternal Aunt   . Cancer Paternal Aunt     breast cancer  . Prostate cancer Maternal Grandfather   . Cancer Maternal Grandfather     prostate cancer  . Diabetes Neg Hx   . Colon cancer Neg Hx    History   Social History  . Marital Status: Married    Spouse Name: N/A    Number of Children: N/A  . Years of Education: N/A   Occupational History  . Not on file.   Social History Main Topics  . Smoking status: Never Smoker   . Smokeless tobacco: Never Used  . Alcohol Use: Yes     occassional  . Drug Use: No  . Sexually Active: Yes -- Female partner(s)   Other Topics Concern  . Not on file   Social History Narrative   HSG, [employer]. Married 68'. 1 son- '79, 1 daughter- '71; 2 grandchildren. Marriage is in good health.Very active in community affairs       Review of Systems Review of Systems  Constitutional:  Negative for fever, chills, activity change and unexpected weight change.  HEENT: Positive for mild hearing  loss, never tested. Negative for ear pain, congestion, neck stiffness and postnasal drip. Negative for sore throat or swallowing problems. Negative for dental complaints.   Eyes: Negative for vision loss or change in visual acuity.  Respiratory: Negative for chest tightness and wheezing.   Cardiovascular: Negative for chest pain and palpitation. No decreased exercise tolerance Gastrointestinal: No change in bowel habit. No bloating or gas. Occasional reflux or indigestion Genitourinary: Negative for urgency, frequency, flank pain and difficulty urinating.  Musculoskeletal: Negative for myalgias, back pain, arthralgias and gait problem.  Neurological: Negative for  dizziness, tremors, weakness and headaches.  Hematological: Negative for adenopathy.  Psychiatric/Behavioral: Negative for behavioral problems and dysphoric mood.       Objective:   Physical Exam Vitals reviewed - normal Gen'l: well nourished, well developed white woman in no distress HEENT - [place]/AT, EACs/TMs normal, oropharynx with native dentition in good condition, no buccal or palatal lesions, posterior pharynx clear, mucous membranes moist. C&S clear, PERRLA, fundi - normal Neck - supple, no thyromegaly Nodes- negative submental, cervical, supraclavicular regions Chest - no deformity, no CVAT Lungs - cleat without rales, wheezes. No increased work of breathing Breast - deferred to gyn  Cardiovascular - regular rate and rhythm, quiet precordium, no murmurs, rubs or gallops, 2+ radial, DP and PT pulses Abdomen - BS+ x 4, no HSM, no guarding or rebound or tenderness Pelvic - deferred to gyn Rectal - deferred to gyn Extremities - no clubbing, cyanosis, edema or deformity.  Neuro - A&O x 3, CN II-XII normal, motor strength normal and equal, DTRs 2+ and symmetrical biceps, radial, and patellar tendons. Cerebellar - no tremor, no rigidity, fluid movement and normal gait. Derm - Head, neck, back, abdomen and extremities without suspicious lesions  Lab Results  Component Value Date   WBC 6.4 08/10/2011   HGB 12.4 08/10/2011   HCT 37.3 08/10/2011   PLT 276.0 08/10/2011   CHOL 176 08/10/2011   TRIG 70.0 08/10/2011   HDL 62.90 08/10/2011   ALT 23 08/10/2011   AST 27 08/10/2011   NA 141 08/10/2011   K 5.3* 08/10/2011   CL 103 08/10/2011   CREATININE 1.0 08/10/2011   BUN 21 08/10/2011   CO2 31 08/10/2011   TSH 0.63 08/10/2011   Lab Results  Component Value Date   LDLCALC 99 08/10/2011       Glucose                 106                                                                  08/10/2011    Stress Test Technologist:  [employer] CMA-N     Nuclear Technologist:  Domenic Polite  CNMT  Rest Procedure   Myocardial perfusion imaging was performed at rest 45 minutes following the intravenous administration of Myoview Technetium 14m Tetrofosmin.  Stress Procedure   The patient exercised for 9:31.  The patient stopped due to fatigue and denied any chest pain.  She did have a vagal response at peak exercise with the myoveiw injection..  There were ST-T wave changes with exercise.  Myoview was injected at peak exercise and myocardial perfusion imaging was performed after a brief delay.  QPS  Raw Data Images:  Normal; no motion artifact; normal heart/lung ratio. Stress Images:  NI: Uniform and normal uptake of tracer in all myocardial segments. Rest Images:  Normal homogeneous uptake in all areas of the myocardium. Subtraction (SDS):  There is no evidence of scar or ischemia. Transient Ischemic Dilatation:  .99  (Normal <1.22)  Lung/Heart Ratio:  .30  (Normal <0.45)  Quantitative Gated Spect Images  QGS EDV:  56 ml QGS ESV:  15 ml QGS EF:  74 % QGS cine images:  Normal wall motion.    Overall Impression   Exercise Capacity: Good exercise capacity. BP Response: Hypotensive blood pressure response. ? vagal response to myoview injection.  Clinical Symptoms: Fatigue.  ECG Impression: Insignificant upsloping ST segment depression. Overall Impression: Normal stress nuclear study.    Signed by Marca Ancona, MD on 03/31/2010 at 1:24 AM         Assessment & Plan:

## 2011-08-16 ENCOUNTER — Encounter: Payer: Self-pay | Admitting: Internal Medicine

## 2011-08-16 ENCOUNTER — Telehealth: Payer: Self-pay | Admitting: *Deleted

## 2011-08-16 DIAGNOSIS — Z0000 Encounter for general adult medical examination without abnormal findings: Secondary | ICD-10-CM | POA: Insufficient documentation

## 2011-08-16 NOTE — Assessment & Plan Note (Signed)
Excellent control on Zocor with LDL less than 100.  Plan - continue present medication and life-style management

## 2011-08-16 NOTE — Assessment & Plan Note (Signed)
Reviewed Myoview and GI notes. Her symptoms are atypical for angina and there is no evidence of obstructive disease on study.  Plan - no further evaluation at this time. If symptoms become worse, especially with any exertional chest pain will refer to cardiology.

## 2011-08-16 NOTE — Assessment & Plan Note (Signed)
Reviewed GI notes and EGD reports. No esophageal abnormality or Barrett's. 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   Plan - continue Dexilant.

## 2011-08-16 NOTE — Assessment & Plan Note (Signed)
Lab Results  Component Value Date   TSH 0.63 08/10/2011   Normal range TSH. Plan is to continue present medication.

## 2011-08-16 NOTE — Assessment & Plan Note (Addendum)
Interval history as noted with recurrent stricture and occasional sharp chest pain. Physical exam, sans pelvic and breast, is normal. Lab results are in normal range. She is current with colorectal cancer screening with last exam Feb '08. Last mammogram Feb '12. She is current with gyn exams. Immunizations: Tetanus Oct '10; Shingles vaccine Sept '07; pneumonia vaccine - administered today. Discussed bone health and she is current with bone density study. 12 lead EKG with no evidence of ischemia or injury.   In summary - a delightful woman who is medically stable. She will continue with her healthy life-style and her present medical regimen. She will return as needed or in 1 year.

## 2011-08-16 NOTE — Telephone Encounter (Signed)
Spoke w/patient. She had pneumonia vaccine at [facility] recently. Pt c/o redness, slight swelling, itching and discomfort. Advised cool compresses for swelling and itching, also otc anti itch cream for minor symptoms, warm compresses for muscle soreness. She has no fever, drainage or other signs of infection and says area has improved over the day. Pt will call office for any severe symptoms or signs of infection.

## 2011-08-17 NOTE — Telephone Encounter (Signed)
Good advice. thnaks

## 2011-10-13 ENCOUNTER — Telehealth: Payer: Self-pay | Admitting: *Deleted

## 2011-10-13 MED ORDER — ESZOPICLONE 2 MG PO TABS: 2.0000 mg | ORAL_TABLET | Freq: Every day | ORAL | Status: DC

## 2011-10-13 NOTE — Telephone Encounter (Signed)
Refill request for Lunesta 2 mg SIG one tablet at bedtime Qty 30 last dispensed 02/17/2011. Please Advise refill

## 2011-10-13 NOTE — Telephone Encounter (Signed)
OK Lunesta x 5

## 2011-11-03 ENCOUNTER — Other Ambulatory Visit: Payer: Self-pay | Admitting: *Deleted

## 2011-11-03 MED ORDER — LEVOTHYROXINE SODIUM 88 MCG PO TABS: 88.0000 ug | ORAL_TABLET | Freq: Every day | ORAL | Status: DC

## 2011-12-16 ENCOUNTER — Encounter: Payer: Self-pay | Admitting: *Deleted

## 2011-12-22 ENCOUNTER — Ambulatory Visit (INDEPENDENT_AMBULATORY_CARE_PROVIDER_SITE_OTHER): Payer: Medicare Other | Admitting: Obstetrics and Gynecology

## 2011-12-22 ENCOUNTER — Encounter: Payer: Self-pay | Admitting: Obstetrics and Gynecology

## 2011-12-22 ENCOUNTER — Other Ambulatory Visit (HOSPITAL_COMMUNITY)
Admission: RE | Admit: 2011-12-22 | Discharge: 2011-12-22 | Disposition: A | Discharge status: Home / Self Care | Payer: Medicare Other | Source: Ambulatory Visit | Attending: Obstetrics and Gynecology | Admitting: Obstetrics and Gynecology

## 2011-12-22 VITALS — BP 122/76 | Ht 63.0 in | Wt 113.0 lb

## 2011-12-22 DIAGNOSIS — N870 Mild cervical dysplasia: Secondary | ICD-10-CM | POA: Insufficient documentation

## 2011-12-22 DIAGNOSIS — M8580 Other specified disorders of bone density and structure, unspecified site: Secondary | ICD-10-CM

## 2011-12-22 DIAGNOSIS — N952 Postmenopausal atrophic vaginitis: Secondary | ICD-10-CM

## 2011-12-22 DIAGNOSIS — M899 Disorder of bone, unspecified: Secondary | ICD-10-CM | POA: Diagnosis not present

## 2011-12-22 DIAGNOSIS — Z124 Encounter for screening for malignant neoplasm of cervix: Secondary | ICD-10-CM | POA: Insufficient documentation

## 2011-12-22 DIAGNOSIS — N6019 Diffuse cystic mastopathy of unspecified breast: Secondary | ICD-10-CM

## 2011-12-22 DIAGNOSIS — M949 Disorder of cartilage, unspecified: Secondary | ICD-10-CM | POA: Diagnosis not present

## 2011-12-22 MED ORDER — ESTRADIOL 10 MCG VA TABS: 10.0000 ug | ORAL_TABLET | VAGINAL | Status: DC

## 2011-12-22 NOTE — Progress Notes (Signed)
Patient came back to see me today for further followup. The first thing we discussed with her osteopenia. She takes her calcium and vitamin D. She's had no fractures. Her last bone density showed improvement from the previous one. It was a short term followup. She previously been on Fosamax and is on drug holiday. She is having no vaginal bleeding. She is having no pelvic pain. She uses Vagifem with good results for atrophic vaginitis. She's having minimal menopausal symptoms. She has a history of fibrocystic breast disease. Her last mammogram showed scattered fibroglandular densities. She's had 2 previous breast biopsies. The first was for a lump in her 28s. The second was for calcifications. They were both benign. She has a sister who was recently diagnosed with breast cancer and had a mastectomy. She has a paternal aunt who had breast cancer before the age of 10 and then developed ovarian cancer late in life. She was not skin  tested but her sister was and was BRCA1 and BRCA2 negative. There is no other family history.  ROS: 12 system review done. Patient has esophageal stricture and is requiring a second dilatation. She also has hypothyroidism and hyperlipidemia. She also has degenerative disc disease. Only other pertinent positives are listed above.  Physical examination:  [name] present. HEENT within normal limits. Neck: Thyroid not large. No masses. Supraclavicular nodes: not enlarged. Breasts: Examined in both sitting midline position. No skin changes and no masses. Abdomen: Soft no guarding rebound or masses or hernia. Pelvic: External: Within normal limits. BUS: Within normal limits. Vaginal:within normal limits. Good estrogen effect. No evidence of cystocele rectocele or enterocele. Cervix: clean. Uterus: Normal size and shape. Adnexa: No masses. Rectovaginal exam: Confirmatory and negative. Extremities: Within normal limits.  Assessment: #1. Atrophic vaginitis #2. Osteopenia #3.  Fibrocystic breast disease with positive family history for breast and ovarian cancer  Plan: Patient to get a mammogram and then will decide about MRI. Genetic counseling at the breast Center. Continue periodic bone densities.

## 2011-12-22 NOTE — Patient Instructions (Signed)
Call cancer center and schedule genetic counseling. I will call you after mammogram.

## 2012-01-05 ENCOUNTER — Telehealth: Payer: Self-pay | Admitting: *Deleted

## 2012-01-05 MED ORDER — SIMVASTATIN 20 MG PO TABS: 20.0000 mg | ORAL_TABLET | Freq: Every day | ORAL | Status: DC

## 2012-01-05 NOTE — Telephone Encounter (Signed)
Refill request for Zocor 20mg .

## 2012-01-24 ENCOUNTER — Other Ambulatory Visit: Payer: Self-pay | Admitting: Obstetrics and Gynecology

## 2012-01-24 DIAGNOSIS — Z1231 Encounter for screening mammogram for malignant neoplasm of breast: Secondary | ICD-10-CM

## 2012-02-01 ENCOUNTER — Ambulatory Visit (INDEPENDENT_AMBULATORY_CARE_PROVIDER_SITE_OTHER): Payer: Medicare Other | Admitting: Obstetrics and Gynecology

## 2012-02-01 ENCOUNTER — Encounter: Payer: Self-pay | Admitting: Obstetrics and Gynecology

## 2012-02-01 DIAGNOSIS — N898 Other specified noninflammatory disorders of vagina: Secondary | ICD-10-CM

## 2012-02-01 DIAGNOSIS — N949 Unspecified condition associated with female genital organs and menstrual cycle: Secondary | ICD-10-CM | POA: Diagnosis not present

## 2012-02-01 DIAGNOSIS — B009 Herpesviral infection, unspecified: Secondary | ICD-10-CM | POA: Insufficient documentation

## 2012-02-01 DIAGNOSIS — S30820A Blister (nonthermal) of lower back and pelvis, initial encounter: Secondary | ICD-10-CM

## 2012-02-01 DIAGNOSIS — S2092XA Blister (nonthermal) of unspecified parts of thorax, initial encounter: Secondary | ICD-10-CM | POA: Diagnosis not present

## 2012-02-01 LAB — WET PREP FOR TRICH, YEAST, CLUE
Trich, Wet Prep: NONE SEEN
Yeast Wet Prep HPF POC: NONE SEEN

## 2012-02-01 MED ORDER — METRONIDAZOLE 0.75 % VA GEL: 1.0000 | Freq: Two times a day (BID) | VAGINAL | Status: AC

## 2012-02-01 NOTE — Progress Notes (Signed)
The patient came to see me today with a two-day history of a vaginal odor. It is not associated with either discharge or itching. She also is aware of to bumps on her very low back. 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  Exam: Patient has 2 areas on her lower back on neither side of the spine that are slightly raised and irregular but without a true vesicle. They are consistent with herpes. External genitalia: Within normal limits. BUS: Within normal limits. Vaginal exam shows a frothy discharge. Cervix is clean.  Assessment: #1. 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 Culture obtained from back lesion. Patient to take Valtrex 1 g twice a day for one day as per Dr. Nino Parsley original recommendations. Discussed risk to husband. Told her I thought was small but they should use condoms until lesion gone.

## 2012-02-03 ENCOUNTER — Telehealth: Payer: Self-pay

## 2012-02-03 ENCOUNTER — Encounter: Payer: Self-pay | Admitting: Obstetrics and Gynecology

## 2012-02-03 MED ORDER — SIMVASTATIN 20 MG PO TABS: 20.0000 mg | ORAL_TABLET | Freq: Every day | ORAL | Status: DC

## 2012-02-03 NOTE — Telephone Encounter (Signed)
Rx received for refill

## 2012-02-06 NOTE — Progress Notes (Signed)
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 I called the patient today and she said it is almost dried up. She has been using Valtrex 500 mg twice a day since I saw her. She will now stop it. We discussed chronic suppression but we'll not do it unless her reoccurrences come more often.

## 2012-02-22 ENCOUNTER — Ambulatory Visit
Admission: RE | Admit: 2012-02-22 | Discharge: 2012-02-22 | Disposition: A | Discharge status: Home / Self Care | Payer: Medicare Other | Source: Ambulatory Visit | Attending: Obstetrics and Gynecology | Admitting: Obstetrics and Gynecology

## 2012-02-22 DIAGNOSIS — Z1231 Encounter for screening mammogram for malignant neoplasm of breast: Secondary | ICD-10-CM | POA: Diagnosis not present

## 2012-02-23 ENCOUNTER — Other Ambulatory Visit: Payer: Self-pay | Admitting: Internal Medicine

## 2012-04-10 ENCOUNTER — Ambulatory Visit (INDEPENDENT_AMBULATORY_CARE_PROVIDER_SITE_OTHER): Payer: Medicare Other | Admitting: Obstetrics and Gynecology

## 2012-04-10 DIAGNOSIS — B373 Candidiasis of vulva and vagina: Secondary | ICD-10-CM | POA: Diagnosis not present

## 2012-04-10 DIAGNOSIS — N898 Other specified noninflammatory disorders of vagina: Secondary | ICD-10-CM

## 2012-04-10 DIAGNOSIS — N949 Unspecified condition associated with female genital organs and menstrual cycle: Secondary | ICD-10-CM

## 2012-04-10 DIAGNOSIS — A499 Bacterial infection, unspecified: Secondary | ICD-10-CM

## 2012-04-10 DIAGNOSIS — N760 Acute vaginitis: Secondary | ICD-10-CM | POA: Diagnosis not present

## 2012-04-10 LAB — WET PREP FOR TRICH, YEAST, CLUE
Clue Cells Wet Prep HPF POC: NONE SEEN
Trich, Wet Prep: NONE SEEN

## 2012-04-10 MED ORDER — CLINDAMYCIN PHOSPHATE 2 % VA CREA: 1.0000 | TOPICAL_CREAM | Freq: Every day | VAGINAL | Status: AC

## 2012-04-10 MED ORDER — FLUCONAZOLE 150 MG PO TABS: 150.0000 mg | ORAL_TABLET | Freq: Every day | ORAL | Status: AC

## 2012-04-10 NOTE — Progress Notes (Signed)
Patient came back to see me today because of a vaginal odor. We have treated her with MetroGel nightly for 5 nights in February for the same problem with complete resolution of symptoms. She was fine for 2 months and then started to notice a vaginal odor again. She is having no vaginal discharge or itching. She has reused the MetroGel vaginal cream without help.  Exam: Dawn Tucker present.  External: Within normal limits. BUS: Within normal limits. Vaginal exam: Yeast like discharge. Moderate estrogen effect. Wet prep positive for bacteria and white blood cells.  Assessment: Probable mixed infection  Plan: Cleocin vaginal cream for 7 nights. Diflucan 150 mg daily for 3 nights. Start oral rephresh 3 times a week.

## 2012-05-01 ENCOUNTER — Other Ambulatory Visit: Payer: Self-pay

## 2012-05-01 ENCOUNTER — Telehealth: Payer: Self-pay | Admitting: *Deleted

## 2012-05-01 MED ORDER — SIMVASTATIN 20 MG PO TABS: 20.0000 mg | ORAL_TABLET | Freq: Every day | ORAL | Status: DC

## 2012-05-01 NOTE — Telephone Encounter (Signed)
Ok for renewal

## 2012-05-01 NOTE — Telephone Encounter (Signed)
Pt wanting refill on her lunesta 2 mg. Last filled 10/13/11. Is this ok?Marland Kitchen... 05/01/12@4 :17pm/LMB

## 2012-05-02 MED ORDER — ESZOPICLONE 2 MG PO TABS: 2.0000 mg | ORAL_TABLET | Freq: Every day | ORAL | Status: DC

## 2012-05-02 NOTE — Telephone Encounter (Signed)
Called renewal into pharmacy. Had to leave msg on md vm ok # 30 with 5 additional refills on lunesta. Updated EPIC... 05/03/12@8 :45am/LMB

## 2012-06-06 ENCOUNTER — Other Ambulatory Visit: Payer: Self-pay | Admitting: *Deleted

## 2012-06-06 MED ORDER — LEVOTHYROXINE SODIUM 88 MCG PO TABS: 88.0000 ug | ORAL_TABLET | Freq: Every day | ORAL | Status: DC

## 2012-06-06 NOTE — Telephone Encounter (Signed)
REFILL Rx sent to [facility]. synthroid

## 2012-06-25 ENCOUNTER — Telehealth: Payer: Self-pay | Admitting: Internal Medicine

## 2012-06-25 DIAGNOSIS — IMO0002 Reserved for concepts with insufficient information to code with codable children: Secondary | ICD-10-CM

## 2012-06-25 DIAGNOSIS — E039 Hypothyroidism, unspecified: Secondary | ICD-10-CM

## 2012-06-25 DIAGNOSIS — E782 Mixed hyperlipidemia: Secondary | ICD-10-CM

## 2012-06-25 NOTE — Telephone Encounter (Signed)
Orders entered

## 2012-06-25 NOTE — Telephone Encounter (Signed)
The pt called and is hoping to get her cpe labs done before the apt.  Can this be done?   Thanks!

## 2012-07-02 DIAGNOSIS — D239 Other benign neoplasm of skin, unspecified: Secondary | ICD-10-CM | POA: Diagnosis not present

## 2012-07-02 DIAGNOSIS — D485 Neoplasm of uncertain behavior of skin: Secondary | ICD-10-CM | POA: Diagnosis not present

## 2012-07-02 DIAGNOSIS — L821 Other seborrheic keratosis: Secondary | ICD-10-CM | POA: Diagnosis not present

## 2012-07-02 DIAGNOSIS — Z808 Family history of malignant neoplasm of other organs or systems: Secondary | ICD-10-CM | POA: Diagnosis not present

## 2012-07-02 DIAGNOSIS — B009 Herpesviral infection, unspecified: Secondary | ICD-10-CM | POA: Diagnosis not present

## 2012-07-06 ENCOUNTER — Other Ambulatory Visit: Payer: Self-pay | Admitting: Internal Medicine

## 2012-07-10 ENCOUNTER — Ambulatory Visit (INDEPENDENT_AMBULATORY_CARE_PROVIDER_SITE_OTHER): Payer: Medicare Other | Admitting: Internal Medicine

## 2012-07-10 ENCOUNTER — Ambulatory Visit: Payer: Medicare Other | Admitting: Internal Medicine

## 2012-07-10 ENCOUNTER — Encounter: Payer: Self-pay | Admitting: Internal Medicine

## 2012-07-10 VITALS — BP 126/70 | HR 68 | Ht 63.0 in | Wt 111.0 lb

## 2012-07-10 DIAGNOSIS — R0789 Other chest pain: Secondary | ICD-10-CM

## 2012-07-10 DIAGNOSIS — K219 Gastro-esophageal reflux disease without esophagitis: Secondary | ICD-10-CM | POA: Diagnosis not present

## 2012-07-10 DIAGNOSIS — R1310 Dysphagia, unspecified: Secondary | ICD-10-CM | POA: Diagnosis not present

## 2012-07-10 NOTE — Progress Notes (Signed)
HISTORY OF PRESENT ILLNESS:  Dawn Tucker is a 66 y.o. female with dyslipidemia, hypothyroidism, constipation predominant irritable bowel syndrome, and GERD. She has also been evaluated for problems with dysphagia and atypical chest pain. She was last seen in March of 2012 and followup after having undergone upper endoscopy (unremarkable exam) with esophageal dilation (56 [place] Maloney dilator). She was on Dexilant as a new PPI. Overall she seemed improved, though not completely asymptomatic. She had been dilated one time prior to that in May of 2011 (large-caliber ring of the distal esophagus dilated to 36 [place]). At this time she complains of increasing problems with regurgitation. As well intermittent solid food dysphagia to item such as salmon and bread. A sensation of some trouble with liquids, though less problematic. In terms of chest pain, she thinks that this is less of a problem and occurs no more than twice per month. She reports that that discomfort is promptly relieved with when necessary Zantac. She has had previous, negative, cardiac workup. No other GI complaints.  REVIEW OF SYSTEMS:  All non-GI ROS entirely negative  Past Medical History  Diagnosis Date  . CHEST PAIN 03/23/2010  . DEGENERATIVE DISC DISEASE 08/07/2008  . DYSPHAGIA UNSPECIFIED 03/23/2010  . ESOPHAGEAL STRICTURE 01/10/2011  . FIBROIDS, UTERUS 08/07/2008  . GERD 03/23/2010  . HIATAL HERNIA 01/10/2011  . HYPOTHYROIDISM 08/07/2008  . IRRITABLE BOWEL SYNDROME, HX OF 08/07/2008  . Mixed hyperlipidemia 08/07/2008  . OSTEOPENIA 08/06/2008  . PERSONAL HX COLONIC POLYPS 03/23/2010  . Scarlet fever 08/07/2008  . Unspecified pruritic disorder 08/07/2008  . VAGINITIS, ATROPHIC 08/07/2008  . CIN I (cervical intraepithelial neoplasia I)   . Herpes     Past Surgical History  Procedure Date  . Rhinoplasty   . Breast surgery     Breast biopsies right & left- benign lesions  . Cesarean section   . Tubal ligation   . Gynecologic  cryosurgery   . Colposcopy     Social History Dawn Tucker  reports that she has never smoked. She has never used smokeless tobacco. She reports that she drinks alcohol. She reports that she does not use illicit drugs.  family history includes Breast cancer in her paternal aunt and sister; Cancer in her maternal grandfather and paternal aunt; Coronary artery disease in her father; Heart disease in her father; Pneumonia in her mother; and Prostate cancer in her maternal grandfather.  There is no history of Diabetes and Colon cancer.  Allergies  Allergen Reactions  . Sulfonamide Derivatives        PHYSICAL EXAMINATION: Vital signs: BP 126/70  Pulse 68  Ht 5\' 3"  (1.6 m)  Wt 111 lb (50.349 kg)  BMI 19.66 kg/m2 General: Well-developed, well-nourished, no acute distress HEENT: Sclerae are anicteric, conjunctiva pink. Oral mucosa intact Lungs: Clear Heart: Regular Abdomen: soft, nontender, nondistended, no obvious ascites, no peritoneal signs, normal bowel sounds. No organomegaly. Extremities: No edema Psychiatric: alert and oriented x3. Cooperative   ASSESSMENT:  #1. GERD. Felt to be responsible for previous problems with chest discomfort and dysphagia. Now with regurgitation but no pyrosis #2. Recurrent dysphagia. Again, appears to be more intermittent solid food. Possibly recurrent esophageal ring or stricture. Other concerns would be extrinsic compression such as dysphagia aortica or lusoria. Finally, motility disorder. #3. Colon cancer screening. Up-to-date. Due for followup 2015.   PLAN:  #1. Barium esophagogram with tablet. Further plans to be determined thereafter. #2. Reflux precautions #3. Continue PPI #4. Routine surveillance colonoscopy 2015

## 2012-07-10 NOTE — Patient Instructions (Addendum)
You have been scheduled for a Barium Esophogram at [facility] (1st floor of the hospital) on 07-13-12 at 11:00am. Please arrive 15 minutes prior to your appointment for registration. Make certain not to have anything to eat or drink 6 hours prior to your test. If you need to reschedule for any reason, please contact radiology at [phone] to do so.

## 2012-07-13 ENCOUNTER — Ambulatory Visit (HOSPITAL_COMMUNITY)
Admission: RE | Admit: 2012-07-13 | Discharge: 2012-07-13 | Disposition: A | Discharge status: Home / Self Care | Payer: Medicare Other | Source: Ambulatory Visit | Attending: Internal Medicine | Admitting: Internal Medicine

## 2012-07-13 DIAGNOSIS — R1310 Dysphagia, unspecified: Secondary | ICD-10-CM | POA: Diagnosis not present

## 2012-07-16 ENCOUNTER — Telehealth: Payer: Self-pay | Admitting: Internal Medicine

## 2012-07-16 NOTE — Telephone Encounter (Signed)
Discussed with pt that Dr. Marina Goodell is out of the office and will return tomorrow. Will call pt with results once Dr. Marina Goodell has reviewed them.

## 2012-07-17 ENCOUNTER — Ambulatory Visit (AMBULATORY_SURGERY_CENTER): Payer: Medicare Other

## 2012-07-17 VITALS — Ht 63.0 in | Wt 110.0 lb

## 2012-07-17 DIAGNOSIS — R1310 Dysphagia, unspecified: Secondary | ICD-10-CM

## 2012-07-17 NOTE — Progress Notes (Signed)
I spoke with the patient and her husband in detail. They understand the results of the esophagram and  are feeling better about things

## 2012-07-17 NOTE — Telephone Encounter (Signed)
See result note.  

## 2012-07-20 ENCOUNTER — Ambulatory Visit (AMBULATORY_SURGERY_CENTER): Payer: Medicare Other | Admitting: Internal Medicine

## 2012-07-20 ENCOUNTER — Encounter: Payer: Medicare Other | Admitting: Internal Medicine

## 2012-07-20 ENCOUNTER — Encounter: Payer: Self-pay | Admitting: Internal Medicine

## 2012-07-20 VITALS — BP 131/84 | HR 66 | Temp 96.8°F | Resp 18 | Ht 63.0 in | Wt 110.0 lb

## 2012-07-20 DIAGNOSIS — R1310 Dysphagia, unspecified: Secondary | ICD-10-CM

## 2012-07-20 DIAGNOSIS — K222 Esophageal obstruction: Secondary | ICD-10-CM | POA: Diagnosis not present

## 2012-07-20 DIAGNOSIS — K219 Gastro-esophageal reflux disease without esophagitis: Secondary | ICD-10-CM

## 2012-07-20 MED ORDER — SODIUM CHLORIDE 0.9 % IV SOLN: 500.0000 mL | INTRAVENOUS | Status: DC

## 2012-07-20 NOTE — Patient Instructions (Addendum)
Findings:  Stricture - Dilation Recommendations:  Clear liquids until 4 pm then soft diet for rest of day.  Continue dexilant, call office and make appointment for 4-6 weeks to see Dr Marina Goodell.    YOU HAD AN ENDOSCOPIC PROCEDURE TODAY AT THE [facility]: Refer to the procedure report that was given to you for any specific questions about what was found during the examination.  If the procedure report does not answer your questions, please call your gastroenterologist to clarify.  If you requested that your care partner not be given the details of your procedure findings, then the procedure report has been included in a sealed envelope for you to review at your convenience later.  YOU SHOULD EXPECT: Some feelings of bloating in the abdomen. Passage of more gas than usual.  Walking can help get rid of the air that was put into your GI tract during the procedure and reduce the bloating. If you had a lower endoscopy (such as a colonoscopy or flexible sigmoidoscopy) you may notice spotting of blood in your stool or on the toilet paper. If you underwent a bowel prep for your procedure, then you may not have a normal bowel movement for a few days.  DIET: Your first meal following the procedure should be a light meal and then it is ok to progress to your normal diet.  A half-sandwich or bowl of soup is an example of a good first meal.  Heavy or fried foods are harder to digest and may make you feel nauseous or bloated.  Likewise meals heavy in dairy and vegetables can cause extra gas to form and this can also increase the bloating.  Drink plenty of fluids but you should avoid alcoholic beverages for 24 hours.  ACTIVITY: Your care partner should take you home directly after the procedure.  You should plan to take it easy, moving slowly for the rest of the day.  You can resume normal activity the day after the procedure however you should NOT DRIVE or use heavy machinery for 24 hours (because of the  sedation medicines used during the test).    SYMPTOMS TO REPORT IMMEDIATELY: A gastroenterologist can be reached at any hour.  During normal business hours, 8:30 AM to 5:00 PM Monday through Friday, call [phone].  After hours and on weekends, please call the GI answering service at [phone] who will take a message and have the physician on call contact you.   Following lower endoscopy (colonoscopy or flexible sigmoidoscopy):  Excessive amounts of blood in the stool  Significant tenderness or worsening of abdominal pains  Swelling of the abdomen that is new, acute  Fever of 100F or higher  Following upper endoscopy (EGD)  Vomiting of blood or coffee ground material  New chest pain or pain under the shoulder blades  Painful or persistently difficult swallowing  New shortness of breath  Fever of 100F or higher  Black, tarry-looking stools  FOLLOW UP: If any biopsies were taken you will be contacted by phone or by letter within the next 1-3 weeks.  Call your gastroenterologist if you have not heard about the biopsies in 3 weeks.  Our staff will call the home number listed on your records the next business day following your procedure to check on you and address any questions or concerns that you may have at that time regarding the information given to you following your procedure. This is a courtesy call and so if there is no answer  at the home number and we have not heard from you through the emergency physician on call, we will assume that you have returned to your regular daily activities without incident.  SIGNATURES/CONFIDENTIALITY: You and/or your care partner have signed paperwork which will be entered into your electronic medical record.  These signatures attest to the fact that that the information above on your After Visit Summary has been reviewed and is understood.  Full responsibility of the confidentiality of this discharge information lies with you and/or your  care-partner.   Please follow all discharge instructions given to you by the recovery room nurse. If you have any questions or problems after discharge please call one of the numbers listed above. You will receive a phone call in the am to see how you are doing and answer any questions you may have. Thank you for choosing [facility] for your health care needs.

## 2012-07-20 NOTE — Op Note (Signed)
[facility] [address] [place], [place]  [address]  ENDOSCOPY PROCEDURE REPORT  PATIENT:  Dawn Tucker, Dawn Tucker  MR#:  [id number] BIRTHDATE:  1946/07/05, 65 yrs. old  GENDER:  female  ENDOSCOPIST:  Wilhemina Bonito. Eda Keys, MD Referred by:  Office  PROCEDURE DATE:  07/20/2012 PROCEDURE:  EGD, diagnostic , Maloney Dilation of Esophagus - 75 F ASA CLASS:  Class II INDICATIONS:  dilation of esophageal stricture, dysphagia, abnormal imaging  MEDICATIONS:   MAC sedation, administered by CRNA, propofol (Diprivan) 300 mg IV TOPICAL ANESTHETIC:  none  DESCRIPTION OF PROCEDURE:   After the risks benefits and alternatives of the procedure were thoroughly explained, informed consent was obtained.  The LB [id number] endoscope was introduced through the mouth and advanced to the second portion of the duodenum, without limitations.  The instrument was slowly withdrawn as the mucosa was fully examined. <<PROCEDUREIMAGES>>  A large caliber ring-like stricture was found in the distal esophagus.  Otherwise the examination of the esophagus, stomach (incidental benign fundic gland polyps), and duodenum was normal. Retroflexed views revealed no abnormalities.  THERAPY: 58 F MALONEY DILATOR PASSED W/O RESISTANCE OR HEME. RELOOK EGD WAS UNREMARKABLE (NO TRAUMA)  COMPLICATIONS:  None  ENDOSCOPIC IMPRESSION: 1) Stricture in the distal esophagus - S/P DILATON 71F 2) Otherwise normal examination 3) GERD  RECOMMENDATIONS: 1) Clear liquids until 4PM, then soft foods rest of day. Resume prior diet tomorrow. 2) Continue DEXILANT 3) Call for office visit to be seen in  4-6 weeks. MAY CONSIDER ESOPHAGEAL MANOMETRY IF NO BETTER  ______________________________ Wilhemina Bonito. Eda Keys, MD  CC:  Jacques Navy, MD;  The Patient  n. eSIGNED:   Wilhemina Bonito. Eda Keys at 07/20/2012 02:08 PM  Mitchel Honour, [phone]

## 2012-07-20 NOTE — Progress Notes (Signed)
Patient did not experience any of the following events: a burn prior to discharge; a fall within the facility; wrong site/side/patient/procedure/implant event; or a hospital transfer or hospital admission upon discharge from the facility. (G8907) Patient did not have preoperative order for IV antibiotic SSI prophylaxis. (G8918)  

## 2012-07-23 ENCOUNTER — Telehealth: Payer: Self-pay

## 2012-07-23 ENCOUNTER — Other Ambulatory Visit: Payer: Self-pay

## 2012-07-23 MED ORDER — DEXLANSOPRAZOLE 60 MG PO CPDR: 60.0000 mg | DELAYED_RELEASE_CAPSULE | Freq: Every day | ORAL | Status: DC

## 2012-07-23 NOTE — Telephone Encounter (Signed)
Refilled dexilant.

## 2012-07-23 NOTE — Telephone Encounter (Signed)
  Follow up Call-  Call back number 07/20/2012  Post procedure Call Back phone  # [phone]  Permission to leave phone message Yes     Patient questions:  Do you have a fever, pain , or abdominal swelling? no Pain Score  0 *  Have you tolerated food without any problems? yes  Have you been able to return to your normal activities? yes  Do you have any questions about your discharge instructions: Diet   no Medications  no Follow up visit  no  Do you have questions or concerns about your Care? no  Actions: * If pain score is 4 or above: No action needed, pain <4.

## 2012-08-10 ENCOUNTER — Encounter: Payer: Self-pay | Admitting: Internal Medicine

## 2012-08-21 ENCOUNTER — Other Ambulatory Visit: Payer: Self-pay | Admitting: *Deleted

## 2012-08-21 MED ORDER — LEVOTHYROXINE SODIUM 88 MCG PO TABS: 88.0000 ug | ORAL_TABLET | Freq: Every day | ORAL | Status: DC

## 2012-08-22 ENCOUNTER — Ambulatory Visit: Payer: Medicare Other | Admitting: Internal Medicine

## 2012-08-23 ENCOUNTER — Encounter: Payer: Medicare Other | Admitting: Internal Medicine

## 2012-08-28 ENCOUNTER — Ambulatory Visit (INDEPENDENT_AMBULATORY_CARE_PROVIDER_SITE_OTHER): Payer: Medicare Other | Admitting: Internal Medicine

## 2012-08-28 ENCOUNTER — Encounter: Payer: Self-pay | Admitting: Internal Medicine

## 2012-08-28 VITALS — BP 120/72 | HR 62 | Temp 97.0°F | Resp 16 | Wt 110.0 lb

## 2012-08-28 DIAGNOSIS — M949 Disorder of cartilage, unspecified: Secondary | ICD-10-CM

## 2012-08-28 DIAGNOSIS — M899 Disorder of bone, unspecified: Secondary | ICD-10-CM | POA: Diagnosis not present

## 2012-08-28 DIAGNOSIS — B009 Herpesviral infection, unspecified: Secondary | ICD-10-CM | POA: Diagnosis not present

## 2012-08-28 DIAGNOSIS — Z0000 Encounter for general adult medical examination without abnormal findings: Secondary | ICD-10-CM | POA: Diagnosis not present

## 2012-08-28 DIAGNOSIS — E782 Mixed hyperlipidemia: Secondary | ICD-10-CM

## 2012-08-28 DIAGNOSIS — E039 Hypothyroidism, unspecified: Secondary | ICD-10-CM | POA: Diagnosis not present

## 2012-08-28 DIAGNOSIS — Z23 Encounter for immunization: Secondary | ICD-10-CM

## 2012-08-28 DIAGNOSIS — N952 Postmenopausal atrophic vaginitis: Secondary | ICD-10-CM

## 2012-08-28 DIAGNOSIS — K222 Esophageal obstruction: Secondary | ICD-10-CM

## 2012-08-28 MED ORDER — ZOLPIDEM TARTRATE 5 MG PO TABS: 5.0000 mg | ORAL_TABLET | Freq: Every evening | ORAL | Status: DC | PRN

## 2012-08-28 MED ORDER — VALACYCLOVIR HCL 1 G PO TABS: 1000.0000 mg | ORAL_TABLET | ORAL | Status: DC | PRN

## 2012-08-28 NOTE — Progress Notes (Signed)
Subjective:    Patient ID: Dawn Tucker, female    DOB: [birth year], 66 y.o.   MRN: [id number]  HPI The patient is here for annual Medicare wellness examination and management of other chronic and acute problems. She has been doing well with no acute medical problems, no surgery or injury. She is current with gynecology. Her husband has recently had MI but is making a good recovery. Her family is doing well.    The risk factors are reflected in the social history.  The roster of all physicians providing medical care to patient - is listed in the Snapshot section of the chart.  Activities of daily living:  The patient is 100% inedpendent in all ADLs: dressing, toileting, feeding as well as independent mobility  Home safety : The patient has smoke detectors in the home. They wear seatbelts.  There is no risks for hepatitis, STDs or HIV. There is no   history of blood transfusion. They have no travel history to infectious disease endemic areas of the world recently.  The patient has  seen their dentist in the last six month. They have seen their eye doctor in the last year. They deny any hearing difficulty and have not had audiologic testing in the last year.    They do not  have excessive sun exposure. Discussed the need for sun protection: hats, long sleeves and use of sunscreen if there is significant sun exposure.   Diet: the importance of a healthy diet is discussed. They do have a healthy diet.  The patient has a regular exercise program.    Depression screen: there are no signs or vegative symptoms of depression- irritability, change in appetite, anhedonia, sadness/tearfullness.  Cognitive assessment: the patient manages all their financial and personal affairs and is actively engaged.  The following portions of the patient's history were reviewed and updated as appropriate: allergies, current medications, past family history, past medical history,  past surgical history, past  social history  and problem list.  Vision, hearing, body mass index were assessed and reviewed.   Past Medical History  Diagnosis Date  . CHEST PAIN 03/23/2010  . DEGENERATIVE DISC DISEASE 08/07/2008  . DYSPHAGIA UNSPECIFIED 03/23/2010  . ESOPHAGEAL STRICTURE 01/10/2011  . FIBROIDS, UTERUS 08/07/2008  . GERD 03/23/2010  . HIATAL HERNIA 01/10/2011  . HYPOTHYROIDISM 08/07/2008  . IRRITABLE BOWEL SYNDROME, HX OF 08/07/2008  . Mixed hyperlipidemia 08/07/2008  . OSTEOPENIA 08/06/2008  . PERSONAL HX COLONIC POLYPS 03/23/2010  . Scarlet fever 08/07/2008  . Unspecified pruritic disorder 08/07/2008  . VAGINITIS, ATROPHIC 08/07/2008  . CIN I (cervical intraepithelial neoplasia I)   . Herpes    Past Surgical History  Procedure Date  . Rhinoplasty   . Breast surgery     Breast biopsies right & left- benign lesions  . Cesarean section   . Tubal ligation   . Gynecologic cryosurgery   . Colposcopy   . Colonoscopy    Family History  Problem Relation Age of Onset  . Pneumonia Mother   . Coronary artery disease Father   . Heart disease Father     CAD/MI-fatal  . Breast cancer Paternal Aunt   . Cancer Paternal Aunt     breast cancer  . Prostate cancer Maternal Grandfather   . Cancer Maternal Grandfather     prostate cancer  . Diabetes Neg Hx   . Colon cancer Neg Hx   . Rectal cancer Neg Hx   . Stomach cancer Neg Hx   .  Breast cancer Sister    History   Social History  . Marital Status: Married    Spouse Name: N/A    Number of Children: N/A  . Years of Education: N/A   Occupational History  . Not on file.   Social History Main Topics  . Smoking status: Never Smoker   . Smokeless tobacco: Never Used  . Alcohol Use: No     occassional  . Drug Use: No  . Sexually Active: Yes -- Female partner(s)    Birth Control/ Protection: Post-menopausal   Other Topics Concern  . Not on file   Social History Narrative   HSG, [employer]. Married 68'. 1 son- '79, 1 daughter- '71; 2  grandchildren. Marriage is in good health.Very active in community affairs    Current Outpatient Prescriptions on File Prior to Visit  Medication Sig Dispense Refill  . Calcium-Vitamin D-Vitamin K 500-200-40 MG-UNT-MCG CHEW Chew by mouth daily.        Marland Kitchen dexlansoprazole (DEXILANT) 60 MG capsule Take 1 capsule (60 mg total) by mouth daily.  30 capsule  3  . Estradiol (VAGIFEM) 10 MCG TABS Place 1 tablet (10 mcg total) vaginally 2 (two) times a week.  8 tablet  12  . eszopiclone (LUNESTA) 2 MG TABS Take 2 mg by mouth as needed. Take immediately before bedtime      . levothyroxine (SYNTHROID) 88 MCG tablet Take 1 tablet (88 mcg total) by mouth daily.  30 tablet  1  . Multiple Vitamin (MULTIVITAMIN) tablet Take 1 tablet by mouth daily.        . ranitidine (ZANTAC) 150 MG tablet Take 150 mg by mouth as needed.        . simethicone (MYLICON) 125 MG chewable tablet Chew 125 mg by mouth every 6 (six) hours as needed.        . simvastatin (ZOCOR) 20 MG tablet Take 1 tablet (20 mg total) by mouth daily.  30 tablet  5  . zolpidem (AMBIEN) 5 MG tablet Take 1 tablet (5 mg total) by mouth at bedtime as needed for sleep.  15 tablet  1   Current Facility-Administered Medications on File Prior to Visit  Medication Dose Route Frequency Provider Last Rate Last Dose  . pneumococcal 23 valent vaccine (PNU-IMMUNE) injection 0.5 mL  0.5 mL Intramuscular Once Jacques Navy, MD           Review of Systems Constitutional:  Negative for fever, chills, activity change and unexpected weight change.  HEENT:  Negative for hearing loss, ear pain, congestion, neck stiffness and postnasal drip. Negative for sore throat or swallowing problems. Negative for dental complaints.   Eyes: Negative for vision loss or change in visual acuity.  Respiratory: Negative for chest tightness and wheezing. Negative for DOE.   Cardiovascular: Negative for chest pain or palpitations. No decreased exercise tolerance Gastrointestinal: No  change in bowel habit. No bloating or gas. No reflux or indigestion Genitourinary: Negative for urgency, frequency, flank pain and difficulty urinating.  Musculoskeletal: Negative for myalgias, back pain, arthralgias and gait problem.  Neurological: Negative for dizziness, tremors, weakness and headaches.  Hematological: Negative for adenopathy.  Psychiatric/Behavioral: Negative for behavioral problems and dysphoric mood.       Objective:   Physical Exam Filed Vitals:   08/28/12 1438  BP: 120/72  Pulse: 62  Temp: 97 F (36.1 C)  Resp: 16   Wt Readings from Last 3 Encounters:  08/28/12 110 lb (49.896 kg)  07/20/12 110 lb (  49.896 kg)  07/17/12 110 lb (49.896 kg)   Gen'l: well nourished, well developed white woman in no distress HEENT - [place]/AT, EACs/TMs normal, oropharynx with native dentition in good condition, no buccal or palatal lesions, posterior pharynx clear, mucous membranes moist. C&S clear, PERRLA, fundi - normal Neck - supple, no thyromegaly Nodes- negative submental, cervical, supraclavicular regions Chest - no deformity, no CVAT Lungs - cleat without rales, wheezes. No increased work of breathing Breast - defeerred to Dr. Chevis Pretty Cardiovascular - regular rate and rhythm, quiet precordium, no murmurs, rubs or gallops, 2+ radial, DP and PT pulses Abdomen - BS+ x 4, no HSM, no guarding or rebound or tenderness Pelvic - deferred to Dr. Chevis Pretty Rectal - deferred to Dr. Chevis Pretty Extremities - no clubbing, cyanosis, edema or deformity.  Neuro - A&O x 3, CN II-XII normal, motor strength normal and equal, DTRs 2+ and symmetrical biceps, radial, and patellar tendons. Cerebellar - no tremor, no rigidity, fluid movement and normal gait. Derm - Head, neck, back, abdomen and extremities without suspicious lesions  Labs ordered and pending.      Assessment & Plan:

## 2012-08-29 NOTE — Assessment & Plan Note (Signed)
Current with DEXA scanning. She does weight bearing exercise, has adequate calcium in her diet and supplements and takes adequate vitamin D.

## 2012-08-29 NOTE — Assessment & Plan Note (Signed)
Lipid panel in '12 was excellent. She will return for follow up lab with recommendations to follow.

## 2012-08-29 NOTE — Assessment & Plan Note (Signed)
Stable on topical hormone cream. Has had recent exam with Dr. Chevis Pretty

## 2012-08-29 NOTE — Assessment & Plan Note (Signed)
Lab Results  Component Value Date   TSH 0.63 08/10/2011   Due for follow up lab with recommendations to follow. In the meantime she will continue her present dose of levothyroxine.

## 2012-08-29 NOTE — Assessment & Plan Note (Signed)
Last EGD with dilation August 2, '13. At that time she had progressed to where she could only swallow liquids.  Plan She is strongly advised to seek care before she gets to where she can only consume fluids. Explained the risks of impaction  Follow-up with Dr. Marina Goodell.

## 2012-08-29 NOTE — Assessment & Plan Note (Signed)
Has periodic fever blisters. She has a great response to Valtrex 2 g AM/PM x 1 day.  Plan Refill Rx done

## 2012-08-29 NOTE — Assessment & Plan Note (Signed)
Interval medical history is unremarkable. Limited physical, sans breast and pelvic, is normal. She is current with colorectal and breast cancer screening. Immunizations are up to date.  In summary - a delightful woman who is medically stable and doing well. She had EKG last year with no indication for repeat. She is welcomed to Medicare.

## 2012-08-31 ENCOUNTER — Ambulatory Visit (INDEPENDENT_AMBULATORY_CARE_PROVIDER_SITE_OTHER): Payer: Medicare Other | Admitting: Internal Medicine

## 2012-08-31 ENCOUNTER — Other Ambulatory Visit (INDEPENDENT_AMBULATORY_CARE_PROVIDER_SITE_OTHER): Payer: Medicare Other

## 2012-08-31 ENCOUNTER — Encounter: Payer: Self-pay | Admitting: Internal Medicine

## 2012-08-31 VITALS — BP 118/70 | HR 63 | Ht 63.0 in | Wt 110.4 lb

## 2012-08-31 DIAGNOSIS — E039 Hypothyroidism, unspecified: Secondary | ICD-10-CM | POA: Diagnosis not present

## 2012-08-31 DIAGNOSIS — K222 Esophageal obstruction: Secondary | ICD-10-CM | POA: Diagnosis not present

## 2012-08-31 DIAGNOSIS — K219 Gastro-esophageal reflux disease without esophagitis: Secondary | ICD-10-CM

## 2012-08-31 DIAGNOSIS — E782 Mixed hyperlipidemia: Secondary | ICD-10-CM | POA: Diagnosis not present

## 2012-08-31 DIAGNOSIS — IMO0002 Reserved for concepts with insufficient information to code with codable children: Secondary | ICD-10-CM | POA: Diagnosis not present

## 2012-08-31 DIAGNOSIS — R1310 Dysphagia, unspecified: Secondary | ICD-10-CM | POA: Diagnosis not present

## 2012-08-31 LAB — CBC WITH DIFFERENTIAL/PLATELET
Basophils Absolute: 0 10*3/uL (ref 0.0–0.1)
Eosinophils Absolute: 0.1 10*3/uL (ref 0.0–0.7)
HCT: 39.4 % (ref 36.0–46.0)
Hemoglobin: 13 g/dL (ref 12.0–15.0)
Lymphocytes Relative: 31.3 % (ref 12.0–46.0)
Lymphs Abs: 1.7 10*3/uL (ref 0.7–4.0)
MCHC: 33.1 g/dL (ref 30.0–36.0)
Neutro Abs: 3 10*3/uL (ref 1.4–7.7)
Platelets: 222 10*3/uL (ref 150.0–400.0)
RDW: 13.3 % (ref 11.5–14.6)

## 2012-08-31 LAB — COMPREHENSIVE METABOLIC PANEL
Albumin: 4.3 g/dL (ref 3.5–5.2)
BUN: 16 mg/dL (ref 6–23)
CO2: 27 mEq/L (ref 19–32)
Calcium: 9.4 mg/dL (ref 8.4–10.5)
GFR: 49.7 mL/min — ABNORMAL LOW (ref >60.00)
Glucose, Bld: 105 mg/dL — ABNORMAL HIGH (ref 70–99)
Potassium: 5.1 mEq/L (ref 3.5–5.1)
Sodium: 136 mEq/L (ref 135–145)
Total Protein: 7.4 g/dL (ref 6.0–8.3)

## 2012-08-31 LAB — HEPATIC FUNCTION PANEL
ALT: 20 U/L (ref 0–35)
Total Bilirubin: 0.8 mg/dL (ref 0.3–1.2)

## 2012-08-31 LAB — LIPID PANEL: Cholesterol: 182 mg/dL (ref 0–200)

## 2012-08-31 LAB — TSH: TSH: 0.16 u[IU]/mL — ABNORMAL LOW (ref 0.35–5.50)

## 2012-08-31 MED ORDER — DEXLANSOPRAZOLE 60 MG PO CPDR: 60.0000 mg | DELAYED_RELEASE_CAPSULE | Freq: Every day | ORAL | Status: DC

## 2012-08-31 NOTE — Progress Notes (Signed)
HISTORY OF PRESENT ILLNESS:  Dawn Tucker is a 66 y.o. female who presents today for followup. She was seen in July for problems with dysphagia. 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 As well narrowing of the distal esophagus with impedance of a 13 mm pill. She subsequently underwent upper endoscopy on 07/20/2012. Large caliber distal ring noted. No other abnormalities. 58 French Maloney dilator passed. She presents today for followup. She reports immediate improvement in swallowing post dilation. Still has had a few episodes of minor dysphagia. She continues on Dexilant. She is pleased  REVIEW OF SYSTEMS:  All non-GI ROS negative except for  Past Medical History  Diagnosis Date  . CHEST PAIN 03/23/2010  . DEGENERATIVE DISC DISEASE 08/07/2008  . DYSPHAGIA UNSPECIFIED 03/23/2010  . ESOPHAGEAL STRICTURE 01/10/2011  . FIBROIDS, UTERUS 08/07/2008  . GERD 03/23/2010  . HIATAL HERNIA 01/10/2011  . HYPOTHYROIDISM 08/07/2008  . IRRITABLE BOWEL SYNDROME, HX OF 08/07/2008  . Mixed hyperlipidemia 08/07/2008  . OSTEOPENIA 08/06/2008  . PERSONAL HX COLONIC POLYPS 03/23/2010  . Scarlet fever 08/07/2008  . Unspecified pruritic disorder 08/07/2008  . VAGINITIS, ATROPHIC 08/07/2008  . CIN I (cervical intraepithelial neoplasia I)   . Herpes     Past Surgical History  Procedure Date  . Rhinoplasty   . Breast surgery     Breast biopsies right & left- benign lesions  . Cesarean section   . Tubal ligation   . Gynecologic cryosurgery   . Colposcopy   . Colonoscopy     Social History PADEN KURAS  reports that she has never smoked. She has never used smokeless tobacco. She reports that she does not drink alcohol or use illicit drugs.  family history includes Breast cancer in her paternal aunt and sister; Cancer in her maternal grandfather and paternal aunt; Coronary artery disease in her father;  Heart disease in her father; Pneumonia in her mother; and Prostate cancer in her maternal grandfather.  There is no history of Diabetes, and Colon cancer, and Rectal cancer, and Stomach cancer, .  Allergies  Allergen Reactions  . Sulfonamide Derivatives Other (See Comments)    REACTION AS A CHILD       PHYSICAL EXAMINATION: Vital signs: BP 118/70  Pulse 63  Ht 5\' 3"  (1.6 m)  Wt 110 lb 6.4 oz (50.077 kg)  BMI 19.56 kg/m2  SpO2 98% General: Well-developed, well-nourished, no acute distress Abdomen: not reexamined Psychiatric: alert and oriented x3. Cooperative    ASSESSMENT:  #1. Dysphagia. Problem seem in part due to known distal stricture as well as likely motility component. Significant improvement post dilation. #2. GERD. Improved on PPI #3. Colon cancer screening. Up-to-date. Due for followup 2015   PLAN:  #1. Continue PPI. Dexilant prescription refilled for one year #2. Continue reflux precautions #3. Surveillance colonoscopy 2015. Interval followup as needed

## 2012-08-31 NOTE — Patient Instructions (Addendum)
We have sent the following medications to your pharmacy for you to pick up at your convenience:  Dexilant  Please follow up with Dr. Marina Goodell as needed

## 2012-09-02 ENCOUNTER — Encounter: Payer: Self-pay | Admitting: Internal Medicine

## 2012-09-02 MED ORDER — LEVOTHYROXINE SODIUM 75 MCG PO TABS: 75.0000 ug | ORAL_TABLET | Freq: Every day | ORAL | Status: DC

## 2012-11-20 ENCOUNTER — Other Ambulatory Visit: Payer: Self-pay | Admitting: Internal Medicine

## 2013-01-01 ENCOUNTER — Encounter: Payer: Self-pay | Admitting: Gynecology

## 2013-01-01 ENCOUNTER — Ambulatory Visit (INDEPENDENT_AMBULATORY_CARE_PROVIDER_SITE_OTHER): Payer: Medicare Other | Admitting: Gynecology

## 2013-01-01 VITALS — BP 112/66 | Ht 63.0 in | Wt 110.0 lb

## 2013-01-01 DIAGNOSIS — N952 Postmenopausal atrophic vaginitis: Secondary | ICD-10-CM | POA: Diagnosis not present

## 2013-01-01 DIAGNOSIS — L293 Anogenital pruritus, unspecified: Secondary | ICD-10-CM | POA: Diagnosis not present

## 2013-01-01 DIAGNOSIS — D259 Leiomyoma of uterus, unspecified: Secondary | ICD-10-CM

## 2013-01-01 DIAGNOSIS — M8580 Other specified disorders of bone density and structure, unspecified site: Secondary | ICD-10-CM

## 2013-01-01 DIAGNOSIS — M949 Disorder of cartilage, unspecified: Secondary | ICD-10-CM

## 2013-01-01 MED ORDER — ESTRADIOL 10 MCG VA TABS: 10.0000 ug | ORAL_TABLET | VAGINAL | Status: DC

## 2013-01-01 MED ORDER — NYSTATIN-TRIAMCINOLONE 100000-0.1 UNIT/GM-% EX OINT: TOPICAL_OINTMENT | Freq: Two times a day (BID) | CUTANEOUS | Status: DC

## 2013-01-01 NOTE — Progress Notes (Signed)
DESERI LOSS 1945-12-22 [id number]        67 y.o.  [id number] for follow up exam.  Former patient of Dr. Verl Dicker  Past medical history,surgical history, medications, allergies, family history and social history were all reviewed and documented in the EPIC chart. ROS:  Was performed and pertinent positives and negatives are included in the history.  Exam: Kim assistant Filed Vitals:   01/01/13 1221  BP: 112/66  Height: 5\' 3"  (1.6 m)  Weight: 110 lb (49.896 kg)   General appearance  Normal Skin grossly normal Head/Neck normal with no cervical or supraclavicular adenopathy thyroid normal Lungs  clear Cardiac RR, without RMG Abdominal  soft, nontender, without masses, organomegaly or hernia Breasts  examined lying and sitting without masses, retractions, discharge or axillary adenopathy. Pelvic  Ext/BUS/vagina  Atrophic changes  Cervix  normal   Uterus  axial, normal size, shape and contour, midline and mobile nontender   Adnexa  Without masses or tenderness    Anus and perineum  normal   Rectovaginal  normal sphincter tone without palpated masses or tenderness.    Assessment/Plan:  67 y.o. [id number] female for follow up exam.   1. Atrophic vaginitis.  Patient currently on Vagifem. Doing well with this and wants to continue. I reviewed alternatives to include Osphena. Issues of absorption risks to include the WHI study with stroke heart attack DVT breast cancer and uterine cancer. Limited absorption with Vagifem reviewed. Patient was to continue and I refilled her Vagifem 10 mcg x1 year. 2. Pruritus ani.  Occasional symptoms last for a day or 2 and then resolves. Exam is normal.  We'll try Mytrex cream when necessary one tube with one refill provided. 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 Exam today is normal and we'll plan expectant management. 4. Osteopenia.  DEXA 12/2010 with T score -2.3. Fax 10%/2%. Had been on Fosamax previously but is  several years out from a drug-free holiday. Repeat DEXA now and if stable continue expected management. Increase calcium vitamin D reviewed. 5. Family history breast cancer.  Paternal aunt had breast cancer we she was in her 30s and then developed ovarian cancer when she was in her 78s. The and had 3 daughters none of which developed breast ovarian or other cancers. The patient's sister developed breast cancer at age 72. All the family members are of Jewish ancestry. The maternal aunt and her family were not genetically tested but the patient's sister was in is negative for BRCA1 and 2. Dr. Eda Paschal has talked to the patient previously about genetic testing and offered and the patient has declined to this point. I again reviewed the issues to include if she was gene positive prophylactic mastectomy and BSO options. Even if her sister was negative still has the possibility that she could be positive. Offered testing now versus referral to a Dentist at [facility] to rediscuss the issue. Patient unsure does, decide today and will call back if she decides to pursue either direct testing or genetic counseling. I did recommend when she is due for her mammogram this March 2 ask for 3-D tomosynthesis. The issues of MRI screening was discussed also. 6. Pap smear 12/2011.  No Pap smear done today.  History of low-grade dysplasia status post cryosurgery a number of years ago by Dr. Lauralee Evener. Follow up Pap smears have all been normal. Options to stop screening altogether if she is over the age of 43 versus less frequent  screening reviewed. We'll readdress on an annual basis. 7. Colonoscopy 2008.  Arranged through Dr. Debby Bud office and will follow up with them when do. 8. Health maintenance.  Actively sees Dr. Debby Bud and no blood work done today as it was all done through his office. Follow up one year, sooner as needed.    Dara Lords MD, 2:09 PM 01/01/2013

## 2013-01-01 NOTE — Patient Instructions (Addendum)
Follow up for bone density as scheduled Recommend 3-D mammography when scheduling in March 2014. Call me if you want to pursue genetic testing or referral to a genetic counselor reference to the breast cancer gene. Follow up in one year for annual gynecologic exam.

## 2013-01-02 LAB — URINALYSIS W MICROSCOPIC + REFLEX CULTURE
Casts: NONE SEEN
Hgb urine dipstick: NEGATIVE
Leukocytes, UA: NEGATIVE
Nitrite: NEGATIVE
Specific Gravity, Urine: 1.01 (ref 1.005–1.030)
pH: 6.5 (ref 5.0–8.0)

## 2013-01-23 ENCOUNTER — Other Ambulatory Visit: Payer: Self-pay | Admitting: Gynecology

## 2013-01-23 DIAGNOSIS — Z1231 Encounter for screening mammogram for malignant neoplasm of breast: Secondary | ICD-10-CM

## 2013-02-22 ENCOUNTER — Ambulatory Visit: Payer: Medicare Other

## 2013-02-26 ENCOUNTER — Ambulatory Visit
Admission: RE | Admit: 2013-02-26 | Discharge: 2013-02-26 | Disposition: A | Discharge status: Home / Self Care | Payer: Medicare Other | Source: Ambulatory Visit | Attending: Gynecology | Admitting: Gynecology

## 2013-02-26 DIAGNOSIS — Z1231 Encounter for screening mammogram for malignant neoplasm of breast: Secondary | ICD-10-CM | POA: Diagnosis not present

## 2013-03-18 ENCOUNTER — Ambulatory Visit (INDEPENDENT_AMBULATORY_CARE_PROVIDER_SITE_OTHER): Payer: Medicare Other

## 2013-03-18 DIAGNOSIS — Z23 Encounter for immunization: Secondary | ICD-10-CM | POA: Diagnosis not present

## 2013-03-19 ENCOUNTER — Ambulatory Visit (INDEPENDENT_AMBULATORY_CARE_PROVIDER_SITE_OTHER): Payer: Medicare Other

## 2013-03-19 ENCOUNTER — Encounter: Payer: Self-pay | Admitting: Gynecology

## 2013-03-19 DIAGNOSIS — M8580 Other specified disorders of bone density and structure, unspecified site: Secondary | ICD-10-CM

## 2013-03-19 DIAGNOSIS — M949 Disorder of cartilage, unspecified: Secondary | ICD-10-CM

## 2013-04-22 ENCOUNTER — Other Ambulatory Visit: Payer: Self-pay

## 2013-04-22 MED ORDER — SIMVASTATIN 20 MG PO TABS: 20.0000 mg | ORAL_TABLET | Freq: Every day | ORAL | Status: DC

## 2013-04-22 MED ORDER — VALACYCLOVIR HCL 1 G PO TABS: 1000.0000 mg | ORAL_TABLET | ORAL | Status: DC | PRN

## 2013-06-19 ENCOUNTER — Other Ambulatory Visit: Payer: Self-pay | Admitting: Internal Medicine

## 2013-07-08 ENCOUNTER — Other Ambulatory Visit: Payer: Self-pay | Admitting: Dermatology

## 2013-07-08 DIAGNOSIS — L723 Sebaceous cyst: Secondary | ICD-10-CM | POA: Diagnosis not present

## 2013-07-08 DIAGNOSIS — D485 Neoplasm of uncertain behavior of skin: Secondary | ICD-10-CM | POA: Diagnosis not present

## 2013-07-08 DIAGNOSIS — D239 Other benign neoplasm of skin, unspecified: Secondary | ICD-10-CM | POA: Diagnosis not present

## 2013-07-08 DIAGNOSIS — Z808 Family history of malignant neoplasm of other organs or systems: Secondary | ICD-10-CM | POA: Diagnosis not present

## 2013-07-08 DIAGNOSIS — B009 Herpesviral infection, unspecified: Secondary | ICD-10-CM | POA: Diagnosis not present

## 2013-07-18 ENCOUNTER — Other Ambulatory Visit: Payer: Self-pay | Admitting: Internal Medicine

## 2013-07-19 ENCOUNTER — Encounter: Payer: Self-pay | Admitting: Internal Medicine

## 2013-07-19 NOTE — Telephone Encounter (Signed)
Zolpidem has been called to pharmacy  

## 2013-08-15 ENCOUNTER — Other Ambulatory Visit: Payer: Self-pay | Admitting: Internal Medicine

## 2013-08-22 DIAGNOSIS — H2510 Age-related nuclear cataract, unspecified eye: Secondary | ICD-10-CM | POA: Diagnosis not present

## 2013-09-10 ENCOUNTER — Other Ambulatory Visit: Payer: Self-pay | Admitting: Internal Medicine

## 2013-09-16 ENCOUNTER — Ambulatory Visit (INDEPENDENT_AMBULATORY_CARE_PROVIDER_SITE_OTHER): Payer: Medicare Other | Admitting: Internal Medicine

## 2013-09-16 ENCOUNTER — Encounter: Payer: Self-pay | Admitting: Internal Medicine

## 2013-09-16 ENCOUNTER — Other Ambulatory Visit (INDEPENDENT_AMBULATORY_CARE_PROVIDER_SITE_OTHER): Payer: Medicare Other

## 2013-09-16 VITALS — BP 112/78 | HR 68 | Temp 98.3°F | Ht 63.0 in | Wt 113.0 lb

## 2013-09-16 DIAGNOSIS — IMO0002 Reserved for concepts with insufficient information to code with codable children: Secondary | ICD-10-CM

## 2013-09-16 DIAGNOSIS — K219 Gastro-esophageal reflux disease without esophagitis: Secondary | ICD-10-CM | POA: Diagnosis not present

## 2013-09-16 DIAGNOSIS — N952 Postmenopausal atrophic vaginitis: Secondary | ICD-10-CM

## 2013-09-16 DIAGNOSIS — E782 Mixed hyperlipidemia: Secondary | ICD-10-CM | POA: Diagnosis not present

## 2013-09-16 DIAGNOSIS — B009 Herpesviral infection, unspecified: Secondary | ICD-10-CM

## 2013-09-16 DIAGNOSIS — E039 Hypothyroidism, unspecified: Secondary | ICD-10-CM

## 2013-09-16 DIAGNOSIS — Z0000 Encounter for general adult medical examination without abnormal findings: Secondary | ICD-10-CM | POA: Diagnosis not present

## 2013-09-16 DIAGNOSIS — R1310 Dysphagia, unspecified: Secondary | ICD-10-CM

## 2013-09-16 DIAGNOSIS — Z86010 Personal history of colonic polyps: Secondary | ICD-10-CM

## 2013-09-16 DIAGNOSIS — Z23 Encounter for immunization: Secondary | ICD-10-CM

## 2013-09-16 DIAGNOSIS — M899 Disorder of bone, unspecified: Secondary | ICD-10-CM

## 2013-09-16 LAB — LIPID PANEL
HDL: 65.2 mg/dL (ref >39.00)
LDL Cholesterol: 87 mg/dL (ref 0–99)
Total CHOL/HDL Ratio: 3
Triglycerides: 139 mg/dL (ref 0.0–149.0)

## 2013-09-16 LAB — COMPREHENSIVE METABOLIC PANEL
AST: 37 U/L (ref 0–37)
Alkaline Phosphatase: 65 U/L (ref 39–117)
BUN: 16 mg/dL (ref 6–23)
Calcium: 9.4 mg/dL (ref 8.4–10.5)
Chloride: 101 mEq/L (ref 96–112)
Creatinine, Ser: 1.1 mg/dL (ref 0.4–1.2)
GFR: 53.8 mL/min — ABNORMAL LOW (ref >60.00)
Total Bilirubin: 0.7 mg/dL (ref 0.3–1.2)

## 2013-09-16 LAB — HEPATIC FUNCTION PANEL
ALT: 49 U/L — ABNORMAL HIGH (ref 0–35)
AST: 37 U/L (ref 0–37)
Alkaline Phosphatase: 65 U/L (ref 39–117)
Bilirubin, Direct: 0.1 mg/dL (ref 0.0–0.3)
Total Bilirubin: 0.7 mg/dL (ref 0.3–1.2)
Total Protein: 7.5 g/dL (ref 6.0–8.3)

## 2013-09-16 NOTE — Patient Instructions (Addendum)
Thanks for coming in. You appear to be healthy and fit, keep up the good work.  Routine lab today and results will be posted to MyChart  For Prevnar pneumonia vaccine today - this is complete coverage. Once and done.

## 2013-09-16 NOTE — Progress Notes (Signed)
Subjective:    Patient ID: Dawn Tucker, female    DOB: 19-Oct-1946, 67 y.o.   MRN: [id number]  HPI The patient is here for annual Medicare wellness examination and management of other chronic and acute problems.  In the interval she has been doing well. She had an esophageal dilation 12 months ago and has done well. Seen by Dr. Audie Box in January - normal exam. She has had mammogram. She has been enjoying good health.    The risk factors are reflected in the social history.  The roster of all physicians providing medical care to patient - is listed in the Snapshot section of the chart.  Activities of daily living:  The patient is 100% inedpendent in all ADLs: dressing, toileting, feeding as well as independent mobility  Home safety : The patient has smoke detectors in the home. Falls - no falls. Home is fall safe.  They wear seatbelts.  firearms are present in the home, kept in a safe fashion. There is no violence in the home.   There is no risks for hepatitis, STDs or HIV. There is no history of blood transfusion. They have no travel history to infectious disease endemic areas of the world.  The patient has seen their dentist in the last six month. They have seen their eye doctor in the last year. They deny any hearing difficulty and have not had audiologic testing in the last year.  They do not  have excessive sun exposure. Discussed the need for sun protection: hats, long sleeves and use of sunscreen if there is significant sun exposure.   Diet: the importance of a healthy diet is discussed. They do have a healthy diet.  The patient has a regular exercise program: core exercise, eliptical, light weight training , 60 min duration, 6 per week.    Depression screen: there are no signs or vegative symptoms of depression- irritability, change in appetite, anhedonia, sadness/tearfullness.  Cognitive assessment: the patient manages all their financial and personal affairs and is actively  engaged.   The following portions of the patient's history were reviewed and updated as appropriate: allergies, current medications, past family history, past medical history,  past surgical history, past social history  and problem list.  Past Medical History  Diagnosis Date  . CHEST PAIN 03/23/2010  . DEGENERATIVE DISC DISEASE 08/07/2008  . DYSPHAGIA UNSPECIFIED 03/23/2010  . ESOPHAGEAL STRICTURE 01/10/2011  . FIBROIDS, UTERUS 08/07/2008  . GERD 03/23/2010  . HIATAL HERNIA 01/10/2011  . HYPOTHYROIDISM 08/07/2008  . IRRITABLE BOWEL SYNDROME, HX OF 08/07/2008  . Mixed hyperlipidemia 08/07/2008  . OSTEOPENIA 03/2013    T score -2.4 FRAX 11%/2.5%  . PERSONAL HX COLONIC POLYPS 03/23/2010  . Scarlet fever 08/07/2008  . Unspecified pruritic disorder 08/07/2008  . VAGINITIS, ATROPHIC 08/07/2008  . Herpes    Past Surgical History  Procedure Laterality Date  . Rhinoplasty    . Breast surgery      Breast biopsies right & left- benign lesions  . Cesarean section    . Tubal ligation    . Colposcopy    . Colonoscopy    . Gynecologic cryosurgery  by Dr. Lauralee Evener    low-grade dysplasia   Family History  Problem Relation Age of Onset  . Pneumonia Mother   . Coronary artery disease Father   . Heart disease Father     CAD/MI-fatal  . Breast cancer Paternal Aunt     Age 37  . Cancer Paternal Aunt  breast cancer  . Ovarian cancer Paternal Aunt   . Prostate cancer Maternal Grandfather   . Cancer Maternal Grandfather     prostate cancer  . Diabetes Neg Hx   . Colon cancer Neg Hx   . Rectal cancer Neg Hx   . Stomach cancer Neg Hx   . Breast cancer Sister     Age 31   History   Social History  . Marital Status: Married    Spouse Name: N/A    Number of Children: N/A  . Years of Education: N/A   Occupational History  . Not on file.   Social History Main Topics  . Smoking status: Never Smoker   . Smokeless tobacco: Never Used  . Alcohol Use: Yes     Comment: occassional  . Drug Use:  No  . Sexual Activity: Yes    Partners: Male    Birth Control/ Protection: Post-menopausal   Other Topics Concern  . Not on file   Social History Narrative   HSG, [employer]. Married 68'. 1 son- '79, 1 daughter- '71; 2 grandchildren. Marriage is in good health.Very active in community affairs    Current Outpatient Prescriptions on File Prior to Visit  Medication Sig Dispense Refill  . Calcium-Vitamin D-Vitamin K 500-200-40 MG-UNT-MCG CHEW Chew by mouth daily.        Marland Kitchen DEXILANT 60 MG capsule TAKE ONE CAPSULE BY MOUTH DAILY.  30 capsule  3  . Estradiol (VAGIFEM) 10 MCG TABS Place 1 tablet (10 mcg total) vaginally 2 (two) times a week.  8 tablet  12  . Multiple Vitamin (MULTIVITAMIN) tablet Take 1 tablet by mouth daily.        . ranitidine (ZANTAC) 150 MG tablet Take 150 mg by mouth as needed.        . simethicone (MYLICON) 125 MG chewable tablet Chew 125 mg by mouth every 6 (six) hours as needed.        . simvastatin (ZOCOR) 20 MG tablet Take 1 tablet (20 mg total) by mouth daily.  30 tablet  5  . SYNTHROID 75 MCG tablet TAKE ONE TABLET BY MOUTH ONE TIME DAILY. MUST MAKE OFFICE VISIT TO SEE DOCTOR. NORINS FOR FURTHER REFILLS.  30 tablet  0  . valACYclovir (VALTREX) 1000 MG tablet Take 1 tablet (1,000 mg total) by mouth as needed. Take 2 tabs AM, 2 tabs PM for one day for fever  blister  360 tablet  0  . zolpidem (AMBIEN) 5 MG tablet TAKE ONE TABLET BY MOUTH AT BEDTIME AS NEEDED FOR SLEEP  15 tablet  0   Current Facility-Administered Medications on File Prior to Visit  Medication Dose Route Frequency Provider Last Rate Last Dose  . pneumococcal 23 valent vaccine (PNU-IMMUNE) injection 0.5 mL  0.5 mL Intramuscular Once Jacques Navy, MD         Vision, hearing, body mass index were assessed and reviewed.   During the course of the visit the patient was educated and counseled about appropriate screening and preventive services including : fall prevention , diabetes screening,  nutrition counseling, colorectal cancer screening, and recommended immunizations.     Review of Systems Constitutional:  Negative for fever, chills, activity change and unexpected weight change.  HEENT:  Negative for major hearing loss, ear pain, congestion, neck stiffness and postnasal drip. Negative for sore throat or swallowing problems. Negative for dental complaints.   Eyes: Negative for vision loss or change in visual acuity.  Respiratory: Negative for chest  tightness and wheezing. Negative for DOE.   Cardiovascular: Negative for chest pain or palpitations. No decreased exercise tolerance Gastrointestinal: No change in bowel habit. No bloating or gas. No reflux or indigestion Genitourinary: Negative for urgency, frequency, flank pain and difficulty urinating.  Musculoskeletal: Negative for myalgias, back pain, arthralgias and gait problem.  Neurological: Negative for dizziness, tremors, weakness and headaches.  Hematological: Negative for adenopathy.  Psychiatric/Behavioral: Negative for behavioral problems and dysphoric mood.       Objective:   Physical Exam Filed Vitals:   09/16/13 1343  BP: 112/78  Pulse: 68  Temp: 98.3 F (36.8 C)   Wt Readings from Last 3 Encounters:  09/16/13 113 lb (51.256 kg)  01/01/13 110 lb (49.896 kg)  08/31/12 110 lb 6.4 oz (50.077 kg)   Gen'l: well nourished, well developed Woman in no distress, looking younger than her stated age. HEENT - [place]/AT, EACs/TMs normal, oropharynx with native dentition in good condition, no buccal or palatal lesions, posterior pharynx clear, mucous membranes moist. C&S clear, PERRLA, fundi - normal Neck - supple, no thyromegaly Nodes- negative submental, cervical, supraclavicular regions Chest - no deformity, no CVAT Lungs - clear without rales, wheezes. No increased work of breathing Breast - deferred to Dr. Audie Box Cardiovascular - regular rate and rhythm, quiet precordium, no murmurs, rubs or gallops, 2+  radial, DP and PT pulses Abdomen - BS+ x 4, no HSM, no guarding or rebound or tenderness Pelvic - deferred to Dr. Audie Box Rectal - deferred to Dr. Marina Goodell - GI Extremities - no clubbing, cyanosis, edema or deformity.  Neuro - A&O x 3, CN II-XII normal, motor strength normal and equal, DTRs 2+ and symmetrical biceps, radial, and patellar tendons. Cerebellar - no tremor, no rigidity, fluid movement and normal gait. Derm - Head, neck, back, abdomen and extremities without suspicious lesions  Recent Results (from the past 2160 hour(s))  HEPATIC FUNCTION PANEL     Status: Abnormal   Collection Time    09/16/13  2:37 PM      Result Value Range   Total Bilirubin 0.7  0.3 - 1.2 mg/dL   Bilirubin, Direct 0.1  0.0 - 0.3 mg/dL   Alkaline Phosphatase 65  39 - 117 U/L   AST 37  0 - 37 U/L   ALT 49 (*) 0 - 35 U/L   Total Protein 7.5  6.0 - 8.3 g/dL   Albumin 4.4  3.5 - 5.2 g/dL  TSH     Status: None   Collection Time    09/16/13  2:37 PM      Result Value Range   TSH 1.19  0.35 - 5.50 uIU/mL  COMPREHENSIVE METABOLIC PANEL     Status: Abnormal   Collection Time    09/16/13  2:37 PM      Result Value Range   Sodium 137  135 - 145 mEq/L   Potassium 4.4  3.5 - 5.1 mEq/L   Chloride 101  96 - 112 mEq/L   CO2 32  19 - 32 mEq/L   Glucose, Bld 110 (*) 70 - 99 mg/dL   BUN 16  6 - 23 mg/dL   Creatinine, Ser 1.1  0.4 - 1.2 mg/dL   Total Bilirubin 0.7  0.3 - 1.2 mg/dL   Alkaline Phosphatase 65  39 - 117 U/L   AST 37  0 - 37 U/L   ALT 49 (*) 0 - 35 U/L   Total Protein 7.5  6.0 - 8.3 g/dL   Albumin 4.4  3.5 - 5.2 g/dL   Calcium 9.4  8.4 - 16.1 mg/dL   GFR 09.60 (*) >45.40 mL/min  LIPID PANEL     Status: None   Collection Time    09/16/13  2:37 PM      Result Value Range   Cholesterol 180  0 - 200 mg/dL   Comment: ATP III Classification       Desirable:  < 200 mg/dL               Borderline High:  200 - 239 mg/dL          High:  > = 981 mg/dL   Triglycerides 191.4  0.0 - 149.0 mg/dL   Comment:  Normal:  <782 mg/dLBorderline High:  150 - 199 mg/dL   HDL 95.62  >13.08 mg/dL   VLDL 65.7  0.0 - 84.6 mg/dL   LDL Cholesterol 87  0 - 99 mg/dL   Total CHOL/HDL Ratio 3     Comment:                Men          Women1/2 Average Risk     3.4          3.3Average Risk          5.0          4.42X Average Risk          9.6          7.13X Average Risk          15.0          11.0                      HEMOGLOBIN AND HEMATOCRIT, BLOOD     Status: None   Collection Time    09/16/13  2:37 PM      Result Value Range   Hemoglobin 12.9  12.0 - 15.0 g/dL   HCT 96.2  95.2 - 84.1 %  T4, FREE     Status: None   Collection Time    09/16/13  2:37 PM      Result Value Range   Free T4 0.90  0.60 - 1.60 ng/dL           Assessment & Plan:

## 2013-09-17 NOTE — Assessment & Plan Note (Signed)
Stable DDD lumbar spine with no c/o pain or limitations in her normal activity, including training.

## 2013-09-17 NOTE — Assessment & Plan Note (Signed)
Interval history - no major illness, surgery or injury. She is current with gyn. Limited physical exam is normal. Labs reviewed - normal range results with good control of thyroid and cholesterol levels. Current with colorectal cancer screening, breast and cervical cancer screening. Immunizations are up to date.  IN summary A delightful woman who appears to be medically stable at today's exam.

## 2013-09-17 NOTE — Assessment & Plan Note (Signed)
Esophageal dilation Augut '13 - is doing well at todays visit but she is careful when she eats: small bites, liquid chasers.

## 2013-09-17 NOTE — Assessment & Plan Note (Signed)
Doing well w/ no recent out breaks.  Plan  Continue Valtrex 2 g bid x 1 day as needed.

## 2013-09-17 NOTE — Assessment & Plan Note (Signed)
Lab Results  Component Value Date   TSH 1.19 09/16/2013   In normal range - good control of thyroid function.  Plan Continue present medication

## 2013-09-17 NOTE — Assessment & Plan Note (Signed)
DEXa Jan '12 compared to April '14 - no significant change in T-scores AP spine or Left and right femoral neck: osteopenia spine and left femoral neck; borderline osteoporosis right femoral neck  Plan Continue exercise regimen  Continue calcium and Vit D  No additional medical intervention indicated.

## 2013-09-17 NOTE — Assessment & Plan Note (Signed)
Per Dr. Audie Box.

## 2013-09-17 NOTE — Assessment & Plan Note (Signed)
She did have a hyperplastic polyp in '07.  Plan Follow up interval per Dr. Marina Goodell

## 2013-09-17 NOTE — Assessment & Plan Note (Signed)
Lab reveals good control of LDL well below goal of 100 or less. HDL is robust and much better than goal of 40+. LDL/HDL is less than 2 - very protective. Liver functions are normal.  Plan Continue present medications.

## 2013-09-22 ENCOUNTER — Other Ambulatory Visit: Payer: Self-pay | Admitting: Internal Medicine

## 2013-09-23 ENCOUNTER — Other Ambulatory Visit: Payer: Self-pay

## 2013-09-23 MED ORDER — ZOLPIDEM TARTRATE 5 MG PO TABS: 5.0000 mg | ORAL_TABLET | Freq: Every evening | ORAL | Status: DC | PRN

## 2013-09-23 MED ORDER — LEVOTHYROXINE SODIUM 75 MCG PO TABS: 75.0000 ug | ORAL_TABLET | Freq: Every day | ORAL | Status: DC

## 2013-10-26 ENCOUNTER — Other Ambulatory Visit: Payer: Self-pay | Admitting: Internal Medicine

## 2013-11-08 DIAGNOSIS — L609 Nail disorder, unspecified: Secondary | ICD-10-CM | POA: Diagnosis not present

## 2013-11-08 DIAGNOSIS — L851 Acquired keratosis [keratoderma] palmaris et plantaris: Secondary | ICD-10-CM | POA: Diagnosis not present

## 2013-12-15 ENCOUNTER — Other Ambulatory Visit: Payer: Self-pay | Admitting: Internal Medicine

## 2013-12-16 ENCOUNTER — Other Ambulatory Visit: Payer: Self-pay | Admitting: Internal Medicine

## 2014-01-09 ENCOUNTER — Other Ambulatory Visit: Payer: Self-pay | Admitting: Gynecology

## 2014-01-11 ENCOUNTER — Other Ambulatory Visit: Payer: Self-pay | Admitting: Internal Medicine

## 2014-01-14 ENCOUNTER — Encounter: Payer: Self-pay | Admitting: Gynecology

## 2014-01-14 ENCOUNTER — Ambulatory Visit (INDEPENDENT_AMBULATORY_CARE_PROVIDER_SITE_OTHER): Payer: Medicare Other | Admitting: Gynecology

## 2014-01-14 ENCOUNTER — Telehealth: Payer: Self-pay | Admitting: *Deleted

## 2014-01-14 ENCOUNTER — Telehealth: Payer: Self-pay | Admitting: Genetic Counselor

## 2014-01-14 VITALS — BP 110/66 | Ht 63.0 in | Wt 110.0 lb

## 2014-01-14 DIAGNOSIS — L290 Pruritus ani: Secondary | ICD-10-CM | POA: Diagnosis not present

## 2014-01-14 DIAGNOSIS — D251 Intramural leiomyoma of uterus: Secondary | ICD-10-CM

## 2014-01-14 DIAGNOSIS — N952 Postmenopausal atrophic vaginitis: Secondary | ICD-10-CM

## 2014-01-14 DIAGNOSIS — M899 Disorder of bone, unspecified: Secondary | ICD-10-CM

## 2014-01-14 DIAGNOSIS — M949 Disorder of cartilage, unspecified: Secondary | ICD-10-CM

## 2014-01-14 DIAGNOSIS — Z803 Family history of malignant neoplasm of breast: Secondary | ICD-10-CM

## 2014-01-14 DIAGNOSIS — M8580 Other specified disorders of bone density and structure, unspecified site: Secondary | ICD-10-CM

## 2014-01-14 MED ORDER — ESTRADIOL 10 MCG VA TABS: ORAL_TABLET | VAGINAL | Status: DC

## 2014-01-14 NOTE — Telephone Encounter (Signed)
S/W PATIENT AND SCHEDULED GENETIC APPT 02/09 @ 2 Shari Prows POWELL REFERRING DR. Sheffield Slider PACKET MAILED.

## 2014-01-14 NOTE — Telephone Encounter (Signed)
Referral faxed to [facility] they will contact pt to schedule.  

## 2014-01-14 NOTE — Telephone Encounter (Signed)
Message copied by Thamas Jaegers on Tue Jan 14, 2014 12:21 PM ------      Message from: Anastasio Auerbach      Created: Tue Jan 14, 2014 12:14 PM       Arrange genetic counseling at the [facility] in reference to strong family history of breast cancer and ovarian cancer. Assess for BRCA testing. Her cancer history is outlined in my last note. ------

## 2014-01-14 NOTE — Progress Notes (Signed)
Dawn Tucker 1946/04/05 [id number]        68 y.o.  G2P2002 for  followup exam.  Several issues or below.  Past medical history,surgical history, problem list, medications, allergies, family history and social history were all reviewed and documented in the EPIC chart.  ROS:  Performed and pertinent positives and negatives are included in the history, assessment and plan .  Exam: Kim assistant Filed Vitals:   01/14/14 1151  BP: 110/66  Height: _0  (1.6 m)  Weight: 110 lb (49.896 kg)   General appearance  Normal Skin grossly normal Head/Neck normal with no cervical or supraclavicular adenopathy thyroid normal Lungs  clear Cardiac RR, without RMG Abdominal  soft, nontender, without masses, organomegaly or hernia Breasts  examined lying and sitting without masses, retractions, discharge or axillary adenopathy. Pelvic  Ext/BUS/vagina  Normal with generalized atrophic changes  Cervix  Normal with atrophic changes  Uterus  axial to anteverted, normal size, shape and contour, midline and mobile nontender   Adnexa  Without masses or tenderness    Anus and perineum  Normal   Rectovaginal  Normal sphincter tone without palpated masses or tenderness.    Assessment/Plan:  68 y.o. [id number] female for annual exam.   1. Postmenopausal/atrophic genital changes. No history of significant hot flushes or night sweats. Does have a history of vaginal dryness with dyspareunia. Using Vagifem and doing well with this. Patient wants to continue. We have discussed the issues of absorption and risks to include possible stroke heart attack DVT breast cancer and uterine stimulation/cancer. Limited absorption with Vagifem reviewed. Patient wants to continue it I refilled her x1 year. 2. Pruritus Ani. Occasionally uses Mytrex with good relief. Has a medication at home and will call when she needs more. 3. 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 Exam shows uterus to be normal size. We'll continue  to monitor with annual exams. 4. Osteopenia. DEXA 03/2013 with T score -2.4 FRAX 11%/2.5%. Had been on Fosamax previously but off for several years for drug-free holiday. Some loss at both hips statistically significant from last DEXA 2012. Will check vitamin D level today. Continue to monitor at present with repeat DEXA next year at two-year interval. Possible treatment if continued loss reviewed. 5. Strong family history of breast cancer. Prior note outlines history as follows "Paternal aunt had breast cancer when she was in her 43s and then developed ovarian cancer when she was in her 70s. She had 3 daughters none of which developed breast ovarian or other cancers. The patient's sister developed breast cancer at age 46. All the family members are of Jewish ancestry. The maternal aunt and her family were not genetically tested but the patient's sister was and is negative for BRCA1 and 2. Dr. Cherylann Banas had talked to the patient previously about genetic testing and offered and the patient has declined to this point. I again reviewed the issues to include if she was gene positive prophylactic mastectomy and BSO options. Even if her sister was negative still has the possibility that she could be positive. Offered testing now versus referral to a Dietitian at [facility] to rediscuss the issue". Patient never followed up for the above. I again reviewed the above and patient now wants referral to genetic counselor. We'll go ahead and make arrangements for this. Even if genetic screening negative but calculated risk 20% or above possible adding MRI to imaging screening was discussed. Mammography 02/2013 negative. Continue with annual mammography. SBE monthly reviewed. 6.  Pap smear 12/2011 negative. No Pap smear done today. History of cryosurgery by Dr. Dorathy Kinsman for low-grade dysplasia. Normal Pap smears since then. Options to stop screaming out together she is over the age of 41 versus less frequent  screening intervals reviewed. Will readdress on an annual basis. 7. Colonoscopy 5-6 years ago with planned repeat interval 10 years. 8. Health maintenance. No routine blood work done as this is all done through her primary physician's office. Followup one year, sooner as needed.   Note: This document was prepared with digital dictation and possible smart phrase technology. Any transcriptional errors that result from this process are unintentional.   Anastasio Auerbach MD, 12:17 PM 01/14/2014

## 2014-01-14 NOTE — Patient Instructions (Signed)
Office will contact you about arranging genetic counseling in reference to breast cancer risk. Followup in one year for annual exam.

## 2014-01-15 LAB — URINALYSIS W MICROSCOPIC + REFLEX CULTURE
Bacteria, UA: NONE SEEN
Bilirubin Urine: NEGATIVE
Casts: NONE SEEN
Crystals: NONE SEEN
GLUCOSE, UA: NEGATIVE mg/dL
HGB URINE DIPSTICK: NEGATIVE
Ketones, ur: NEGATIVE mg/dL
Leukocytes, UA: NEGATIVE
Nitrite: NEGATIVE
PROTEIN: NEGATIVE mg/dL
SQUAMOUS EPITHELIAL / LPF: NONE SEEN
Specific Gravity, Urine: 1.009 (ref 1.005–1.030)
Urobilinogen, UA: 0.2 mg/dL (ref 0.0–1.0)
pH: 6 (ref 5.0–8.0)

## 2014-01-15 LAB — VITAMIN D 25 HYDROXY (VIT D DEFICIENCY, FRACTURES): Vit D, 25-Hydroxy: 48 ng/mL (ref 30–89)

## 2014-01-16 NOTE — Telephone Encounter (Signed)
appt 01/27/14 @ 2:00 pm

## 2014-01-27 ENCOUNTER — Ambulatory Visit (HOSPITAL_BASED_OUTPATIENT_CLINIC_OR_DEPARTMENT_OTHER): Payer: Medicare Other | Admitting: Genetic Counselor

## 2014-01-27 ENCOUNTER — Encounter: Payer: Self-pay | Admitting: Genetic Counselor

## 2014-01-27 ENCOUNTER — Other Ambulatory Visit: Payer: Medicare Other

## 2014-01-27 DIAGNOSIS — Z8041 Family history of malignant neoplasm of ovary: Secondary | ICD-10-CM | POA: Diagnosis not present

## 2014-01-27 DIAGNOSIS — IMO0002 Reserved for concepts with insufficient information to code with codable children: Secondary | ICD-10-CM

## 2014-01-27 DIAGNOSIS — Z803 Family history of malignant neoplasm of breast: Secondary | ICD-10-CM | POA: Diagnosis not present

## 2014-01-27 NOTE — Progress Notes (Signed)
Dr.  Donalynn Furlong requested a consultation for genetic counseling and risk assessment for Dawn Tucker, a 68 y.o. female, for discussion of her family history of breast and ovarian cancer.  She presents to clinic today to discuss the possibility of a genetic predisposition to cancer, and to further clarify her risks, as well as her family members' risks for cancer.   HISTORY OF PRESENT ILLNESS: Dawn Tucker is a 68 y.o. female with no personal history of cancer.  Her sister was tested for the 3 Jewish mutations common in the Bright population and full BRCA testing in 2010 and was negative.  Past Medical History  Diagnosis Date  . CHEST PAIN 03/23/2010  . DEGENERATIVE DISC DISEASE 08/07/2008  . DYSPHAGIA UNSPECIFIED 03/23/2010  . ESOPHAGEAL STRICTURE 01/10/2011  . FIBROIDS, UTERUS 08/07/2008  . GERD 03/23/2010  . HIATAL HERNIA 01/10/2011  . HYPOTHYROIDISM 08/07/2008  . IRRITABLE BOWEL SYNDROME, HX OF 08/07/2008  . Mixed hyperlipidemia 08/07/2008  . OSTEOPENIA 03/2013    T score -2.4 FRAX 11%/2.5%  . PERSONAL HX COLONIC POLYPS 03/23/2010  . Scarlet fever 08/07/2008  . Unspecified pruritic disorder 08/07/2008  . VAGINITIS, ATROPHIC 08/07/2008  . Herpes     Past Surgical History  Procedure Laterality Date  . Rhinoplasty    . Breast surgery      Breast biopsies right & left- benign lesions  . Cesarean section    . Tubal ligation    . Colposcopy    . Colonoscopy    . Gynecologic cryosurgery  by Dr. Dorathy Kinsman    low-grade dysplasia    History   Social History  . Marital Status: Married    Spouse Name: N/A    Number of Children: 2  . Years of Education: N/A   Occupational History  .     Social History Main Topics  . Smoking status: Never Smoker   . Smokeless tobacco: Never Used  . Alcohol Use: Yes     Comment: occassional  . Drug Use: No  . Sexual Activity: Yes    Partners: Male    Birth Control/ Protection: Post-menopausal   Other Topics Concern  . None    Social History Narrative   HSG, [employer]. Married 32'. 1 son- '79, 1 daughter- '71; 2 grandchildren. Marriage is in good health.Very active in community affairs    REPRODUCTIVE HISTORY AND PERSONAL RISK ASSESSMENT FACTORS: Menarche was at age 45.   postmenopausal Uterus Intact: yes Ovaries Intact: yes G2P2A0, first live birth at age 71  She has not previously undergone treatment for infertility.   Oral Contraceptive use: 5 years   She has not used HRT in the past.    FAMILY HISTORY:  We obtained a detailed, 4-generation family history.  Significant diagnoses are listed below: Family History  Problem Relation Age of Onset  . Pneumonia Mother   . Coronary artery disease Father   . Heart disease Father     CAD/MI-fatal  . Breast cancer Paternal Aunt 72  . Ovarian cancer Paternal Aunt     dx in her 40s  . Prostate cancer Maternal Grandfather   . Diabetes Neg Hx   . Colon cancer Neg Hx   . Rectal cancer Neg Hx   . Stomach cancer Neg Hx   . Breast cancer Sister 11    BRCA negative    Patient's maternal ancestors are of [place] descent, and paternal ancestors are of [place] descent. There is reported Ashkenazi Jewish ancestry. There is  no known consanguinity.  GENETIC COUNSELING ASSESSMENT: Dawn Tucker is a 68 y.o. female with a family history of breast and ovarian cancer with [place] which somewhat suggestive of a hereditary breast and ovarian cancer syndrome and predisposition to cancer. We, therefore, discussed and recommended the following at today's visit.   DISCUSSION: We reviewed the characteristics, features and inheritance patterns of hereditary cancer syndromes. We also discussed genetic testing, including the appropriate family members to test, the process of testing, insurance coverage and turn-around-time for results. We discussed the option of testing for 3 jewish mutations, then reflexing to full BRCA testing similar to her sisters testing  pattern.  We also briefly discussed testing for the full 21 gene panel if she were negative for the 3 Jewish mutations.  She currently has Medicare.  Since Medicare does not cover genetic testing in individuals who do not have breast cancer, she would not qualify for coverage.  She could either pay for any and all testing out of pocket, or she could get the 3 Jewish mutations performed through [facility] BRCA research program for free.  She would need to travel to [facility] for this testing.  In order to estimate her chance of having a BRCA mutation, we used statistical models (Penn II and Myriad Risk calculator) and laboratory data that take into account her personal medical history, family history and ancestry.  Because each model is different, there can be a lot of variability in the risks they give.  Therefore, these numbers must be considered a rough range and not a precise risk of having a BRCA mutation.  These models estimate that she has approximately a 23-24% chance of having a mutation. This risk does not take into consideration the negative BRCA test in her sister. Based on this assessment of her family and personal history, genetic testing is recommended.  Using the risk model of Tyrer Cusik, and taking into consideration the genetic test results, her risk for having a BRCA mutation is around 2.5-3%.  Based on the patient's personal and family history, statistical models (Tyrer Cusik)  and literature data were used to estimate her risk of developing breast cancer. These estimate her lifetime risk of developing breast cancr to be approximately 22.9%. This estimation does take into account her sister's genetic testing results.  The patient's lifetime breast cancer risk is a preliminary estimate based on available information using one of several models endorsed by the [place] (ACS). The ACS recommends consideration of breast MRI screening as an adjunct to mammography for patients at high  risk (defined as 20% or greater lifetime risk). A more detailed breast cancer risk assessment can be considered, if clinically indicated.   PLAN: After considering the risks, benefits, and limitations, Dawn Tucker was unsure how to proceed.  She is considering going to [facility], but wants to talk to her husband about possibly paying out of pocket for testing. We discussed that her testing can be performed in the future and that she is not under pressure to do it today.  She will call us in back when she wants to pursue testing here or be referred to [facility]. We encouraged Dawn Tucker to remain in contact with cancer genetics annually so that we can continuously update the family history and inform her of any changes in cancer genetics and testing that may be of benefit for her family. Dawn Tucker's questions were answered to her satisfaction today. Our contact information was provided  should additional questions or concerns arise.  The patient was seen for a total of 60 minutes, greater than 50% of which was spent face-to-face counseling.  This note will also be sent to the referring provider via the electronic medical record. The patient will be supplied with a summary of this genetic counseling discussion as well as educational information on the discussed hereditary cancer syndromes following the conclusion of their visit.   Patient was discussed with Dr. Marcy Panning.   _______________________________________________________________________ For Office Staff:  Number of people involved in session: 1 Was an Intern/ student involved with case: yes

## 2014-02-05 ENCOUNTER — Other Ambulatory Visit: Payer: Self-pay

## 2014-02-05 DIAGNOSIS — Z1231 Encounter for screening mammogram for malignant neoplasm of breast: Secondary | ICD-10-CM

## 2014-03-03 ENCOUNTER — Ambulatory Visit
Admission: RE | Admit: 2014-03-03 | Discharge: 2014-03-03 | Disposition: A | Discharge status: Home / Self Care | Payer: Medicare Other | Source: Ambulatory Visit

## 2014-03-03 DIAGNOSIS — Z1231 Encounter for screening mammogram for malignant neoplasm of breast: Secondary | ICD-10-CM

## 2014-03-11 ENCOUNTER — Other Ambulatory Visit: Payer: Self-pay | Admitting: Internal Medicine

## 2014-04-14 ENCOUNTER — Telehealth: Payer: Self-pay | Admitting: *Deleted

## 2014-04-14 ENCOUNTER — Other Ambulatory Visit: Payer: Self-pay | Admitting: *Deleted

## 2014-04-14 MED ORDER — SIMVASTATIN 20 MG PO TABS: ORAL_TABLET | ORAL | Status: DC

## 2014-04-14 NOTE — Telephone Encounter (Signed)
OK. Thx

## 2014-04-14 NOTE — Telephone Encounter (Signed)
As per pt, her husband, Asanti Craigo, spoke with you and you agreed to take pt on as your pt as well.  Please advise

## 2014-04-15 NOTE — Telephone Encounter (Signed)
Left message on VM, Dr Alain Marion to take as pt.

## 2014-04-17 ENCOUNTER — Ambulatory Visit (INDEPENDENT_AMBULATORY_CARE_PROVIDER_SITE_OTHER): Payer: Medicare Other | Admitting: Gynecology

## 2014-04-17 ENCOUNTER — Encounter: Payer: Self-pay | Admitting: Gynecology

## 2014-04-17 DIAGNOSIS — L293 Anogenital pruritus, unspecified: Secondary | ICD-10-CM | POA: Diagnosis not present

## 2014-04-17 DIAGNOSIS — R350 Frequency of micturition: Secondary | ICD-10-CM

## 2014-04-17 DIAGNOSIS — N390 Urinary tract infection, site not specified: Secondary | ICD-10-CM

## 2014-04-17 DIAGNOSIS — N898 Other specified noninflammatory disorders of vagina: Secondary | ICD-10-CM

## 2014-04-17 LAB — URINALYSIS W MICROSCOPIC + REFLEX CULTURE
Bilirubin Urine: NEGATIVE
Casts: NONE SEEN
Crystals: NONE SEEN
Glucose, UA: NEGATIVE mg/dL
KETONES UR: NEGATIVE mg/dL
NITRITE: NEGATIVE
Protein, ur: NEGATIVE mg/dL
Specific Gravity, Urine: 1.01 (ref 1.005–1.030)
Urobilinogen, UA: 0.2 mg/dL (ref 0.0–1.0)
pH: 6 (ref 5.0–8.0)

## 2014-04-17 LAB — WET PREP FOR TRICH, YEAST, CLUE
Clue Cells Wet Prep HPF POC: NONE SEEN
Trich, Wet Prep: NONE SEEN

## 2014-04-17 MED ORDER — CIPROFLOXACIN HCL 250 MG PO TABS
250.0000 mg | ORAL_TABLET | Freq: Two times a day (BID) | ORAL | Status: DC
Start: 2014-04-17 — End: 2014-08-01

## 2014-04-17 MED ORDER — FLUCONAZOLE 150 MG PO TABS: 150.0000 mg | ORAL_TABLET | Freq: Once | ORAL | Status: DC

## 2014-04-17 NOTE — Patient Instructions (Signed)
Take one Diflucan pill for the vaginal itching. Stop your cholesterol medicine the day you take the pill. Take the ciprofloxacin antibiotic twice daily for 3 days. Keep the remaining 14 pills to take with you on your trip.

## 2014-04-17 NOTE — Progress Notes (Signed)
Dawn Tucker January 11, 1946 [id number]        68 y.o.  G2P2002 presents with 2 day history of vaginal itching and bladder pressure. Also some frequent urination. No fever chills nausea vomiting diarrhea constipation dysuria. No vaginal odor. Slight white discharge noted.  Past medical history,surgical history, problem list, medications, allergies, family history and social history were all reviewed and documented in the EPIC chart.  Directed ROS with pertinent positives and negatives documented in the history of present illness/assessment and plan.  Exam: Kim assistant General appearance  Normal Spine straight without CVA tenderness. Abdomen soft nontender without masses guarding rebound organomegaly. External BUS vagina with white discharge. Generalized atrophic changes noted. Cervix normal. Uterus normal size midline mobile nontender. Adnexa without masses or tenderness.  Assessment/Plan:  68 y.o. G2P2002 : 1. Yeast vaginitis accounting for the itching as evidence by exam and wet prep. Treat with Diflucan 150 mg x1 dose. Second pill provided as she is traveling to [place] to have available in case recurrence. 2. UTI. By history and urinalysis. Will treat with ciprofloxacin 250 mg twice a day x3 days. Additional #14 provided to take with her for her trip in [place] in the event she would have a recurrent UTI or traveler's diarrhea. Followup if symptoms persist, worsen or recur.   Note: This document was prepared with digital dictation and possible smart phrase technology. Any transcriptional errors that result from this process are unintentional.   Anastasio Auerbach MD, 12:49 PM 04/17/2014

## 2014-04-19 LAB — URINE CULTURE

## 2014-05-09 DIAGNOSIS — L608 Other nail disorders: Secondary | ICD-10-CM | POA: Diagnosis not present

## 2014-07-16 DIAGNOSIS — Z808 Family history of malignant neoplasm of other organs or systems: Secondary | ICD-10-CM | POA: Diagnosis not present

## 2014-07-16 DIAGNOSIS — D239 Other benign neoplasm of skin, unspecified: Secondary | ICD-10-CM | POA: Diagnosis not present

## 2014-07-16 DIAGNOSIS — L609 Nail disorder, unspecified: Secondary | ICD-10-CM | POA: Diagnosis not present

## 2014-07-16 DIAGNOSIS — I781 Nevus, non-neoplastic: Secondary | ICD-10-CM | POA: Diagnosis not present

## 2014-07-18 ENCOUNTER — Other Ambulatory Visit: Payer: Self-pay | Admitting: Internal Medicine

## 2014-08-01 ENCOUNTER — Ambulatory Visit (INDEPENDENT_AMBULATORY_CARE_PROVIDER_SITE_OTHER): Payer: Medicare Other | Admitting: Internal Medicine

## 2014-08-01 ENCOUNTER — Telehealth: Payer: Self-pay | Admitting: Internal Medicine

## 2014-08-01 ENCOUNTER — Encounter: Payer: Self-pay | Admitting: Internal Medicine

## 2014-08-01 ENCOUNTER — Other Ambulatory Visit (INDEPENDENT_AMBULATORY_CARE_PROVIDER_SITE_OTHER): Payer: Medicare Other

## 2014-08-01 VITALS — BP 168/92 | HR 68 | Temp 98.3°F | Resp 16 | Wt 112.0 lb

## 2014-08-01 DIAGNOSIS — R945 Abnormal results of liver function studies: Secondary | ICD-10-CM

## 2014-08-01 DIAGNOSIS — E782 Mixed hyperlipidemia: Secondary | ICD-10-CM

## 2014-08-01 DIAGNOSIS — I10 Essential (primary) hypertension: Secondary | ICD-10-CM | POA: Insufficient documentation

## 2014-08-01 DIAGNOSIS — R030 Elevated blood-pressure reading, without diagnosis of hypertension: Secondary | ICD-10-CM | POA: Diagnosis not present

## 2014-08-01 DIAGNOSIS — R1310 Dysphagia, unspecified: Secondary | ICD-10-CM

## 2014-08-01 DIAGNOSIS — R202 Paresthesia of skin: Secondary | ICD-10-CM

## 2014-08-01 DIAGNOSIS — E039 Hypothyroidism, unspecified: Secondary | ICD-10-CM | POA: Diagnosis not present

## 2014-08-01 DIAGNOSIS — IMO0001 Reserved for inherently not codable concepts without codable children: Secondary | ICD-10-CM

## 2014-08-01 DIAGNOSIS — R209 Unspecified disturbances of skin sensation: Secondary | ICD-10-CM

## 2014-08-01 DIAGNOSIS — R7989 Other specified abnormal findings of blood chemistry: Secondary | ICD-10-CM

## 2014-08-01 DIAGNOSIS — R1013 Epigastric pain: Secondary | ICD-10-CM

## 2014-08-01 DIAGNOSIS — K222 Esophageal obstruction: Secondary | ICD-10-CM

## 2014-08-01 LAB — CBC WITH DIFFERENTIAL/PLATELET
BASOS PCT: 0.3 % (ref 0.0–3.0)
Basophils Absolute: 0 10*3/uL (ref 0.0–0.1)
Eosinophils Absolute: 0.1 10*3/uL (ref 0.0–0.7)
Eosinophils Relative: 1.9 % (ref 0.0–5.0)
HCT: 40.6 % (ref 36.0–46.0)
HEMOGLOBIN: 13.7 g/dL (ref 12.0–15.0)
Lymphocytes Relative: 26.2 % (ref 12.0–46.0)
Lymphs Abs: 1.4 10*3/uL (ref 0.7–4.0)
MCHC: 33.8 g/dL (ref 30.0–36.0)
MCV: 87.1 fl (ref 78.0–100.0)
MONO ABS: 0.4 10*3/uL (ref 0.1–1.0)
Monocytes Relative: 7.7 % (ref 3.0–12.0)
NEUTROS ABS: 3.4 10*3/uL (ref 1.4–7.7)
NEUTROS PCT: 63.9 % (ref 43.0–77.0)
Platelets: 242 10*3/uL (ref 150.0–400.0)
RBC: 4.66 Mil/uL (ref 3.87–5.11)
RDW: 13.7 % (ref 11.5–15.5)
WBC: 5.4 10*3/uL (ref 4.0–10.5)

## 2014-08-01 LAB — VITAMIN B12: Vitamin B-12: 966 pg/mL — ABNORMAL HIGH (ref 211–911)

## 2014-08-01 LAB — HEPATIC FUNCTION PANEL
ALT: 48 U/L — AB (ref 0–35)
AST: 39 U/L — ABNORMAL HIGH (ref 0–37)
Albumin: 4.4 g/dL (ref 3.5–5.2)
Alkaline Phosphatase: 83 U/L (ref 39–117)
Bilirubin, Direct: 0.1 mg/dL (ref 0.0–0.3)
TOTAL PROTEIN: 7.8 g/dL (ref 6.0–8.3)
Total Bilirubin: 0.7 mg/dL (ref 0.2–1.2)

## 2014-08-01 LAB — URINALYSIS
Bilirubin Urine: NEGATIVE
Hgb urine dipstick: NEGATIVE
Ketones, ur: NEGATIVE
Leukocytes, UA: NEGATIVE
NITRITE: NEGATIVE
PH: 6 (ref 5.0–8.0)
Total Protein, Urine: NEGATIVE
Urine Glucose: NEGATIVE
Urobilinogen, UA: 0.2 (ref 0.0–1.0)

## 2014-08-01 LAB — BASIC METABOLIC PANEL
BUN: 14 mg/dL (ref 6–23)
CO2: 30 mEq/L (ref 19–32)
Calcium: 9.9 mg/dL (ref 8.4–10.5)
Chloride: 101 mEq/L (ref 96–112)
Creatinine, Ser: 1.1 mg/dL (ref 0.4–1.2)
GFR: 54.83 mL/min — ABNORMAL LOW (ref >60.00)
Glucose, Bld: 100 mg/dL — ABNORMAL HIGH (ref 70–99)
Potassium: 4.8 mEq/L (ref 3.5–5.1)
SODIUM: 137 meq/L (ref 135–145)

## 2014-08-01 LAB — TSH: TSH: 1.52 u[IU]/mL (ref 0.35–4.50)

## 2014-08-01 LAB — SEDIMENTATION RATE: SED RATE: 9 mm/h (ref 0–22)

## 2014-08-01 MED ORDER — LOSARTAN POTASSIUM 50 MG PO TABS: 50.0000 mg | ORAL_TABLET | Freq: Every day | ORAL | Status: DC

## 2014-08-01 NOTE — Assessment & Plan Note (Signed)
Continue with current prescription therapy as reflected on the Med list. Labs  

## 2014-08-01 NOTE — Assessment & Plan Note (Signed)
Continue with current prescription therapy as reflected on the Med list.  

## 2014-08-01 NOTE — Progress Notes (Signed)
Pre visit review using our clinic review tool, if applicable. No additional management support is needed unless otherwise documented below in the visit note. 

## 2014-08-01 NOTE — Progress Notes (Signed)
   Subjective:    HPI  New pt - transfer from Dr Linda Hedges C/o elevated BP x 1 wk, dizziness sometime  BP Readings from Last 3 Encounters:  08/01/14 168/92  01/14/14 110/66  09/16/13 112/78   Wt Readings from Last 3 Encounters:  08/01/14 112 lb (50.803 kg)  01/14/14 110 lb (49.896 kg)  09/16/13 113 lb (51.256 kg)   145/85 re-checked  Review of Systems  Constitutional: Negative for chills, activity change, appetite change, fatigue and unexpected weight change.  HENT: Negative for congestion, mouth sores, nosebleeds and sinus pressure.   Eyes: Negative for visual disturbance.  Respiratory: Negative for cough and chest tightness.   Cardiovascular: Negative for chest pain and leg swelling.  Gastrointestinal: Negative for nausea, abdominal pain and diarrhea.  Genitourinary: Negative for urgency, frequency, difficulty urinating and vaginal pain.  Musculoskeletal: Negative for back pain, gait problem and neck stiffness.  Skin: Negative for pallor and rash.  Neurological: Negative for dizziness, tremors, syncope, weakness, numbness and headaches.  Psychiatric/Behavioral: Negative for confusion and sleep disturbance.       Objective:   Physical Exam  Constitutional: She appears well-developed. No distress.  HENT:  Head: Normocephalic.  Right Ear: External ear normal.  Left Ear: External ear normal.  Nose: Nose normal.  Mouth/Throat: Oropharynx is clear and moist.  Eyes: Conjunctivae are normal. Pupils are equal, round, and reactive to light. Right eye exhibits no discharge. Left eye exhibits no discharge.  Neck: Normal range of motion. Neck supple. No JVD present. No tracheal deviation present. No thyromegaly present.  Cardiovascular: Normal rate, regular rhythm and normal heart sounds.   Pulmonary/Chest: No stridor. No respiratory distress. She has no wheezes.  Abdominal: Soft. Bowel sounds are normal. She exhibits no distension and no mass. There is no tenderness. There is no  rebound and no guarding.  Musculoskeletal: She exhibits no edema and no tenderness.  Lymphadenopathy:    She has no cervical adenopathy.  Neurological: She displays normal reflexes. No cranial nerve deficit. She exhibits normal muscle tone. Coordination normal.  Skin: No rash noted. No erythema.  Psychiatric: She has a normal mood and affect. Her behavior is normal. Judgment and thought content normal.          Assessment & Plan:

## 2014-08-01 NOTE — Assessment & Plan Note (Signed)
Simvastatin 20 mg daily Labs

## 2014-08-01 NOTE — Telephone Encounter (Signed)
Pt called request result of the lab work she got done today. Pt stated she would like to know before the weekend. Please callp

## 2014-08-03 DIAGNOSIS — R7989 Other specified abnormal findings of blood chemistry: Secondary | ICD-10-CM | POA: Insufficient documentation

## 2014-08-03 DIAGNOSIS — R945 Abnormal results of liver function studies: Secondary | ICD-10-CM

## 2014-08-03 NOTE — Assessment & Plan Note (Signed)
Continue with current prescription therapy as reflected on the Med list.  

## 2014-08-03 NOTE — Assessment & Plan Note (Signed)
8/15 - mild Abd [place] Hold statin

## 2014-08-03 NOTE — Assessment & Plan Note (Signed)
8/15 new Labs Start Losartan Abd [place]

## 2014-08-04 NOTE — Telephone Encounter (Signed)
Left detailed mess informing pt of results and MD's advisement on MyChart.

## 2014-08-13 ENCOUNTER — Ambulatory Visit
Admission: RE | Admit: 2014-08-13 | Discharge: 2014-08-13 | Disposition: A | Discharge status: Home / Self Care | Payer: Medicare Other | Source: Ambulatory Visit | Attending: Internal Medicine | Admitting: Internal Medicine

## 2014-08-13 DIAGNOSIS — R7989 Other specified abnormal findings of blood chemistry: Secondary | ICD-10-CM | POA: Diagnosis not present

## 2014-09-10 ENCOUNTER — Telehealth: Payer: Self-pay

## 2014-09-10 MED ORDER — SYNTHROID 75 MCG PO TABS
ORAL_TABLET | ORAL | Status: DC
Start: 2014-09-10 — End: 2014-09-23

## 2014-09-10 NOTE — Telephone Encounter (Signed)
Refill request for synthroid 75 mcg tabs po 1 tab qty 30 w/ 3 rfs done and sent to pharmacy

## 2014-09-16 ENCOUNTER — Encounter: Payer: Self-pay | Admitting: Internal Medicine

## 2014-09-18 ENCOUNTER — Other Ambulatory Visit: Payer: Self-pay | Admitting: *Deleted

## 2014-09-18 DIAGNOSIS — E039 Hypothyroidism, unspecified: Secondary | ICD-10-CM

## 2014-09-18 DIAGNOSIS — R945 Abnormal results of liver function studies: Secondary | ICD-10-CM

## 2014-09-18 DIAGNOSIS — R7989 Other specified abnormal findings of blood chemistry: Secondary | ICD-10-CM

## 2014-09-18 DIAGNOSIS — R030 Elevated blood-pressure reading, without diagnosis of hypertension: Secondary | ICD-10-CM

## 2014-09-18 DIAGNOSIS — Z0000 Encounter for general adult medical examination without abnormal findings: Secondary | ICD-10-CM

## 2014-09-18 DIAGNOSIS — E782 Mixed hyperlipidemia: Secondary | ICD-10-CM

## 2014-09-18 DIAGNOSIS — IMO0001 Reserved for inherently not codable concepts without codable children: Secondary | ICD-10-CM

## 2014-09-23 ENCOUNTER — Other Ambulatory Visit (INDEPENDENT_AMBULATORY_CARE_PROVIDER_SITE_OTHER): Payer: Medicare Other

## 2014-09-23 ENCOUNTER — Encounter: Payer: Self-pay | Admitting: Internal Medicine

## 2014-09-23 ENCOUNTER — Ambulatory Visit (INDEPENDENT_AMBULATORY_CARE_PROVIDER_SITE_OTHER): Payer: Medicare Other | Admitting: Internal Medicine

## 2014-09-23 VITALS — BP 130/80 | HR 66 | Temp 98.4°F | Ht 63.0 in | Wt 112.0 lb

## 2014-09-23 DIAGNOSIS — E782 Mixed hyperlipidemia: Secondary | ICD-10-CM | POA: Diagnosis not present

## 2014-09-23 DIAGNOSIS — R945 Abnormal results of liver function studies: Secondary | ICD-10-CM

## 2014-09-23 DIAGNOSIS — R7989 Other specified abnormal findings of blood chemistry: Secondary | ICD-10-CM

## 2014-09-23 DIAGNOSIS — E039 Hypothyroidism, unspecified: Secondary | ICD-10-CM

## 2014-09-23 DIAGNOSIS — IMO0001 Reserved for inherently not codable concepts without codable children: Secondary | ICD-10-CM

## 2014-09-23 DIAGNOSIS — R030 Elevated blood-pressure reading, without diagnosis of hypertension: Secondary | ICD-10-CM

## 2014-09-23 DIAGNOSIS — Z0000 Encounter for general adult medical examination without abnormal findings: Secondary | ICD-10-CM

## 2014-09-23 DIAGNOSIS — Z23 Encounter for immunization: Secondary | ICD-10-CM

## 2014-09-23 LAB — URINALYSIS, ROUTINE W REFLEX MICROSCOPIC
BILIRUBIN URINE: NEGATIVE
HGB URINE DIPSTICK: NEGATIVE
Ketones, ur: NEGATIVE
Leukocytes, UA: NEGATIVE
Nitrite: NEGATIVE
RBC / HPF: NONE SEEN (ref 0–?)
Specific Gravity, Urine: 1.01 (ref 1.000–1.030)
Total Protein, Urine: NEGATIVE
URINE GLUCOSE: NEGATIVE
UROBILINOGEN UA: 0.2 (ref 0.0–1.0)
WBC UA: NONE SEEN (ref 0–?)
pH: 7 (ref 5.0–8.0)

## 2014-09-23 LAB — CBC WITH DIFFERENTIAL/PLATELET
BASOS ABS: 0 10*3/uL (ref 0.0–0.1)
Basophils Relative: 0.6 % (ref 0.0–3.0)
EOS ABS: 0.2 10*3/uL (ref 0.0–0.7)
Eosinophils Relative: 2.9 % (ref 0.0–5.0)
HCT: 39.5 % (ref 36.0–46.0)
Hemoglobin: 13.2 g/dL (ref 12.0–15.0)
LYMPHS PCT: 34 % (ref 12.0–46.0)
Lymphs Abs: 1.8 10*3/uL (ref 0.7–4.0)
MCHC: 33.5 g/dL (ref 30.0–36.0)
MCV: 88.3 fl (ref 78.0–100.0)
MONOS PCT: 7.6 % (ref 3.0–12.0)
Monocytes Absolute: 0.4 10*3/uL (ref 0.1–1.0)
NEUTROS PCT: 54.9 % (ref 43.0–77.0)
Neutro Abs: 2.9 10*3/uL (ref 1.4–7.7)
PLATELETS: 243 10*3/uL (ref 150.0–400.0)
RBC: 4.47 Mil/uL (ref 3.87–5.11)
RDW: 14.1 % (ref 11.5–15.5)
WBC: 5.3 10*3/uL (ref 4.0–10.5)

## 2014-09-23 MED ORDER — ZOLPIDEM TARTRATE 5 MG PO TABS: ORAL_TABLET | ORAL | Status: DC

## 2014-09-23 MED ORDER — SIMVASTATIN 20 MG PO TABS: ORAL_TABLET | ORAL | Status: DC

## 2014-09-23 MED ORDER — DEXLANSOPRAZOLE 60 MG PO CPDR: DELAYED_RELEASE_CAPSULE | ORAL | Status: DC

## 2014-09-23 MED ORDER — SYNTHROID 75 MCG PO TABS: ORAL_TABLET | ORAL | Status: DC

## 2014-09-23 MED ORDER — LOSARTAN POTASSIUM 50 MG PO TABS: 50.0000 mg | ORAL_TABLET | Freq: Every day | ORAL | Status: DC

## 2014-09-23 NOTE — Progress Notes (Signed)
Pre visit review using our clinic review tool, if applicable. No additional management support is needed unless otherwise documented below in the visit note. 

## 2014-09-23 NOTE — Progress Notes (Signed)
   Subjective:    HPI    F/u elevated BP - nl now; f/u  dizziness sometime - resolved F/u elevated LFTs  BP Readings from Last 3 Encounters:  09/23/14 140/76  08/01/14 168/92  01/14/14 110/66   Wt Readings from Last 3 Encounters:  09/23/14 112 lb (50.803 kg)  08/01/14 112 lb (50.803 kg)  01/14/14 110 lb (49.896 kg)   145/85 re-checked  Review of Systems  Constitutional: Negative for chills, activity change, appetite change, fatigue and unexpected weight change.  HENT: Negative for congestion, mouth sores, nosebleeds and sinus pressure.   Eyes: Negative for visual disturbance.  Respiratory: Negative for cough and chest tightness.   Cardiovascular: Negative for chest pain and leg swelling.  Gastrointestinal: Negative for nausea, abdominal pain and diarrhea.  Genitourinary: Negative for urgency, frequency, difficulty urinating and vaginal pain.  Musculoskeletal: Negative for back pain, gait problem and neck stiffness.  Skin: Negative for pallor and rash.  Neurological: Negative for dizziness, tremors, syncope, weakness, numbness and headaches.  Psychiatric/Behavioral: Negative for confusion and sleep disturbance.       Objective:   Physical Exam  Constitutional: She appears well-developed. No distress.  HENT:  Head: Normocephalic.  Right Ear: External ear normal.  Left Ear: External ear normal.  Nose: Nose normal.  Mouth/Throat: Oropharynx is clear and moist.  Eyes: Conjunctivae are normal. Pupils are equal, round, and reactive to light. Right eye exhibits no discharge. Left eye exhibits no discharge.  Neck: Normal range of motion. Neck supple. No JVD present. No tracheal deviation present. No thyromegaly present.  Cardiovascular: Normal rate, regular rhythm and normal heart sounds.   Pulmonary/Chest: No stridor. No respiratory distress. She has no wheezes.  Abdominal: Soft. Bowel sounds are normal. She exhibits no distension and no mass. There is no tenderness. There  is no rebound and no guarding.  Musculoskeletal: She exhibits no edema and no tenderness.  Lymphadenopathy:    She has no cervical adenopathy.  Neurological: She displays normal reflexes. No cranial nerve deficit. She exhibits normal muscle tone. Coordination normal.  Skin: No rash noted. No erythema.  Psychiatric: She has a normal mood and affect. Her behavior is normal. Judgment and thought content normal.    Lab Results  Component Value Date   WBC 5.4 08/01/2014   HGB 13.7 08/01/2014   HCT 40.6 08/01/2014   PLT 242.0 08/01/2014   GLUCOSE 100* 08/01/2014   CHOL 180 09/16/2013   TRIG 139.0 09/16/2013   HDL 65.20 09/16/2013   LDLCALC 87 09/16/2013   ALT 48* 08/01/2014   AST 39* 08/01/2014   NA 137 08/01/2014   K 4.8 08/01/2014   CL 101 08/01/2014   CREATININE 1.1 08/01/2014   BUN 14 08/01/2014   CO2 30 08/01/2014   TSH 1.52 08/01/2014         Assessment & Plan:

## 2014-09-24 LAB — LIPID PANEL
Cholesterol: 279 mg/dL — ABNORMAL HIGH (ref 0–200)
HDL: 64.2 mg/dL (ref >39.00)
LDL CALC: 192 mg/dL — AB (ref 0–99)
NonHDL: 214.8
Total CHOL/HDL Ratio: 4
Triglycerides: 116 mg/dL (ref 0.0–149.0)
VLDL: 23.2 mg/dL (ref 0.0–40.0)

## 2014-09-24 LAB — BASIC METABOLIC PANEL
BUN: 18 mg/dL (ref 6–23)
CO2: 25 mEq/L (ref 19–32)
CREATININE: 1 mg/dL (ref 0.4–1.2)
Calcium: 9.2 mg/dL (ref 8.4–10.5)
Chloride: 101 mEq/L (ref 96–112)
GFR: 57.29 mL/min — AB (ref >60.00)
Glucose, Bld: 82 mg/dL (ref 70–99)
Potassium: 4.7 mEq/L (ref 3.5–5.1)
SODIUM: 137 meq/L (ref 135–145)

## 2014-09-24 LAB — HEPATIC FUNCTION PANEL
ALT: 24 U/L (ref 0–35)
AST: 29 U/L (ref 0–37)
Albumin: 4.4 g/dL (ref 3.5–5.2)
Alkaline Phosphatase: 66 U/L (ref 39–117)
BILIRUBIN DIRECT: 0.1 mg/dL (ref 0.0–0.3)
Total Bilirubin: 0.8 mg/dL (ref 0.2–1.2)
Total Protein: 7.6 g/dL (ref 6.0–8.3)

## 2014-09-24 LAB — TSH: TSH: 4.89 u[IU]/mL — AB (ref 0.35–4.50)

## 2014-09-25 MED ORDER — ATORVASTATIN CALCIUM 10 MG PO TABS
10.0000 mg | ORAL_TABLET | Freq: Every day | ORAL | Status: DC
Start: 2014-09-25 — End: 2015-09-20

## 2014-09-25 NOTE — Assessment & Plan Note (Signed)
Start Lipitor.

## 2014-09-25 NOTE — Assessment & Plan Note (Signed)
Resolved off Zocor

## 2014-09-25 NOTE — Assessment & Plan Note (Signed)
Cont Losartan

## 2014-09-25 NOTE — Assessment & Plan Note (Signed)
Continue with current prescription therapy as reflected on the Med list. TSH

## 2014-10-12 ENCOUNTER — Other Ambulatory Visit: Payer: Self-pay | Admitting: Internal Medicine

## 2014-10-13 DIAGNOSIS — H2513 Age-related nuclear cataract, bilateral: Secondary | ICD-10-CM | POA: Diagnosis not present

## 2014-10-13 NOTE — Telephone Encounter (Signed)
Pt is requesting simvastatin 20 mg to be refilled. The rx has been dc. Is it ok to refill? Please advise

## 2014-10-20 ENCOUNTER — Encounter: Payer: Self-pay | Admitting: Internal Medicine

## 2014-12-03 ENCOUNTER — Encounter: Payer: Self-pay | Admitting: Women's Health

## 2014-12-03 ENCOUNTER — Telehealth: Payer: Self-pay | Admitting: *Deleted

## 2014-12-03 ENCOUNTER — Ambulatory Visit (INDEPENDENT_AMBULATORY_CARE_PROVIDER_SITE_OTHER): Payer: Medicare Other | Admitting: Women's Health

## 2014-12-03 VITALS — BP 140/78 | Ht 63.0 in | Wt 114.0 lb

## 2014-12-03 DIAGNOSIS — R350 Frequency of micturition: Secondary | ICD-10-CM | POA: Diagnosis not present

## 2014-12-03 LAB — URINALYSIS W MICROSCOPIC + REFLEX CULTURE
Bilirubin Urine: NEGATIVE
Glucose, UA: NEGATIVE mg/dL
HGB URINE DIPSTICK: NEGATIVE
KETONES UR: NEGATIVE mg/dL
Leukocytes, UA: NEGATIVE
Nitrite: NEGATIVE
PH: 6 (ref 5.0–8.0)
Protein, ur: NEGATIVE mg/dL
Specific Gravity, Urine: <1.005 — ABNORMAL LOW (ref 1.005–1.030)
Urobilinogen, UA: 0.2 mg/dL (ref 0.0–1.0)

## 2014-12-03 MED ORDER — PHENAZOPYRIDINE HCL 100 MG PO TABS: 100.0000 mg | ORAL_TABLET | Freq: Three times a day (TID) | ORAL | Status: DC | PRN

## 2014-12-03 MED ORDER — NITROFURANTOIN MACROCRYSTAL 50 MG PO CAPS: ORAL_CAPSULE | ORAL | Status: DC

## 2014-12-03 MED ORDER — CIPROFLOXACIN HCL 250 MG PO TABS: 250.0000 mg | ORAL_TABLET | Freq: Two times a day (BID) | ORAL | Status: DC

## 2014-12-03 NOTE — Patient Instructions (Signed)

## 2014-12-03 NOTE — Telephone Encounter (Signed)
Telephone call, reports rash has dissipated, no difficulty breathing, will stop Pyridium, will try over-the-counter Azo Will await urine culture results

## 2014-12-03 NOTE — Progress Notes (Signed)
Patient ID: Dawn Tucker, female   DOB: 02/02/1946, 68 y.o.   MRN: [id number] Presents with complaint of increased bladder pressure, urinary frequency for 1 day. Denies fever, back pain, pain or burning or pain at end of stream . Had taken 1 Cipro 250 yesterday that was left over from a previous prescription. Infrequent intercourse, symptoms increase with intercourse, which is uncomfortable. Vagifem twice weekly with some relief of vaginal dryness Husband had numerous illnesses/problems last year and has recently become more sexually active. Last UTI April, negative urine culture.  Exam: Appears well. UA: Negative. External genitalia erythematous at introitus, speculum exam atrophic, no visible discharge or erythema. No CVAT.  Atrophic vaginitis  Bladder pressure  Plan: Urine culture pending. Macrodantin 50 mg with intercourse prescription, proper use given. Leaving for [place] in several days, prescription for Cipro 250 twice daily for 3 days given. Will take if culture positive or if symptoms increase while away. Pyridium 100 3 times daily prescription, proper use given will try first. Continue Vagifem, add Replens with intercourse. UTI prevention discussed.

## 2014-12-03 NOTE — Telephone Encounter (Signed)
Pt was seen today prescribed Pyridium 100 3 times daily, went to pharmacy and was given a generic of this only it was 95 mg. Pt only took 1 tablet of medication ealier today and broke out with rash on legs, neck, arms, not on her face. Pt has not taking anymore of medication. She asked if you want to prescribe something else? Please advise

## 2014-12-05 LAB — URINE CULTURE
Colony Count: NO GROWTH
Organism ID, Bacteria: NO GROWTH

## 2014-12-15 ENCOUNTER — Telehealth: Payer: Self-pay | Admitting: Internal Medicine

## 2014-12-15 NOTE — Telephone Encounter (Signed)
Pt has been having problems swallowing and she was scheduled with Dr Henrene Pastor and added to the wait list

## 2014-12-30 ENCOUNTER — Encounter: Payer: Self-pay | Admitting: Internal Medicine

## 2014-12-30 ENCOUNTER — Ambulatory Visit (INDEPENDENT_AMBULATORY_CARE_PROVIDER_SITE_OTHER): Payer: Medicare Other | Admitting: Internal Medicine

## 2014-12-30 VITALS — BP 120/60 | HR 68 | Ht 63.0 in | Wt 112.0 lb

## 2014-12-30 DIAGNOSIS — R1310 Dysphagia, unspecified: Secondary | ICD-10-CM | POA: Insufficient documentation

## 2014-12-30 DIAGNOSIS — K219 Gastro-esophageal reflux disease without esophagitis: Secondary | ICD-10-CM | POA: Diagnosis not present

## 2014-12-30 DIAGNOSIS — K222 Esophageal obstruction: Secondary | ICD-10-CM | POA: Diagnosis not present

## 2014-12-30 NOTE — Progress Notes (Signed)
HISTORY OF PRESENT ILLNESS:  Dawn Tucker is a 69 y.o. female with past medical history as listed below. 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 She last underwent upper endoscopy with 94 French Maloney dilation of the esophagus August 2013. She was continued on Dexilant 60 mg daily. She has had no difficulties with reflux. No swallowing issues until the past year when she describes several intermittent episodes with meats and most recently salmon. The latter that required self-induced emesis for relief. Her interval medical history is stable except for newly diagnosed hypertension which is controlled with losartan. GI review of systems otherwise negative  REVIEW OF SYSTEMS:  All non-GI ROS negative upon extensive review  Past Medical History  Diagnosis Date  . CHEST PAIN 03/23/2010  . DEGENERATIVE DISC DISEASE 08/07/2008  . DYSPHAGIA UNSPECIFIED 03/23/2010  . ESOPHAGEAL STRICTURE 01/10/2011  . FIBROIDS, UTERUS 08/07/2008  . GERD 03/23/2010  . HIATAL HERNIA 01/10/2011  . HYPOTHYROIDISM 08/07/2008  . IRRITABLE BOWEL SYNDROME, HX OF 08/07/2008  . Mixed hyperlipidemia 08/07/2008  . OSTEOPENIA 03/2013    T score -2.4 FRAX 11%/2.5%  . PERSONAL HX COLONIC POLYPS 03/23/2010  . Scarlet fever 08/07/2008  . Unspecified pruritic disorder 08/07/2008  . VAGINITIS, ATROPHIC 08/07/2008  . Herpes   . Dysphagia     Past Surgical History  Procedure Laterality Date  . Rhinoplasty    . Breast surgery      Breast biopsies right & left- benign lesions  . Cesarean section    . Tubal ligation    . Colposcopy    . Colonoscopy    . Gynecologic cryosurgery  by Dr. Dorathy Kinsman    low-grade dysplasia    Social History Claudean Kinds  reports that she has never smoked. She has never used smokeless tobacco. She reports that she drinks alcohol. She reports that she does not use illicit drugs.  family history includes  Breast cancer (age of onset: 95) in her paternal aunt; Breast cancer (age of onset: 43) in her sister; Coronary artery disease in her father; Heart disease (age of onset: 37) in her father; Heart disease (age of onset: 42) in her mother; Hypertension in her sister; Ovarian cancer in her paternal aunt; Pneumonia in her mother; Prostate cancer in her maternal grandfather. There is no history of Diabetes, Colon cancer, Rectal cancer, or Stomach cancer.  Allergies  Allergen Reactions  . Sulfonamide Derivatives Other (See Comments)    REACTION AS A CHILD       PHYSICAL EXAMINATION: Vital signs: BP 120/60 mmHg  Pulse 68  Ht 5\' 3"  (1.6 m)  Wt 112 lb (50.803 kg)  BMI 19.84 kg/m2 General: Well-developed, well-nourished, no acute distress HEENT: Sclerae are anicteric, conjunctiva pink. Oral mucosa intact Lungs: Clear Heart: Regular Abdomen: soft, nontender, nondistended, no obvious ascites, no peritoneal signs, normal bowel sounds. No organomegaly. Extremities: No edema Psychiatric: alert and oriented x3. Cooperative     ASSESSMENT:  #1. Recurrent intermittent solid food dysphagia likely due to recurrent distal esophageal ring. #2. Previous colonoscopy February 2007 with diminutive hyperplastic polyps only. #3. GERD,. symptoms controlled with PPI   PLAN:  #1. Upper endoscopy with esophageal dilation.The nature of the procedure, as well as the risks, benefits, and alternatives were carefully and thoroughly reviewed with the patient. Ample time for discussion and questions allowed. The patient understood, was satisfied, and agreed to proceed. #2. Continue PPI #3. Repeat screening colonoscopy around February  2017         

## 2014-12-30 NOTE — Patient Instructions (Signed)

## 2015-01-13 ENCOUNTER — Other Ambulatory Visit: Payer: Self-pay | Admitting: Internal Medicine

## 2015-01-15 ENCOUNTER — Encounter: Payer: Medicare Other | Admitting: Women's Health

## 2015-01-15 ENCOUNTER — Ambulatory Visit (AMBULATORY_SURGERY_CENTER): Payer: Medicare Other | Admitting: Internal Medicine

## 2015-01-15 ENCOUNTER — Encounter: Payer: Self-pay | Admitting: Internal Medicine

## 2015-01-15 VITALS — BP 98/62 | HR 59 | Temp 98.2°F | Resp 16 | Ht 63.0 in | Wt 112.0 lb

## 2015-01-15 DIAGNOSIS — R1310 Dysphagia, unspecified: Secondary | ICD-10-CM | POA: Diagnosis not present

## 2015-01-15 DIAGNOSIS — K219 Gastro-esophageal reflux disease without esophagitis: Secondary | ICD-10-CM

## 2015-01-15 DIAGNOSIS — K222 Esophageal obstruction: Secondary | ICD-10-CM

## 2015-01-15 MED ORDER — SODIUM CHLORIDE 0.9 % IV SOLN: 500.0000 mL | INTRAVENOUS | Status: DC

## 2015-01-15 NOTE — Progress Notes (Signed)
Called to room to assist during endoscopic procedure.  Patient ID and intended procedure confirmed with present staff. Received instructions for my participation in the procedure from the performing physician.  

## 2015-01-15 NOTE — Patient Instructions (Signed)
Discharge instructions given. Handout on a dilatation diet. Resume previous medications. Your may notice some irritation in your nose or some drainage.  This may cause feelings of congestion.  This is from the oxygen, which can be very drying.  There is no need for concern, this should clear up in a day or so YOU HAD AN ENDOSCOPIC PROCEDURE TODAY AT [facility]: Refer to the procedure report that was given to you for any specific questions about what was found during the examination.  If the procedure report does not answer your questions, please call your gastroenterologist to clarify.  If you requested that your care partner not be given the details of your procedure findings, then the procedure report has been included in a sealed envelope for you to review at your convenience later.  YOU SHOULD EXPECT: Some feelings of bloating in the abdomen. Passage of more gas than usual.  Walking can help get rid of the air that was put into your GI tract during the procedure and reduce the bloating. If you had a lower endoscopy (such as a colonoscopy or flexible sigmoidoscopy) you may notice spotting of blood in your stool or on the toilet paper. If you underwent a bowel prep for your procedure, then you may not have a normal bowel movement for a few days.  DIET: Your first meal following the procedure should be a light meal and then it is ok to progress to your normal diet.  A half-sandwich or bowl of soup is an example of a good first meal.  Heavy or fried foods are harder to digest and may make you feel nauseous or bloated.  Likewise meals heavy in dairy and vegetables can cause extra gas to form and this can also increase the bloating.  Drink plenty of fluids but you should avoid alcoholic beverages for 24 hours.  ACTIVITY: Your care partner should take you home directly after the procedure.  You should plan to take it easy, moving slowly for the rest of the day.  You can resume normal  activity the day after the procedure however you should NOT DRIVE or use heavy machinery for 24 hours (because of the sedation medicines used during the test).    SYMPTOMS TO REPORT IMMEDIATELY: A gastroenterologist can be reached at any hour.  During normal business hours, 8:30 AM to 5:00 PM Monday through Friday, call [phone].  After hours and on weekends, please call the GI answering service at [phone] who will take a message and have the physician on call contact you.    Following upper endoscopy (EGD)  Vomiting of blood or coffee ground material  New chest pain or pain under the shoulder blades  Painful or persistently difficult swallowing  New shortness of breath  Fever of 100F or higher  Black, tarry-looking stools  FOLLOW UP: If any biopsies were taken you will be contacted by phone or by letter within the next 1-3 weeks.  Call your gastroenterologist if you have not heard about the biopsies in 3 weeks.  Our staff will call the home number listed on your records the next business day following your procedure to check on you and address any questions or concerns that you may have at that time regarding the information given to you following your procedure. This is a courtesy call and so if there is no answer at the home number and we have not heard from you through the emergency physician on call, we will  assume that you have returned to your regular daily activities without incident.  SIGNATURES/CONFIDENTIALITY: You and/or your care partner have signed paperwork which will be entered into your electronic medical record.  These signatures attest to the fact that that the information above on your After Visit Summary has been reviewed and is understood.  Full responsibility of the confidentiality of this discharge information lies with you and/or your care-partner.

## 2015-01-15 NOTE — Progress Notes (Signed)
Patient awakening,vss,report to rn 

## 2015-01-15 NOTE — Op Note (Signed)
[facility]  [address] [place], [address]   ENDOSCOPY PROCEDURE REPORT  PATIENT: Dawn Tucker, Dawn Tucker  MR#: [id number] BIRTHDATE: [birth year] , 55  yrs. old GENDER: female ENDOSCOPIST: Eustace Quail, MD REFERRED BY:  .  Self / Office PROCEDURE DATE:  01/15/2015 PROCEDURE:  EGD, diagnostic and Maloney dilation of esophagus  - 12 F ASA CLASS:     Class II INDICATIONS:  dysphagia and therapeutic procedure. . Previous esophageal dilation August 2013 to 58 [place] MEDICATIONS: Monitored anesthesia care and Propofol 200 mg IV TOPICAL ANESTHETIC: none  DESCRIPTION OF PROCEDURE: After the risks benefits and alternatives of the procedure were thoroughly explained, informed consent was obtained.  The LB [id number] endoscope was introduced through the mouth and advanced to the second portion of the duodenum , Without limitations.  The instrument was slowly withdrawn as the mucosa was fully examined.   EXAM:The esophagus revealed distal esophageal ring.  Mild inflammation.  The stomach revealed benign fundic gland type polyps.  Otherwise normal.  The duodenum was normal.  Retroflexed views revealed a hiatal hernia.     The scope was then withdrawn from the patient and the procedure completed.  THERAPY: 60 French Maloney dilator was passed to esophagus without resistance. Relook endoscopy revealed no trauma.  COMPLICATIONS: There were no immediate complications.  ENDOSCOPIC IMPRESSION: 1. GERD 2. Distal esophageal ring/stricture status post dilation?"60 [place]   RECOMMENDATIONS: 1.  Clear liquids until 12 noon, then soft foods until 4 PM.  Resume prior diet thereafter. 2.  Continue PPI    (Dexilant)  REPEAT EXAM:  eSigned:  Eustace Quail, MD 01/15/2015 9:25 AM    XT:GGYI V.  Plotnikov, MD and The Patient

## 2015-01-16 ENCOUNTER — Telehealth: Payer: Self-pay | Admitting: *Deleted

## 2015-01-16 NOTE — Telephone Encounter (Signed)
  Follow up Call-  Call back number 01/15/2015 07/20/2012  Post procedure Call Back phone  # [phone]  Permission to leave phone message Yes Yes     Patient questions:  Do you have a fever, pain , or abdominal swelling? No. Pain Score  0 *  Have you tolerated food without any problems? Yes.    Have you been able to return to your normal activities? Yes.    Do you have any questions about your discharge instructions: Diet   No. Medications  No. Follow up visit  No.  Do you have questions or concerns about your Care? No.  Actions: * If pain score is 4 or above: No action needed, pain <4.

## 2015-01-20 ENCOUNTER — Ambulatory Visit (INDEPENDENT_AMBULATORY_CARE_PROVIDER_SITE_OTHER): Payer: Medicare Other | Admitting: Women's Health

## 2015-01-20 ENCOUNTER — Encounter: Payer: Self-pay | Admitting: Women's Health

## 2015-01-20 VITALS — BP 124/80 | Ht 63.0 in | Wt 111.0 lb

## 2015-01-20 DIAGNOSIS — N952 Postmenopausal atrophic vaginitis: Secondary | ICD-10-CM

## 2015-01-20 DIAGNOSIS — M810 Age-related osteoporosis without current pathological fracture: Secondary | ICD-10-CM | POA: Diagnosis not present

## 2015-01-20 DIAGNOSIS — Z01419 Encounter for gynecological examination (general) (routine) without abnormal findings: Secondary | ICD-10-CM

## 2015-01-20 DIAGNOSIS — Z13820 Encounter for screening for osteoporosis: Secondary | ICD-10-CM

## 2015-01-20 MED ORDER — ESTRADIOL 10 MCG VA TABS: ORAL_TABLET | VAGINAL | Status: DC

## 2015-01-20 NOTE — Patient Instructions (Signed)

## 2015-01-20 NOTE — Progress Notes (Signed)
Dawn Tucker 02-Apr-1946 [id number]    History:    Presents for annual exam.  Postmenopausal/no bleeding/no HRT. 03/2013 T score -2.4 FRAX 11%/2.5% has used Fosamax in the past. Vitamin D 48. Active healthy lifestyle. Normal mammogram history, history of a negative biopsy left breast many years ago. Cryo-in her 80s with normal Paps after. Sister with breast cancer BRCA negative. Paternal aunt breast cancer and ovarian cancer. Had talked to a geneticist not covered under guidelines. Hypothyroid/hypertension/hypercholesterolemia-primary care manages. Negative colonoscopy 2008, esophageal stretching 12/2014 has had done in the past also. Current on vaccines.  Past medical history, past surgical history, family history and social history were all reviewed and documented in the EPIC chart. Numerous family members with heart disease. Son and daughter both live in [place]. 3 grandchildren.  ROS:  A ROS was performed and pertinent positives and negatives are included.  Exam:  Filed Vitals:   01/20/15 1124  BP: 124/80    General appearance:  Normal Thyroid:  Symmetrical, normal in size, without palpable masses or nodularity. Respiratory  Auscultation:  Clear without wheezing or rhonchi Cardiovascular  Auscultation:  Regular rate, without rubs, murmurs or gallops  Edema/varicosities:  Not grossly evident Abdominal  Soft,nontender, without masses, guarding or rebound.  Liver/spleen:  No organomegaly noted  Hernia:  None appreciated  Skin  Inspection:  Grossly normal   Breasts: Examined lying and sitting.     Right: Without masses, retractions, discharge or axillary adenopathy.     Left: Without masses, retractions, discharge or axillary adenopathy. Gentitourinary   Inguinal/mons:  Normal without inguinal adenopathy  External genitalia:  Normal  BUS/Urethra/Skene's glands:  Normal  Vagina:  Normal  Cervix:  Normal  Uterus:   normal in size, shape and contour.  Midline and  mobile  Adnexa/parametria:     Rt: Without masses or tenderness.   Lt: Without masses or tenderness.  Anus and perineum: Normal  Digital rectal exam: Normal sphincter tone without palpated masses or tenderness  Assessment/Plan:  69 y.o. MWF G2 P2 for annual exam with no complaints.  Osteoporosis Fosamax in the past Postmenopausal/no bleeding /Vagifem Hypothyroid/hypertension/hypercholesterolemia-primary care manages labs and meds  Plan: Repeat DEXA in April, continue healthy active lifestyle with regular exercise, calcium rich diet, vitamin D 1000 daily encouraged. Home safety fall prevention and importance of regular exercise reviewed. SBE's, continue annual screening mammogram 3-D tomography reviewed and encouraged, history of dense breasts. Genetic testing reviewed has decided against. UA, no Pap new screening guidelines reviewed. Vagifem prescription, proper use, uses one to 2 times weekly reviewed minimal systemic absorption, reviewed slight risk for clots, strokes. Continue vaginal lubricants with intercourse.  [place], 12:12 PM 01/20/2015

## 2015-01-21 LAB — URINALYSIS W MICROSCOPIC + REFLEX CULTURE
BACTERIA UA: NONE SEEN
Bilirubin Urine: NEGATIVE
CRYSTALS: NONE SEEN
Casts: NONE SEEN
Glucose, UA: NEGATIVE mg/dL
Hgb urine dipstick: NEGATIVE
Ketones, ur: NEGATIVE mg/dL
Leukocytes, UA: NEGATIVE
NITRITE: NEGATIVE
PROTEIN: NEGATIVE mg/dL
Specific Gravity, Urine: 1.005 (ref 1.005–1.030)
Squamous Epithelial / LPF: NONE SEEN
UROBILINOGEN UA: 0.2 mg/dL (ref 0.0–1.0)
pH: 7.5 (ref 5.0–8.0)

## 2015-01-31 ENCOUNTER — Other Ambulatory Visit: Payer: Self-pay | Admitting: Gynecology

## 2015-02-05 ENCOUNTER — Other Ambulatory Visit: Payer: Self-pay

## 2015-02-05 DIAGNOSIS — Z1231 Encounter for screening mammogram for malignant neoplasm of breast: Secondary | ICD-10-CM

## 2015-02-06 ENCOUNTER — Other Ambulatory Visit (INDEPENDENT_AMBULATORY_CARE_PROVIDER_SITE_OTHER): Payer: Medicare Other

## 2015-02-06 DIAGNOSIS — E039 Hypothyroidism, unspecified: Secondary | ICD-10-CM

## 2015-02-06 DIAGNOSIS — R7989 Other specified abnormal findings of blood chemistry: Secondary | ICD-10-CM | POA: Diagnosis not present

## 2015-02-06 DIAGNOSIS — E782 Mixed hyperlipidemia: Secondary | ICD-10-CM | POA: Diagnosis not present

## 2015-02-06 DIAGNOSIS — IMO0001 Reserved for inherently not codable concepts without codable children: Secondary | ICD-10-CM

## 2015-02-06 DIAGNOSIS — R030 Elevated blood-pressure reading, without diagnosis of hypertension: Secondary | ICD-10-CM

## 2015-02-06 DIAGNOSIS — R945 Abnormal results of liver function studies: Secondary | ICD-10-CM

## 2015-02-06 LAB — URINALYSIS
Bilirubin Urine: NEGATIVE
Ketones, ur: NEGATIVE
Leukocytes, UA: NEGATIVE
NITRITE: NEGATIVE
Specific Gravity, Urine: 1.01 (ref 1.000–1.030)
Total Protein, Urine: NEGATIVE
Urine Glucose: NEGATIVE
Urobilinogen, UA: 0.2 (ref 0.0–1.0)
pH: 6 (ref 5.0–8.0)

## 2015-02-06 LAB — BASIC METABOLIC PANEL
BUN: 15 mg/dL (ref 6–23)
CALCIUM: 9.3 mg/dL (ref 8.4–10.5)
CO2: 28 mEq/L (ref 19–32)
CREATININE: 0.97 mg/dL (ref 0.40–1.20)
Chloride: 103 mEq/L (ref 96–112)
GFR: 60.65 mL/min (ref >60.00)
Glucose, Bld: 93 mg/dL (ref 70–99)
Potassium: 4.5 mEq/L (ref 3.5–5.1)
Sodium: 139 mEq/L (ref 135–145)

## 2015-02-06 LAB — LIPID PANEL
Cholesterol: 156 mg/dL (ref 0–200)
HDL: 55.9 mg/dL (ref >39.00)
LDL Cholesterol: 83 mg/dL (ref 0–99)
NonHDL: 100.1
TRIGLYCERIDES: 85 mg/dL (ref 0.0–149.0)
Total CHOL/HDL Ratio: 3
VLDL: 17 mg/dL (ref 0.0–40.0)

## 2015-02-06 LAB — T4, FREE: FREE T4: 0.89 ng/dL (ref 0.60–1.60)

## 2015-02-06 LAB — HEPATIC FUNCTION PANEL
ALK PHOS: 77 U/L (ref 39–117)
ALT: 17 U/L (ref 0–35)
AST: 19 U/L (ref 0–37)
Albumin: 3.9 g/dL (ref 3.5–5.2)
Bilirubin, Direct: 0.1 mg/dL (ref 0.0–0.3)
Total Bilirubin: 0.4 mg/dL (ref 0.2–1.2)
Total Protein: 7.5 g/dL (ref 6.0–8.3)

## 2015-02-06 LAB — TSH: TSH: 1.64 u[IU]/mL (ref 0.35–4.50)

## 2015-02-09 ENCOUNTER — Encounter: Payer: Self-pay | Admitting: Internal Medicine

## 2015-02-09 ENCOUNTER — Ambulatory Visit (INDEPENDENT_AMBULATORY_CARE_PROVIDER_SITE_OTHER): Payer: Medicare Other | Admitting: Internal Medicine

## 2015-02-09 VITALS — BP 120/72 | HR 67 | Temp 98.2°F | Ht 63.0 in | Wt 113.5 lb

## 2015-02-09 DIAGNOSIS — E782 Mixed hyperlipidemia: Secondary | ICD-10-CM | POA: Diagnosis not present

## 2015-02-09 DIAGNOSIS — R945 Abnormal results of liver function studies: Principal | ICD-10-CM

## 2015-02-09 DIAGNOSIS — R7989 Other specified abnormal findings of blood chemistry: Secondary | ICD-10-CM

## 2015-02-09 DIAGNOSIS — M2550 Pain in unspecified joint: Secondary | ICD-10-CM

## 2015-02-09 DIAGNOSIS — I10 Essential (primary) hypertension: Secondary | ICD-10-CM

## 2015-02-09 NOTE — Assessment & Plan Note (Signed)
Controlled.  

## 2015-02-09 NOTE — Assessment & Plan Note (Addendum)
Lipitor qod due to arthralgias (mild) Labs in 3 mo

## 2015-02-09 NOTE — Progress Notes (Signed)
   Subjective:    HPI    F/u elevated BP - nl now; f/u  dizziness sometime - resolved F/u elevated LFTs F/u dyslipidemia - muscles hurt a little  BP Readings from Last 3 Encounters:  02/09/15 120/72  01/20/15 124/80  01/15/15 98/62   Wt Readings from Last 3 Encounters:  02/09/15 113 lb 8 oz (51.483 kg)  01/20/15 111 lb (50.349 kg)  01/15/15 112 lb (50.803 kg)   145/85 re-checked  Review of Systems  Constitutional: Negative for chills, activity change, appetite change, fatigue and unexpected weight change.  HENT: Negative for congestion, mouth sores, nosebleeds and sinus pressure.   Eyes: Negative for visual disturbance.  Respiratory: Negative for cough and chest tightness.   Cardiovascular: Negative for chest pain and leg swelling.  Gastrointestinal: Negative for nausea, abdominal pain and diarrhea.  Genitourinary: Negative for urgency, frequency, difficulty urinating and vaginal pain.  Musculoskeletal: Negative for back pain, gait problem and neck stiffness.  Skin: Negative for pallor and rash.  Neurological: Negative for dizziness, tremors, syncope, weakness, numbness and headaches.  Psychiatric/Behavioral: Negative for confusion and sleep disturbance.       Objective:   Physical Exam  Constitutional: She appears well-developed. No distress.  HENT:  Head: Normocephalic.  Right Ear: External ear normal.  Left Ear: External ear normal.  Nose: Nose normal.  Mouth/Throat: Oropharynx is clear and moist.  Eyes: Conjunctivae are normal. Pupils are equal, round, and reactive to light. Right eye exhibits no discharge. Left eye exhibits no discharge.  Neck: Normal range of motion. Neck supple. No JVD present. No tracheal deviation present. No thyromegaly present.  Cardiovascular: Normal rate, regular rhythm and normal heart sounds.   Pulmonary/Chest: No stridor. No respiratory distress. She has no wheezes.  Abdominal: Soft. Bowel sounds are normal. She exhibits no  distension and no mass. There is no tenderness. There is no rebound and no guarding.  Musculoskeletal: She exhibits no edema or tenderness.  Lymphadenopathy:    She has no cervical adenopathy.  Neurological: She displays normal reflexes. No cranial nerve deficit. She exhibits normal muscle tone. Coordination normal.  Skin: No rash noted. No erythema.  Psychiatric: She has a normal mood and affect. Her behavior is normal. Judgment and thought content normal.    Lab Results  Component Value Date   WBC 5.3 09/23/2014   HGB 13.2 09/23/2014   HCT 39.5 09/23/2014   PLT 243.0 09/23/2014   GLUCOSE 93 02/06/2015   CHOL 156 02/06/2015   TRIG 85.0 02/06/2015   HDL 55.90 02/06/2015   LDLCALC 83 02/06/2015   ALT 17 02/06/2015   AST 19 02/06/2015   NA 139 02/06/2015   K 4.5 02/06/2015   CL 103 02/06/2015   CREATININE 0.97 02/06/2015   BUN 15 02/06/2015   CO2 28 02/06/2015   TSH 1.64 02/06/2015         Assessment & Plan:  Patient ID: Dawn Tucker, female   DOB: February 14, 1946, 69 y.o.   MRN: [id number]

## 2015-02-09 NOTE — Assessment & Plan Note (Signed)
On Lipitor - nl LFTs

## 2015-02-09 NOTE — Progress Notes (Signed)
Pre visit review using our clinic review tool, if applicable. No additional management support is needed unless otherwise documented below in the visit note. 

## 2015-02-10 ENCOUNTER — Telehealth: Payer: Self-pay | Admitting: Internal Medicine

## 2015-02-10 NOTE — Telephone Encounter (Signed)
emmi emailed °

## 2015-03-05 ENCOUNTER — Ambulatory Visit: Payer: Medicare Other

## 2015-03-05 ENCOUNTER — Other Ambulatory Visit: Payer: Self-pay | Admitting: Gynecology

## 2015-03-05 DIAGNOSIS — Z13820 Encounter for screening for osteoporosis: Secondary | ICD-10-CM

## 2015-03-05 DIAGNOSIS — M810 Age-related osteoporosis without current pathological fracture: Secondary | ICD-10-CM

## 2015-03-16 ENCOUNTER — Other Ambulatory Visit: Payer: Self-pay | Admitting: Dermatology

## 2015-03-16 DIAGNOSIS — L03019 Cellulitis of unspecified finger: Secondary | ICD-10-CM | POA: Diagnosis not present

## 2015-03-16 DIAGNOSIS — L601 Onycholysis: Secondary | ICD-10-CM | POA: Diagnosis not present

## 2015-03-16 DIAGNOSIS — L821 Other seborrheic keratosis: Secondary | ICD-10-CM | POA: Diagnosis not present

## 2015-03-18 ENCOUNTER — Ambulatory Visit
Admission: RE | Admit: 2015-03-18 | Discharge: 2015-03-18 | Disposition: A | Discharge status: Home / Self Care | Payer: Medicare Other | Source: Ambulatory Visit

## 2015-03-18 DIAGNOSIS — L601 Onycholysis: Secondary | ICD-10-CM | POA: Diagnosis not present

## 2015-03-18 DIAGNOSIS — Z1231 Encounter for screening mammogram for malignant neoplasm of breast: Secondary | ICD-10-CM | POA: Diagnosis not present

## 2015-03-20 DIAGNOSIS — M810 Age-related osteoporosis without current pathological fracture: Secondary | ICD-10-CM

## 2015-03-20 HISTORY — DX: Age-related osteoporosis without current pathological fracture: M81.0

## 2015-03-31 ENCOUNTER — Telehealth: Payer: Self-pay | Admitting: Gynecology

## 2015-03-31 ENCOUNTER — Ambulatory Visit (INDEPENDENT_AMBULATORY_CARE_PROVIDER_SITE_OTHER): Payer: Medicare Other

## 2015-03-31 ENCOUNTER — Other Ambulatory Visit: Payer: Self-pay | Admitting: Gynecology

## 2015-03-31 ENCOUNTER — Encounter: Payer: Self-pay | Admitting: Gynecology

## 2015-03-31 DIAGNOSIS — M810 Age-related osteoporosis without current pathological fracture: Secondary | ICD-10-CM | POA: Diagnosis not present

## 2015-03-31 DIAGNOSIS — Z13820 Encounter for screening for osteoporosis: Secondary | ICD-10-CM

## 2015-03-31 NOTE — Telephone Encounter (Signed)
Tell patient her bone density officially shows osteoporosis but did not show statistically significant change from prior DEXA. I think given her total picture I would recommend following for now and repeating it in 2 years. If it shows trending towards loss then we may discuss reinitiation of Fosamax or other treatments.

## 2015-03-31 NOTE — Telephone Encounter (Signed)
Left message for pt to call.

## 2015-04-01 NOTE — Telephone Encounter (Signed)
Pt informed with the below note. 

## 2015-04-06 ENCOUNTER — Other Ambulatory Visit: Payer: Self-pay | Admitting: Internal Medicine

## 2015-04-06 NOTE — Telephone Encounter (Signed)
Ok to refill 

## 2015-04-07 NOTE — Telephone Encounter (Signed)
Refilled  Thx

## 2015-04-07 NOTE — Telephone Encounter (Signed)
Patient is going out of town tomorrow and is requesting this to be sent today.

## 2015-04-08 ENCOUNTER — Telehealth: Payer: Self-pay

## 2015-04-08 MED ORDER — ZOLPIDEM TARTRATE 5 MG PO TABS: 5.0000 mg | ORAL_TABLET | Freq: Every evening | ORAL | Status: DC | PRN

## 2015-04-08 NOTE — Telephone Encounter (Signed)
rx approved, printed, signed and faxed over to patients pharmacy, i called and left voice mail advising patient

## 2015-04-30 ENCOUNTER — Ambulatory Visit (INDEPENDENT_AMBULATORY_CARE_PROVIDER_SITE_OTHER): Payer: Medicare Other | Admitting: Gynecology

## 2015-04-30 ENCOUNTER — Encounter: Payer: Self-pay | Admitting: Gynecology

## 2015-04-30 VITALS — BP 120/76

## 2015-04-30 DIAGNOSIS — M810 Age-related osteoporosis without current pathological fracture: Secondary | ICD-10-CM

## 2015-04-30 NOTE — Patient Instructions (Signed)
Follow up routinely when you're due for your annual exam. 

## 2015-04-30 NOTE — Progress Notes (Signed)
Dawn Tucker 19-Mar-1946 [id number]        69 y.o.  G2P2002 presents to discuss her most recent bone density 03/31/2015. T score -2.6 right femoral neck. Stable and all measured sites from prior DEXA 2014. Patient has a history of approximately 5 years of Fosamax discontinued roughly 5-7 years ago. Is active and exercises/walks on a regular basis.  Past medical history,surgical history, problem list, medications, allergies, family history and social history were all reviewed and documented in the EPIC chart.  Directed ROS with pertinent positives and negatives documented in the history of present illness/assessment and plan.  Exam: Filed Vitals:   04/30/15 1523  BP: 120/76    Assessment/Plan:  69 y.o. [id number] with osteoporosis stable from prior DEXA. Prior use of Fosamax now on drug-free holiday. Historically had a secondary workup years ago by Dr. Cherylann Banas to include 24 urine collection and blood work. I reviewed with her the issues of drug-free holiday and how long beneficial effects last. Whether she is at a higher fracture risk at a -2.6 having had Fosamax in the past versus a patient with a -2.6 never having been treated. The issues of age contribution to fracture risk regardless of T score also reviewed. Various treatment options discussed include bisphosphonates Prolia Evista Forteo. Consultation with Dr. Rubie Maid also discussed. Ultimately we both agree to hold on medication continue with her lifestyle and recheck a bone density in 2 years.    Anastasio Auerbach MD, 4:03 PM 04/30/2015

## 2015-05-04 IMAGING — US US ABDOMEN COMPLETE
1 series · 14 of 25 positions shown · non-contrast
Comparison: 02/10/2007

CLINICAL DATA: Elevated LFTs

EXAM:
ULTRASOUND ABDOMEN COMPLETE

[Series 1: us abdomen complete · 0.30mm/px · 14 of 73 slices shown]
[im 1/73]
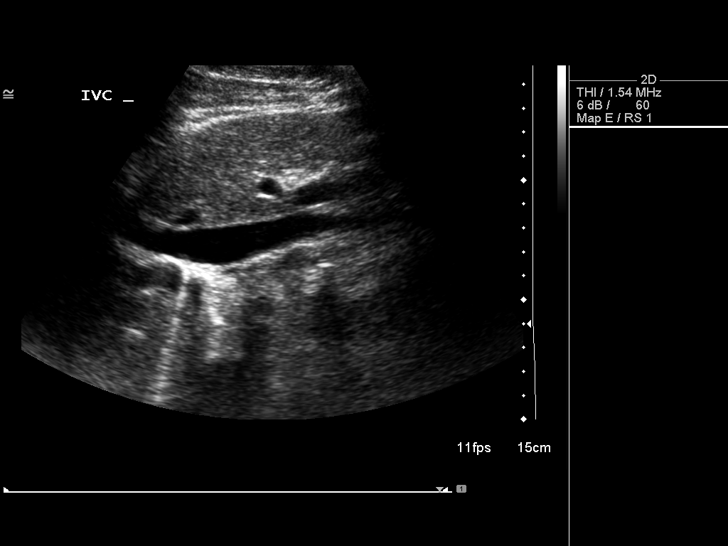
[im 7/73]
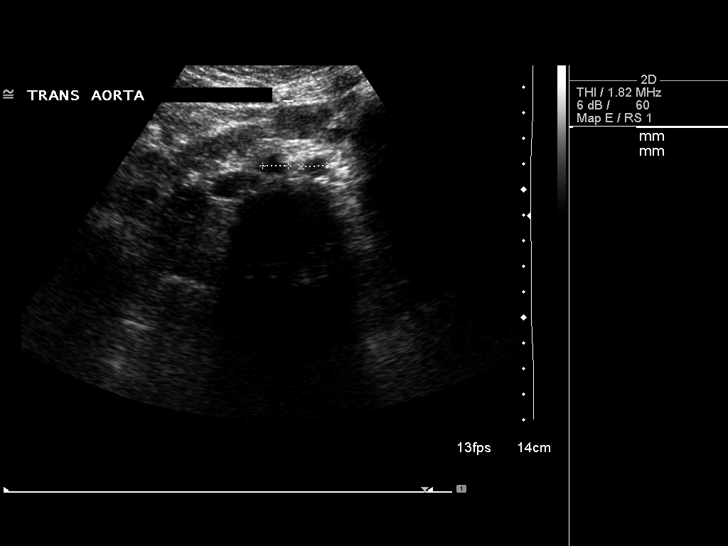
[im 13/73]
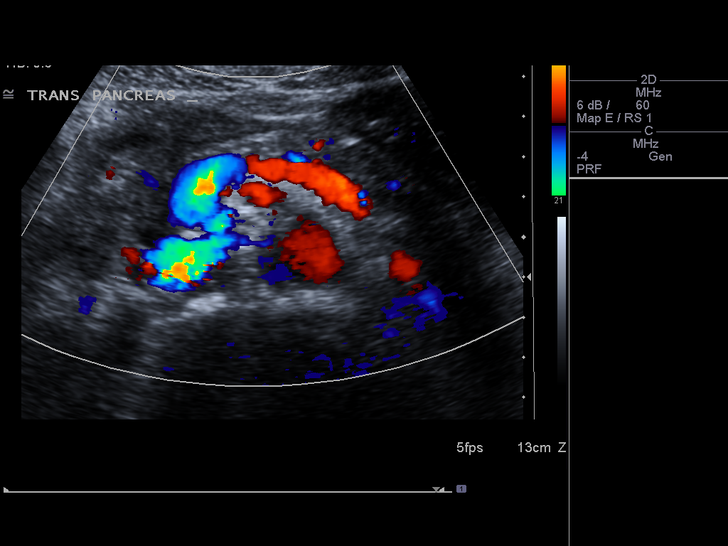
[im 19/73]
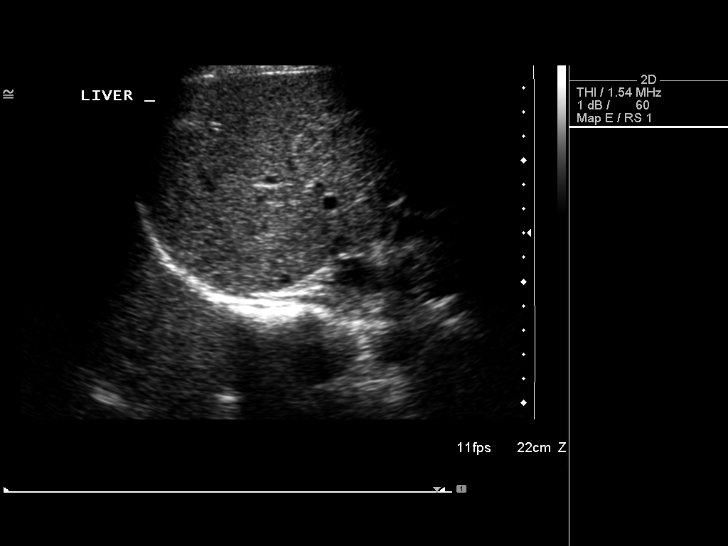
[im 25/73]
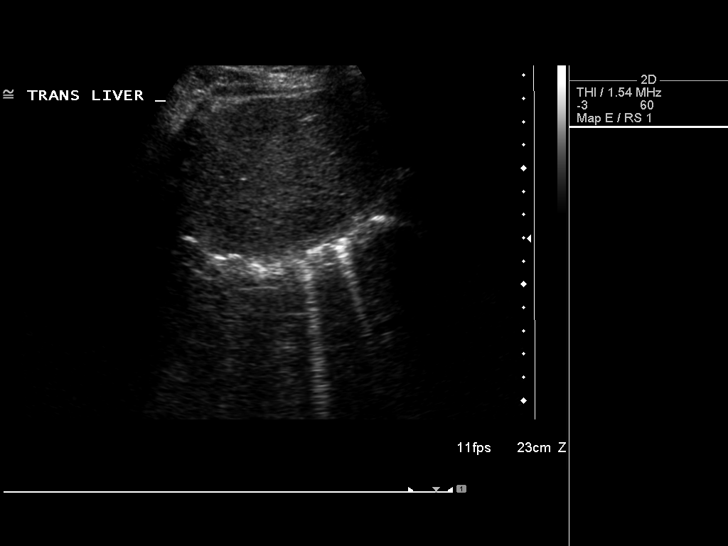
[im 28/73]
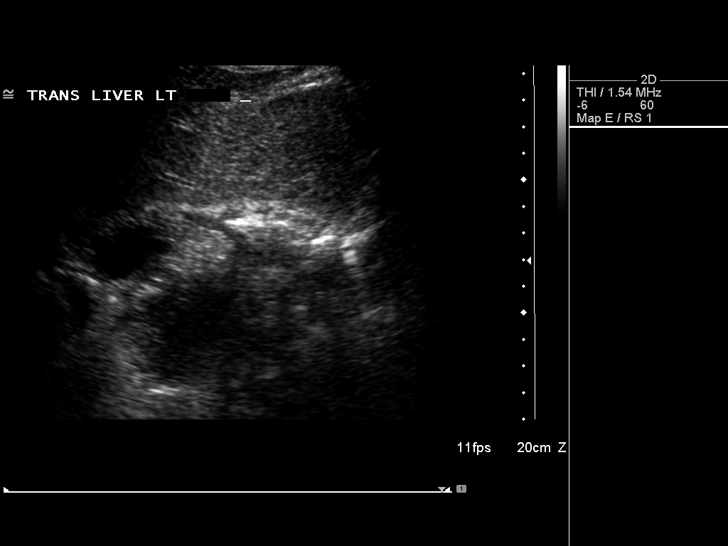
[im 34/73]
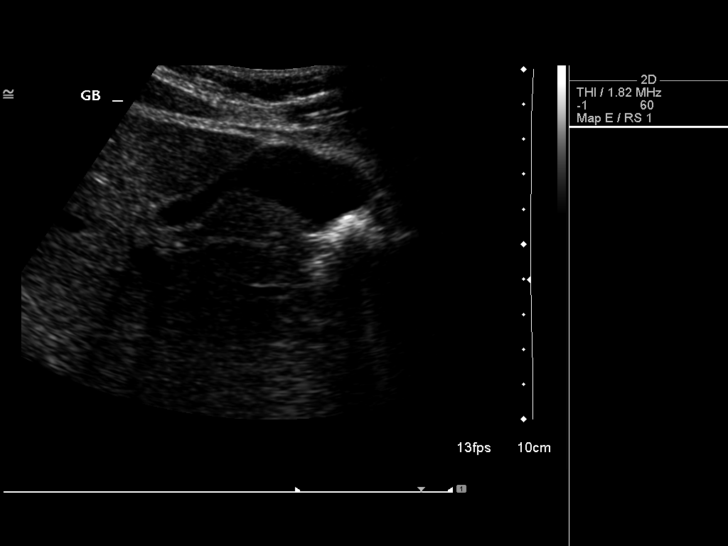
[im 40/73]
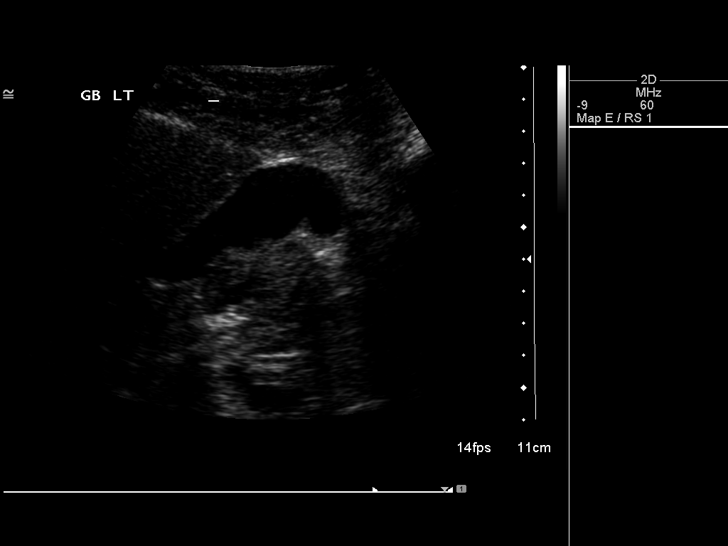
[im 46/73]
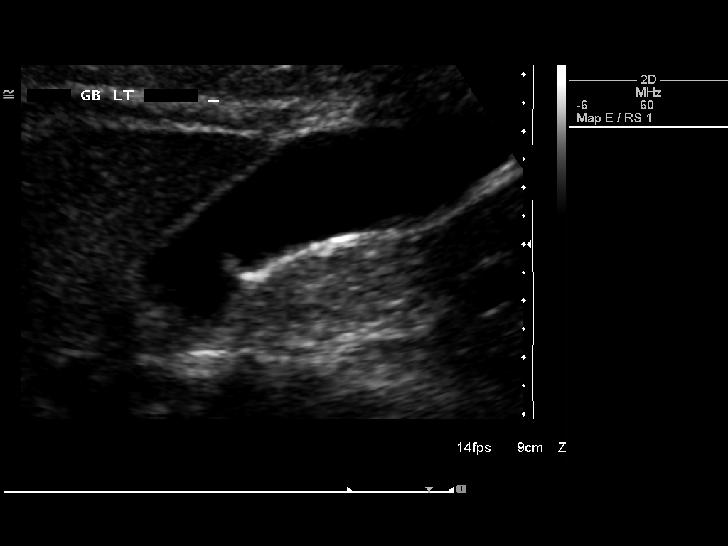
[im 49/73]
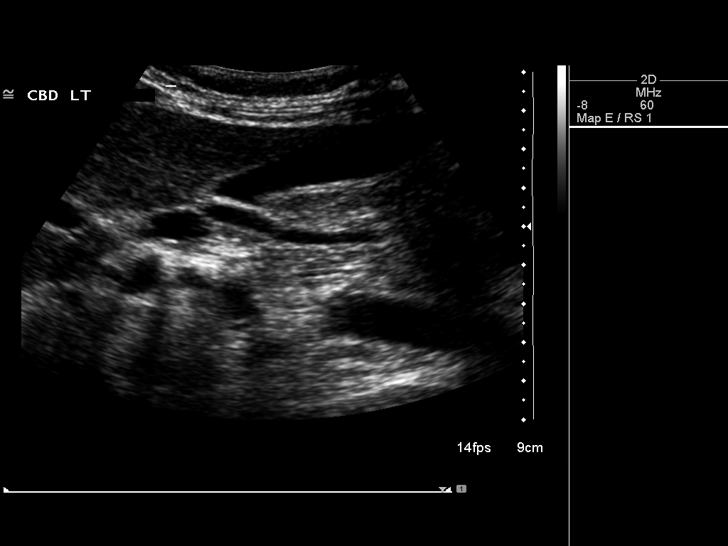
[im 55/73]
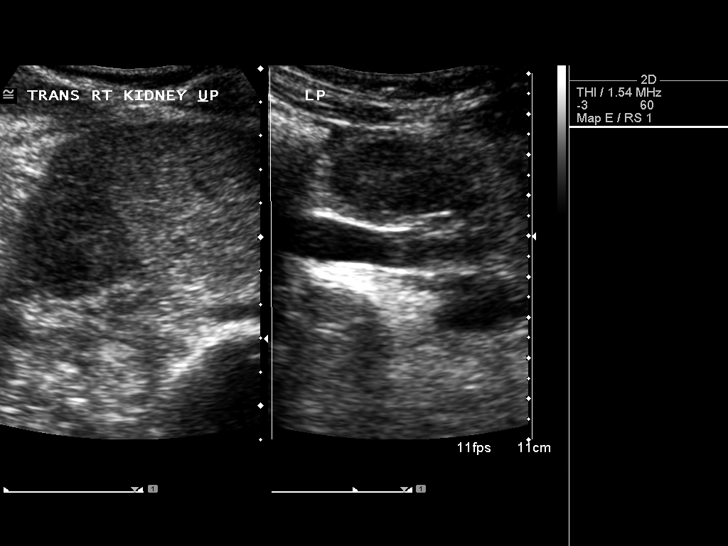
[im 61/73]
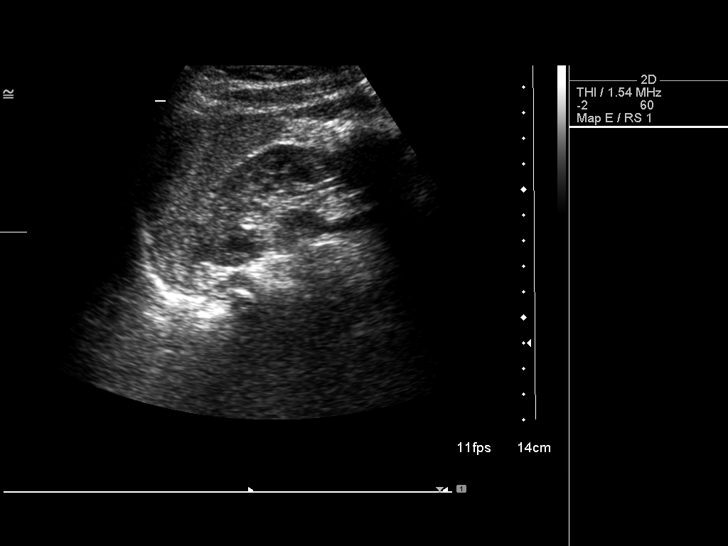
[im 67/73]
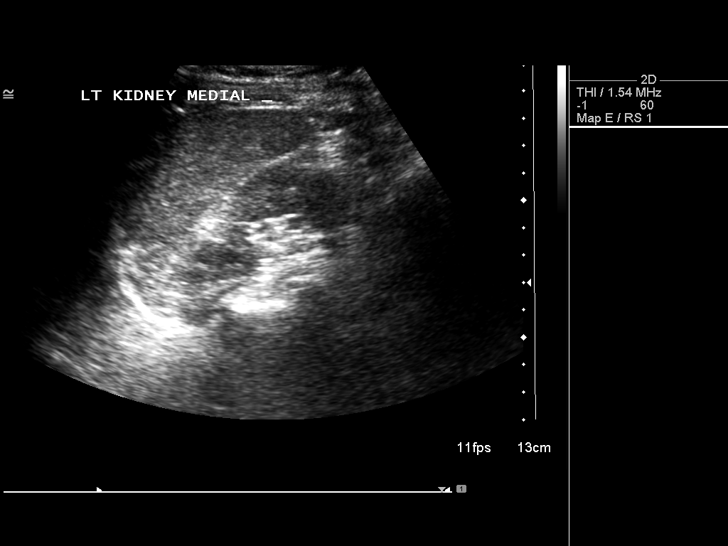
[im 73/73]
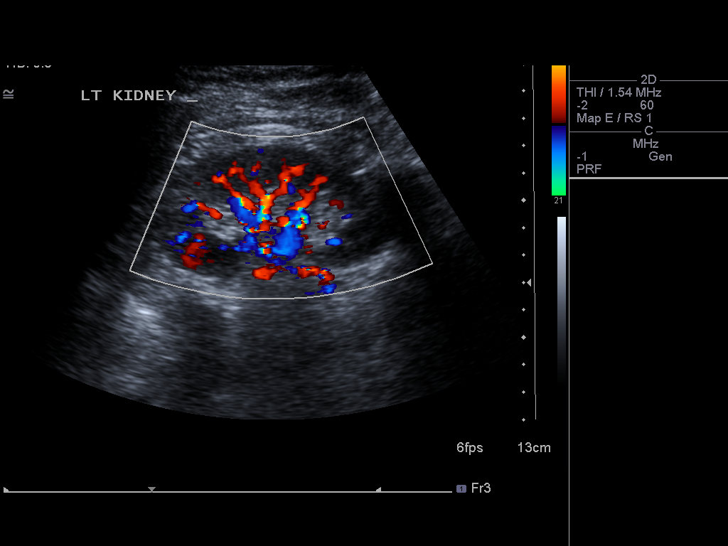

[14 of 25 positions shown; findings below may reference images not displayed]

FINDINGS: Gallbladder:

No gallstones or wall thickening visualized. No sonographic Murphy
sign noted.

Common bile duct:

Diameter: 4.6 mm.

Liver:

No focal lesion identified. Within normal limits in parenchymal
echogenicity.

IVC:

No abnormality visualized.

Pancreas:

Visualized portion unremarkable.

Spleen:

Size and appearance within normal limits.

Right Kidney:

Length: 9.5 cm. Echogenicity within normal limits. No mass or
hydronephrosis visualized.

Left Kidney:

Length: 9.2 cm.. Echogenicity within normal limits. No mass or
hydronephrosis visualized.

Abdominal aorta:

No aneurysm visualized.

Other findings:

None.
IMPRESSION: No acute abnormality noted.

## 2015-08-17 DIAGNOSIS — B009 Herpesviral infection, unspecified: Secondary | ICD-10-CM | POA: Diagnosis not present

## 2015-08-17 DIAGNOSIS — D225 Melanocytic nevi of trunk: Secondary | ICD-10-CM | POA: Diagnosis not present

## 2015-08-17 DIAGNOSIS — L821 Other seborrheic keratosis: Secondary | ICD-10-CM | POA: Diagnosis not present

## 2015-08-17 DIAGNOSIS — B078 Other viral warts: Secondary | ICD-10-CM | POA: Diagnosis not present

## 2015-09-17 ENCOUNTER — Other Ambulatory Visit: Payer: Self-pay | Admitting: Internal Medicine

## 2015-09-20 ENCOUNTER — Other Ambulatory Visit: Payer: Self-pay | Admitting: Internal Medicine

## 2015-09-22 ENCOUNTER — Encounter: Payer: Self-pay | Admitting: Internal Medicine

## 2015-09-28 ENCOUNTER — Telehealth: Payer: Self-pay

## 2015-09-28 NOTE — Telephone Encounter (Signed)
Patient called to educate on Medicare Wellness apt. LVM for the patient to call back to educate and schedule for wellness visit. Requested call back to educate; apt 10/26 at 9;45

## 2015-09-30 NOTE — Telephone Encounter (Signed)
Call to the patient to discuss AWV; States she can come in 10/18 (Tuesday) at 11am/ Scheduled and will plan to take flu shot/ CPE the following week

## 2015-10-02 ENCOUNTER — Other Ambulatory Visit (INDEPENDENT_AMBULATORY_CARE_PROVIDER_SITE_OTHER): Payer: Medicare Other

## 2015-10-02 ENCOUNTER — Other Ambulatory Visit: Payer: Self-pay | Admitting: *Deleted

## 2015-10-02 DIAGNOSIS — E782 Mixed hyperlipidemia: Secondary | ICD-10-CM | POA: Diagnosis not present

## 2015-10-02 DIAGNOSIS — Z0000 Encounter for general adult medical examination without abnormal findings: Secondary | ICD-10-CM

## 2015-10-02 DIAGNOSIS — R945 Abnormal results of liver function studies: Secondary | ICD-10-CM

## 2015-10-02 DIAGNOSIS — R7989 Other specified abnormal findings of blood chemistry: Secondary | ICD-10-CM

## 2015-10-02 LAB — CBC WITH DIFFERENTIAL/PLATELET
Basophils Absolute: 0 10*3/uL (ref 0.0–0.1)
Basophils Relative: 0.8 % (ref 0.0–3.0)
EOS PCT: 3.7 % (ref 0.0–5.0)
Eosinophils Absolute: 0.2 10*3/uL (ref 0.0–0.7)
HCT: 38.8 % (ref 36.0–46.0)
Hemoglobin: 12.9 g/dL (ref 12.0–15.0)
Lymphocytes Relative: 31.9 % (ref 12.0–46.0)
Lymphs Abs: 1.8 10*3/uL (ref 0.7–4.0)
MCHC: 33.2 g/dL (ref 30.0–36.0)
MCV: 88.6 fl (ref 78.0–100.0)
MONOS PCT: 8.5 % (ref 3.0–12.0)
Monocytes Absolute: 0.5 10*3/uL (ref 0.1–1.0)
NEUTROS ABS: 3 10*3/uL (ref 1.4–7.7)
NEUTROS PCT: 55.1 % (ref 43.0–77.0)
Platelets: 250 10*3/uL (ref 150.0–400.0)
RBC: 4.38 Mil/uL (ref 3.87–5.11)
RDW: 13.6 % (ref 11.5–15.5)
WBC: 5.5 10*3/uL (ref 4.0–10.5)

## 2015-10-02 LAB — TSH: TSH: 5.7 u[IU]/mL — AB (ref 0.35–4.50)

## 2015-10-02 LAB — URINALYSIS, ROUTINE W REFLEX MICROSCOPIC
Bilirubin Urine: NEGATIVE
Ketones, ur: NEGATIVE
Leukocytes, UA: NEGATIVE
NITRITE: NEGATIVE
Specific Gravity, Urine: 1.01 (ref 1.000–1.030)
TOTAL PROTEIN, URINE-UPE24: NEGATIVE
Urine Glucose: NEGATIVE
Urobilinogen, UA: 0.2 (ref 0.0–1.0)
pH: 6 (ref 5.0–8.0)

## 2015-10-02 LAB — LIPID PANEL
CHOLESTEROL: 174 mg/dL (ref 0–200)
HDL: 64.7 mg/dL (ref >39.00)
LDL Cholesterol: 94 mg/dL (ref 0–99)
NonHDL: 109.16
Total CHOL/HDL Ratio: 3
Triglycerides: 74 mg/dL (ref 0.0–149.0)
VLDL: 14.8 mg/dL (ref 0.0–40.0)

## 2015-10-02 LAB — HEPATIC FUNCTION PANEL
ALK PHOS: 75 U/L (ref 39–117)
ALT: 21 U/L (ref 0–35)
AST: 22 U/L (ref 0–37)
Albumin: 4.1 g/dL (ref 3.5–5.2)
BILIRUBIN DIRECT: 0.1 mg/dL (ref 0.0–0.3)
BILIRUBIN TOTAL: 0.6 mg/dL (ref 0.2–1.2)
Total Protein: 7.2 g/dL (ref 6.0–8.3)

## 2015-10-02 LAB — HEMOGLOBIN A1C: Hgb A1c MFr Bld: 5.9 % (ref 4.6–6.5)

## 2015-10-06 ENCOUNTER — Ambulatory Visit (INDEPENDENT_AMBULATORY_CARE_PROVIDER_SITE_OTHER): Payer: Medicare Other

## 2015-10-06 ENCOUNTER — Telehealth: Payer: Self-pay

## 2015-10-06 VITALS — BP 128/60 | Ht 63.0 in | Wt 111.2 lb

## 2015-10-06 DIAGNOSIS — Z23 Encounter for immunization: Secondary | ICD-10-CM | POA: Diagnosis not present

## 2015-10-06 DIAGNOSIS — Z0000 Encounter for general adult medical examination without abnormal findings: Secondary | ICD-10-CM | POA: Diagnosis not present

## 2015-10-06 MED ORDER — SYNTHROID 88 MCG PO TABS: 88.0000 ug | ORAL_TABLET | Freq: Every day | ORAL | Status: DC

## 2015-10-06 NOTE — Patient Instructions (Addendum)
Dawn Tucker , Thank you for taking time to come for your Medicare Wellness Visit. I appreciate your ongoing commitment to your health goals. Please review the following plan we discussed and let me know if I can assist you in the future.   Takes flu shot today Will check on thyroid Rx this pm    These are the goals we discussed: Goals    . patient     Maintaining health; continue to volunteer and exercise;  Continue to eat well; live well       This is a list of the screening recommended for you and due dates:  Health Maintenance  Topic Date Due  .  Hepatitis C: One time screening is recommended by Center for Disease Control  (CDC) for  adults born from [birth year] through 1965.   1946/10/28  . Flu Shot  07/20/2015  . Pneumonia vaccines (2 of 2 - PPSV23) 08/14/2016  . Colon Cancer Screening  02/15/2017  . Mammogram  03/17/2017  . Tetanus Vaccine  03/19/2023  . DEXA scan (bone density measurement)  Completed  . Shingles Vaccine  Completed     Health Maintenance, Female Adopting a healthy lifestyle and getting preventive care can go a long way to promote health and wellness. Talk with your health care provider about what schedule of regular examinations is right for you. This is a good chance for you to check in with your provider about disease prevention and staying healthy. In between checkups, there are plenty of things you can do on your own. Experts have done a lot of research about which lifestyle changes and preventive measures are most likely to keep you healthy. Ask your health care provider for more information. WEIGHT AND DIET  Eat a healthy diet  Be sure to include plenty of vegetables, fruits, low-fat dairy products, and lean protein.  Do not eat a lot of foods high in solid fats, added sugars, or salt.  Get regular exercise. This is one of the most important things you can do for your health.  Most adults should exercise for at least 150 minutes each week. The  exercise should increase your heart rate and make you sweat (moderate-intensity exercise).  Most adults should also do strengthening exercises at least twice a week. This is in addition to the moderate-intensity exercise.  Maintain a healthy weight  Body mass index (BMI) is a measurement that can be used to identify possible weight problems. It estimates body fat based on height and weight. Your health care provider can help determine your BMI and help you achieve or maintain a healthy weight.  For females 65 years of age and older:   A BMI below 18.5 is considered underweight.  A BMI of 18.5 to 24.9 is normal.  A BMI of 25 to 29.9 is considered overweight.  A BMI of 30 and above is considered obese.  Watch levels of cholesterol and blood lipids  You should start having your blood tested for lipids and cholesterol at 69 years of age, then have this test every 5 years.  You may need to have your cholesterol levels checked more often if:  Your lipid or cholesterol levels are high.  You are older than 69 years of age.  You are at high risk for heart disease.  CANCER SCREENING   Lung Cancer  Lung cancer screening is recommended for adults 75-46 years old who are at high risk for lung cancer because of a history of smoking.  A  yearly low-dose CT scan of the lungs is recommended for people who:  Currently smoke.  Have quit within the past 15 years.  Have at least a 30-pack-year history of smoking. A pack year is smoking an average of one pack of cigarettes a day for 1 year.  Yearly screening should continue until it has been 15 years since you quit.  Yearly screening should stop if you develop a health problem that would prevent you from having lung cancer treatment.  Breast Cancer  Practice breast self-awareness. This means understanding how your breasts normally appear and feel.  It also means doing regular breast self-exams. Let your health care provider know about  any changes, no matter how small.  If you are in your 20s or 30s, you should have a clinical breast exam (CBE) by a health care provider every 1-3 years as part of a regular health exam.  If you are 33 or older, have a CBE every year. Also consider having a breast X-ray (mammogram) every year.  If you have a family history of breast cancer, talk to your health care provider about genetic screening.  If you are at high risk for breast cancer, talk to your health care provider about having an MRI and a mammogram every year.  Breast cancer gene (BRCA) assessment is recommended for women who have family members with BRCA-related cancers. BRCA-related cancers include:  Breast.  Ovarian.  Tubal.  Peritoneal cancers.  Results of the assessment will determine the need for genetic counseling and BRCA1 and BRCA2 testing. Cervical Cancer Your health care provider may recommend that you be screened regularly for cancer of the pelvic organs (ovaries, uterus, and vagina). This screening involves a pelvic examination, including checking for microscopic changes to the surface of your cervix (Pap test). You may be encouraged to have this screening done every 3 years, beginning at age 31.  For women ages 47-65, health care providers may recommend pelvic exams and Pap testing every 3 years, or they may recommend the Pap and pelvic exam, combined with testing for human papilloma virus (HPV), every 5 years. Some types of HPV increase your risk of cervical cancer. Testing for HPV may also be done on women of any age with unclear Pap test results.  Other health care providers may not recommend any screening for nonpregnant women who are considered low risk for pelvic cancer and who do not have symptoms. Ask your health care provider if a screening pelvic exam is right for you.  If you have had past treatment for cervical cancer or a condition that could lead to cancer, you need Pap tests and screening for  cancer for at least 20 years after your treatment. If Pap tests have been discontinued, your risk factors (such as having a new sexual partner) need to be reassessed to determine if screening should resume. Some women have medical problems that increase the chance of getting cervical cancer. In these cases, your health care provider may recommend more frequent screening and Pap tests. Colorectal Cancer  This type of cancer can be detected and often prevented.  Routine colorectal cancer screening usually begins at 69 years of age and continues through 69 years of age.  Your health care provider may recommend screening at an earlier age if you have risk factors for colon cancer.  Your health care provider may also recommend using home test kits to check for hidden blood in the stool.  A small camera at the end of a tube  can be used to examine your colon directly (sigmoidoscopy or colonoscopy). This is done to check for the earliest forms of colorectal cancer.  Routine screening usually begins at age 12.  Direct examination of the colon should be repeated every 5-10 years through 69 years of age. However, you may need to be screened more often if early forms of precancerous polyps or small growths are found. Skin Cancer  Check your skin from head to toe regularly.  Tell your health care provider about any new moles or changes in moles, especially if there is a change in a mole's shape or color.  Also tell your health care provider if you have a mole that is larger than the size of a pencil eraser.  Always use sunscreen. Apply sunscreen liberally and repeatedly throughout the day.  Protect yourself by wearing long sleeves, pants, a wide-brimmed hat, and sunglasses whenever you are outside. HEART DISEASE, DIABETES, AND HIGH BLOOD PRESSURE   High blood pressure causes heart disease and increases the risk of stroke. High blood pressure is more likely to develop in:  People who have blood  pressure in the high end of the normal range (130-139/85-89 mm Hg).  People who are overweight or obese.  People who are African American.  If you are 54-32 years of age, have your blood pressure checked every 3-5 years. If you are 49 years of age or older, have your blood pressure checked every year. You should have your blood pressure measured twice--once when you are at a hospital or clinic, and once when you are not at a hospital or clinic. Record the average of the two measurements. To check your blood pressure when you are not at a hospital or clinic, you can use:  An automated blood pressure machine at a pharmacy.  A home blood pressure monitor.  If you are between 34 years and 26 years old, ask your health care provider if you should take aspirin to prevent strokes.  Have regular diabetes screenings. This involves taking a blood sample to check your fasting blood sugar level.  If you are at a normal weight and have a low risk for diabetes, have this test once every three years after 69 years of age.  If you are overweight and have a high risk for diabetes, consider being tested at a younger age or more often. PREVENTING INFECTION  Hepatitis B  If you have a higher risk for hepatitis B, you should be screened for this virus. You are considered at high risk for hepatitis B if:  You were born in a country where hepatitis B is common. Ask your health care provider which countries are considered high risk.  Your parents were born in a high-risk country, and you have not been immunized against hepatitis B (hepatitis B vaccine).  You have HIV or AIDS.  You use needles to inject street drugs.  You live with someone who has hepatitis B.  You have had sex with someone who has hepatitis B.  You get hemodialysis treatment.  You take certain medicines for conditions, including cancer, organ transplantation, and autoimmune conditions. Hepatitis C  Blood testing is recommended  for:  Everyone born from [birth year] through 1965.  Anyone with known risk factors for hepatitis C. Sexually transmitted infections (STIs)  You should be screened for sexually transmitted infections (STIs) including gonorrhea and chlamydia if:  You are sexually active and are younger than 69 years of age.  You are older than 69 years of  age and your health care provider tells you that you are at risk for this type of infection.  Your sexual activity has changed since you were last screened and you are at an increased risk for chlamydia or gonorrhea. Ask your health care provider if you are at risk.  If you do not have HIV, but are at risk, it may be recommended that you take a prescription medicine daily to prevent HIV infection. This is called pre-exposure prophylaxis (PrEP). You are considered at risk if:  You are sexually active and do not regularly use condoms or know the HIV status of your partner(s).  You take drugs by injection.  You are sexually active with a partner who has HIV. Talk with your health care provider about whether you are at high risk of being infected with HIV. If you choose to begin PrEP, you should first be tested for HIV. You should then be tested every 3 months for as long as you are taking PrEP.  PREGNANCY   If you are premenopausal and you may become pregnant, ask your health care provider about preconception counseling.  If you may become pregnant, take 400 to 800 micrograms (mcg) of folic acid every day.  If you want to prevent pregnancy, talk to your health care provider about birth control (contraception). OSTEOPOROSIS AND MENOPAUSE   Osteoporosis is a disease in which the bones lose minerals and strength with aging. This can result in serious bone fractures. Your risk for osteoporosis can be identified using a bone density scan.  If you are 76 years of age or older, or if you are at risk for osteoporosis and fractures, ask your health care provider if you  should be screened.  Ask your health care provider whether you should take a calcium or vitamin D supplement to lower your risk for osteoporosis.  Menopause may have certain physical symptoms and risks.  Hormone replacement therapy may reduce some of these symptoms and risks. Talk to your health care provider about whether hormone replacement therapy is right for you.  HOME CARE INSTRUCTIONS   Schedule regular health, dental, and eye exams.  Stay current with your immunizations.   Do not use any tobacco products including cigarettes, chewing tobacco, or electronic cigarettes.  If you are pregnant, do not drink alcohol.  If you are breastfeeding, limit how much and how often you drink alcohol.  Limit alcohol intake to no more than 1 drink per day for nonpregnant women. One drink equals 12 ounces of beer, 5 ounces of wine, or 1 ounces of hard liquor.  Do not use street drugs.  Do not share needles.  Ask your health care provider for help if you need support or information about quitting drugs.  Tell your health care provider if you often feel depressed.  Tell your health care provider if you have ever been abused or do not feel safe at home.   This information is not intended to replace advice given to you by your health care provider. Make sure you discuss any questions you have with your health care provider.   Document Released: 06/20/2011 Document Revised: 12/26/2014 Document Reviewed: 11/06/2013 Elsevier Interactive Patient Education [employer].

## 2015-10-06 NOTE — Telephone Encounter (Signed)
Synthroid increased to 88 mcg. Please have patient return for follow up TSH in 6 weeks.

## 2015-10-06 NOTE — Telephone Encounter (Signed)
Patient in today for AWV; Questioned lab result on  10/14; TSH 5.70; On synthroid 75 mcg po qd currently. Referred to Terri Piedra to review and the patient informed to check for new rx this pm. Pharmacy confirmed for [facility] on [address].   Marya Amsler, Please review and advise in Dr. Judeen Hammans absence.  Thanks

## 2015-10-06 NOTE — Progress Notes (Addendum)
Subjective:   Dawn Tucker is a 69 y.o. female who presents for Medicare Annual (Subsequent) preventive examination.  Review of Systems: Deferred to CPE   HRA assessment completed during visit;  The Patient was informed that this wellness visit is to identify risk and educate on how to reduce risk for increase disease through lifestyle changes.   ROS deferred to CPE exam with physician  Medical issues  Labs a1c 5.9; chol 174; Trig 74; HDL 64; LDL 94; ration 3 Educated on prediabetes but the patient is currently doing personal training with weights; Walks 3.5 miles q o d and does the elliptical for cardio;    HTN BP good today; discusses feeling a little dizzy from sitting to standing at hs; Goes slow and feels fine;  No c/o of heart issues; palpitations etc; Will check BP at home when lying in the evening and when she stands up. Will discuss with Dr. Alain Marion at CPE   Esophageal stricture is stretched x 1 time a year Osteoporosis managed by GYN; but did decide to hold x 2 years and will recheck; Is doing weight bearing exercise.   Hyperlipidemia; managed by Statin qod to resolve muscle symptoms.  Sleep reviewed: Uses Ambien at times but does not want to get "addicted" to it. Educated on the need for sleep and educated on sleep hygiene as well as the time it takes to restore your sleep pattern. The patient takes when in [place] with grand children during the week and stops when she gets home on the weekends;  Sleep is vital to health and to take medicine as needed and also try other things that may be helpful if possible; Educated on short half life of medication in that it does not stay in her system.   BMI: 19.7 and this is her normal weight Diet; have cereal with fiber and fruit; coffee; lunch; eat greek yogurt; and fruit if home Supper; salmon at hs; chicken; Eats well  Exercise; mostly elliptical 40 to 50 min 5 days a week; fast walk 3.5 miles; 2 times a week do trainer  for weights and balance; core exercises every am.  Was a runner; and had minor back problems and stopped and adapted to other exercise  SAFETY; upstairs and downstairs but has a  Chair lift that goes up stairs due to her spouse's knee and hip surgery.  They are In transition; may consider "downsizing" at some point but loves their home and neighborhood.   Safety reviewed for the home; including removal of clutter; clear paths through the home,  railing as needed; bathroom safety (bathroom remodeled  Downstairs due to spouses surgeries)  community safety; smoke detectors and firearms safety as well as sun protection;  Driving accidents ( no accidents) and seatbelt;  Stressors; every thing is good  Medication review/ Reviewed for meds; compliant; very health conscious   Fall assessment no falls  Gait assessment; normal  Mobilization and Functional losses in the last year. None  Sleep patterns; educated; overall sleeps good; uses Ambien 1/2 if needed    Urinary or fecal incontinence reviewed/ no issues  Dr. Henrene Pastor stretches the esophagus;   Counseling:  Hep C; Discussed and will refer to [facility] if she chooses to have this drawn.  Colonoscopy March 2008 and probably 2018 EKG 08/15/2011 Dexa scan; 03/31/2015 Bone density -2.6; Hx of Fosamax discontinued; (had osteopenia) Hold on medication and repeat dexa x 2 years per Dr. 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 Michel Santee   Immunizations Due  Flu - Given today  Current Care Team reviewed and updated   Cardiac Risk Factors include: advanced age (>35mn, >>39women);dyslipidemia;hypertension     Objective:     Vitals: BP 128/60 mmHg  Ht 5' 3"  (1.6 m)  Wt 111 lb 4 oz (50.463 kg)  BMI 19.71 kg/m2  Tobacco History  Smoking status  . Never Smoker   Smokeless tobacco  . Never Used     Counseling given: Not Answered   Past  Medical History  Diagnosis Date  . CHEST PAIN 03/23/2010  . DEGENERATIVE DISC DISEASE 08/07/2008  . DYSPHAGIA UNSPECIFIED 03/23/2010  . ESOPHAGEAL STRICTURE 01/10/2011  . FIBROIDS, UTERUS 08/07/2008  . GERD 03/23/2010  . HIATAL HERNIA 01/10/2011  . HYPOTHYROIDISM 08/07/2008  . IRRITABLE BOWEL SYNDROME, HX OF 08/07/2008  . Mixed hyperlipidemia 08/07/2008  . Osteoporosis 03/2015    T score -2.6 overall stable from prior DEXA 2014  . PERSONAL HX COLONIC POLYPS 03/23/2010  . Scarlet fever 08/07/2008  . Unspecified pruritic disorder 08/07/2008  . VAGINITIS, ATROPHIC 08/07/2008  . Herpes   . Dysphagia    Past Surgical History  Procedure Laterality Date  . Rhinoplasty    . Breast surgery      Breast biopsies right & left- benign lesions  . Cesarean section    . Tubal ligation    . Colposcopy    . Colonoscopy    . Gynecologic cryosurgery  by Dr. ADorathy Kinsman   low-grade dysplasia   Family History  Problem Relation Age of Onset  . Pneumonia Mother   . Heart disease Mother 751   CAD  . Coronary artery disease Father   . Heart disease Father 464   CAD/MI-fatal  . Breast cancer Paternal Aunt 314 . Ovarian cancer Paternal Aunt     dx in her 755s . Prostate cancer Maternal Grandfather   . Diabetes Neg Hx   . Colon cancer Neg Hx   . Rectal cancer Neg Hx   . Stomach cancer Neg Hx   . Breast cancer Sister 655   BRCA negative  . Hypertension Sister    History  Sexual Activity  . Sexual Activity:  . Partners: Male  . Birth Control/ Protection: Post-menopausal    Comment: INTERCOURSE AGE 27, LESS THAN 5 SEXUAL PARTNERS    Outpatient Encounter Prescriptions as of 10/06/2015  Medication Sig  . atorvastatin (LIPITOR) 10 MG tablet TAKE 1 TABLET BY MOUTH DAILY  . Calcium-Vitamin D-Vitamin K 5076-226-33MG-UNT-MCG CHEW Chew by mouth daily.    .Marland Kitchendexlansoprazole (DEXILANT) 60 MG capsule TAKE 1 CAPSULE BY MOUTH DAILY  . Estradiol (VAGIFEM) 10 MCG TABS vaginal tablet INSERT ONE TABLET VAGINALLY TWICE  WEEKLY  . losartan (COZAAR) 50 MG tablet TAKE 1 TABLET BY MOUTH DAILY  . Multiple Vitamin (MULTIVITAMIN) tablet Take 1 tablet by mouth daily.    .Marland Kitchennystatin-triamcinolone ointment (MYCOLOG) Apply topically 2 (two) times daily as needed.  . ranitidine (ZANTAC) 150 MG tablet Take 150 mg by mouth as needed.    . simethicone (MYLICON) 1354MG chewable tablet Chew 125 mg by mouth every 6 (six) hours as needed.    .Marland KitchenSYNTHROID 75 MCG tablet TAKE 1 TABLET BY MOUTH ONCE DAILY BEFORE BREAKFAST  . VAGIFEM 10 MCG TABS vaginal tablet INSERT 1 TABLET VAGINALLY TWICE WEEKLY  . valACYclovir (VALTREX) 1000 MG tablet Take 1  tablet (1,000 mg total) by mouth as needed. Take 2 tabs AM, 2 tabs PM for one day for fever  blister  . zolpidem (AMBIEN) 5 MG tablet Take 1 tablet (5 mg total) by mouth at bedtime as needed. for sleep  . [DISCONTINUED] nitrofurantoin (MACRODANTIN) 50 MG capsule Take as directed - with intercourse (Patient not taking: Reported on 04/30/2015)   No facility-administered encounter medications on file as of 10/06/2015.    Activities of Daily Living In your present state of health, do you have any difficulty performing the following activities: 10/06/2015  Hearing? N  Vision? N  Difficulty concentrating or making decisions? N  Walking or climbing stairs? N  Dressing or bathing? N  Doing errands, shopping? N  Preparing Food and eating ? N  Using the Toilet? N  In the past six months, have you accidently leaked urine? N  Do you have problems with loss of bowel control? N  Managing your Medications? N  Managing your Finances? N  Housekeeping or managing your Housekeeping? N    Patient Care Team: Cassandria Anger, MD as PCP - General (Internal Medicine) Elsie Saas, MD (Orthopedic Surgery) Irene Shipper, MD (Gastroenterology) Crista Luria, MD (Dermatology) Rutherford Guys, MD (Ophthalmology) Anastasio Auerbach, MD as Consulting Physician (Gynecology)    Assessment:    Assessment     Patient presents for yearly preventative medicine examination. Medicare questionnaire screening were completed, i.e. Functional; fall risk; depression, memory loss and hearing. / no issues  All immunizations and health maintenance protocols were reviewed with the patient and needed orders were placed. To note; the patient has had PSV 23; the last around 64 so will not proceed with PSV 23 post 65 per guidelines;  Did take the flu shot today  Education provided for laboratory screens;    Medication reconciliation, past medical history, social history, problem list and allergies were reviewed in detail with the patient  Goals were established with regard to weight loss, exercise, and diet in compliance with medications based on the patient individualized risk;  Discussed recent TSH on 10/14 was 5.70 with Terri Piedra and will send him basket note to address. Patient to check for new rx this pm.  Discussed dizziness and denies any chest pain or issues Will check BP prior to getting up and when standing a couple of time and will review with Dr. Alain Marion at her CPE   End of life planning was discussed and has been completed   Exercise Activities and Dietary recommendations Current Exercise Habits:: Structured exercise class;Home exercise routine, Time (Minutes): 60, Frequency (Times/Week): 6, Weekly Exercise (Minutes/Week): 360, Intensity: Moderate  Goals    . patient     Maintaining health; continue to volunteer and exercise;  Continue to eat well; live well      Fall Risk Fall Risk  10/06/2015 02/09/2015  Falls in the past year? No No   Depression Screen PHQ 2/9 Scores 10/06/2015 02/09/2015  PHQ - 2 Score 0 0     Cognitive Testing MMSE - Mini Mental State Exam 10/06/2015  Not completed: (No Data)   AD8 Score 0  Immunization History  Administered Date(s) Administered  . H1N1 11/26/2008  . Influenza Split 08/28/2012  . Influenza,inj,Quad PF,36+ Mos 09/16/2013, 09/23/2014,  10/06/2015  . Pneumococcal Conjugate-13 09/16/2013  . Pneumococcal Polysaccharide-23 07/28/2010, 08/15/2011  . Td 09/19/2009  . Tetanus 03/18/2013  . Zoster 09/14/2006   Screening Tests Health Maintenance  Topic Date Due  . Hepatitis C Screening  Jan 09, 1946  .  INFLUENZA VACCINE  07/20/2015  . PNA vac Low Risk Adult (2 of 2 - PPSV23) 08/14/2016  . COLONOSCOPY  02/15/2017  . MAMMOGRAM  03/17/2017  . TETANUS/TDAP  03/19/2023  . DEXA SCAN  Completed  . ZOSTAVAX  Completed      Plan:   Took Flu shot today  Discussed episodes of dizziness from lying to standing at hs; will check BP both lying and standing prior to apt for CPE; No chest pain or palpitations; Is taking small does of ambien on some nights; Feels this is related to her BP med. Only notices at hs;   Also noted TSH drawn 10/14 was 5.70 and sent result to [facility] in Dr. Judeen Hammans absence; Wll review for med adjustment  Discussed sleep etiquette and the importance of sleep for the body    During the course of the visit the patient was educated and counseled about the following appropriate screening and preventive services:   Vaccines to include Pneumoccal, Influenza, Hepatitis B, Td, Zostavax, HCV  Electrocardiogram; 07/2011  Cardiovascular Disease/ none noted but father did have heart disease with 3 CABG prior to dying; Mother developed pneumonia and expired from ARD   Colorectal cancer screening/ Due March 2018  Bone density screening/ reviewed by GYN; to fup dexa in 2 years and did not fup on medication for osteoporosis at this time; may consider in 2 years; will continue to take calcium and Vit d and continue exercise  Diabetes screening/ 5.9 and discussed pre-diabetes; FBS under 100 in the last 2 years; the patient is monitoring diet and exercise   Glaucoma screening/ neg / eye exam tbs soon  Mammography/PAP/ completed 02/2015  Nutrition counseling; good nutrition plan with CVD recommended diet of  fish; limited red meat and fruits and vegetables. Cholesterol in good control  Patient Instructions (the written plan) was given to the patient.   OHCSP,ZZCKI, RN  10/06/2015   Medical screening examination/treatment/procedure(s) were performed by non-physician practitioner and as supervising physician I was immediately available for consultation/collaboration. I agree with above. Walker Kehr, MD

## 2015-10-08 ENCOUNTER — Other Ambulatory Visit: Payer: Self-pay

## 2015-10-08 DIAGNOSIS — E039 Hypothyroidism, unspecified: Secondary | ICD-10-CM

## 2015-10-14 ENCOUNTER — Ambulatory Visit (INDEPENDENT_AMBULATORY_CARE_PROVIDER_SITE_OTHER): Payer: Medicare Other | Admitting: Internal Medicine

## 2015-10-14 ENCOUNTER — Telehealth: Payer: Self-pay | Admitting: Internal Medicine

## 2015-10-14 ENCOUNTER — Encounter: Payer: Self-pay | Admitting: Internal Medicine

## 2015-10-14 VITALS — BP 128/90 | HR 61 | Ht 63.0 in | Wt 113.0 lb

## 2015-10-14 DIAGNOSIS — M2550 Pain in unspecified joint: Secondary | ICD-10-CM

## 2015-10-14 DIAGNOSIS — R3129 Other microscopic hematuria: Secondary | ICD-10-CM

## 2015-10-14 DIAGNOSIS — E782 Mixed hyperlipidemia: Secondary | ICD-10-CM | POA: Diagnosis not present

## 2015-10-14 DIAGNOSIS — I10 Essential (primary) hypertension: Secondary | ICD-10-CM

## 2015-10-14 DIAGNOSIS — Z0000 Encounter for general adult medical examination without abnormal findings: Secondary | ICD-10-CM | POA: Diagnosis not present

## 2015-10-14 DIAGNOSIS — E039 Hypothyroidism, unspecified: Secondary | ICD-10-CM | POA: Diagnosis not present

## 2015-10-14 MED ORDER — DEXLANSOPRAZOLE 60 MG PO CPDR: DELAYED_RELEASE_CAPSULE | ORAL | Status: DC

## 2015-10-14 MED ORDER — ZOLPIDEM TARTRATE 5 MG PO TABS: 5.0000 mg | ORAL_TABLET | Freq: Every evening | ORAL | Status: DC | PRN

## 2015-10-14 MED ORDER — ATORVASTATIN CALCIUM 10 MG PO TABS: 10.0000 mg | ORAL_TABLET | Freq: Every day | ORAL | Status: DC

## 2015-10-14 MED ORDER — LOSARTAN POTASSIUM 50 MG PO TABS: 50.0000 mg | ORAL_TABLET | Freq: Every day | ORAL | Status: DC

## 2015-10-14 NOTE — Assessment & Plan Note (Addendum)
Re-check UA in 6 wks

## 2015-10-14 NOTE — Progress Notes (Signed)
Pre visit review using our clinic review tool, if applicable. No additional management support is needed unless otherwise documented below in the visit note. 

## 2015-10-14 NOTE — Assessment & Plan Note (Addendum)
Doing well ?Vit D ?

## 2015-10-14 NOTE — Assessment & Plan Note (Signed)
Cont Losartan

## 2015-10-14 NOTE — Assessment & Plan Note (Addendum)
TSH in 6 wks Levothroid is increased to 88 mcg/d

## 2015-10-14 NOTE — Assessment & Plan Note (Signed)

## 2015-10-14 NOTE — Telephone Encounter (Signed)
Wants to make sure labs are entered for next CPE.

## 2015-10-14 NOTE — Patient Instructions (Signed)
Preventive Care for Adults, Female A healthy lifestyle and preventive care can promote health and wellness. Preventive health guidelines for women include the following key practices.  A routine yearly physical is a good way to check with your health care provider about your health and preventive screening. It is a chance to share any concerns and updates on your health and to receive a thorough exam.  Visit your dentist for a routine exam and preventive care every 6 months. Brush your teeth twice a day and floss once a day. Good oral hygiene prevents tooth decay and gum disease.  The frequency of eye exams is based on your age, health, family medical history, use of contact lenses, and other factors. Follow your health care provider's recommendations for frequency of eye exams.  Eat a healthy diet. Foods like vegetables, fruits, whole grains, low-fat dairy products, and lean protein foods contain the nutrients you need without too many calories. Decrease your intake of foods high in solid fats, added sugars, and salt. Eat the right amount of calories for you.Get information about a proper diet from your health care provider, if necessary.  Regular physical exercise is one of the most important things you can do for your health. Most adults should get at least 150 minutes of moderate-intensity exercise (any activity that increases your heart rate and causes you to sweat) each week. In addition, most adults need muscle-strengthening exercises on 2 or more days a week.  Maintain a healthy weight. The body mass index (BMI) is a screening tool to identify possible weight problems. It provides an estimate of body fat based on height and weight. Your health care provider can find your BMI and can help you achieve or maintain a healthy weight.For adults 20 years and older:  A BMI below 18.5 is considered underweight.  A BMI of 18.5 to 24.9 is normal.  A BMI of 25 to 29.9 is considered overweight.  A  BMI of 30 and above is considered obese.  Maintain normal blood lipids and cholesterol levels by exercising and minimizing your intake of saturated fat. Eat a balanced diet with plenty of fruit and vegetables. Blood tests for lipids and cholesterol should begin at age 45 and be repeated every 5 years. If your lipid or cholesterol levels are high, you are over 50, or you are at high risk for heart disease, you may need your cholesterol levels checked more frequently.Ongoing high lipid and cholesterol levels should be treated with medicines if diet and exercise are not working.  If you smoke, find out from your health care provider how to quit. If you do not use tobacco, do not start.  Lung cancer screening is recommended for adults aged 45-80 years who are at high risk for developing lung cancer because of a history of smoking. A yearly low-dose CT scan of the lungs is recommended for people who have at least a 30-pack-year history of smoking and are a current smoker or have quit within the past 15 years. A pack year of smoking is smoking an average of 1 pack of cigarettes a day for 1 year (for example: 1 pack a day for 30 years or 2 packs a day for 15 years). Yearly screening should continue until the smoker has stopped smoking for at least 15 years. Yearly screening should be stopped for people who develop a health problem that would prevent them from having lung cancer treatment.  If you are pregnant, do not drink alcohol. If you are  breastfeeding, be very cautious about drinking alcohol. If you are not pregnant and choose to drink alcohol, do not have more than 1 drink per day. One drink is considered to be 12 ounces (355 mL) of beer, 5 ounces (148 mL) of wine, or 1.5 ounces (44 mL) of liquor.  Avoid use of street drugs. Do not share needles with anyone. Ask for help if you need support or instructions about stopping the use of drugs.  High blood pressure causes heart disease and increases the risk  of stroke. Your blood pressure should be checked at least every 1 to 2 years. Ongoing high blood pressure should be treated with medicines if weight loss and exercise do not work.  If you are 55-79 years old, ask your health care provider if you should take aspirin to prevent strokes.  Diabetes screening is done by taking a blood sample to check your blood glucose level after you have not eaten for a certain period of time (fasting). If you are not overweight and you do not have risk factors for diabetes, you should be screened once every 3 years starting at age 45. If you are overweight or obese and you are 40-70 years of age, you should be screened for diabetes every year as part of your cardiovascular risk assessment.  Breast cancer screening is essential preventive care for women. You should practice "breast self-awareness." This means understanding the normal appearance and feel of your breasts and may include breast self-examination. Any changes detected, no matter how small, should be reported to a health care provider. Women in their 20s and 30s should have a clinical breast exam (CBE) by a health care provider as part of a regular health exam every 1 to 3 years. After age 40, women should have a CBE every year. Starting at age 40, women should consider having a mammogram (breast X-ray test) every year. Women who have a family history of breast cancer should talk to their health care provider about genetic screening. Women at a high risk of breast cancer should talk to their health care providers about having an MRI and a mammogram every year.  Breast cancer gene (BRCA)-related cancer risk assessment is recommended for women who have family members with BRCA-related cancers. BRCA-related cancers include breast, ovarian, tubal, and peritoneal cancers. Having family members with these cancers may be associated with an increased risk for harmful changes (mutations) in the breast cancer genes BRCA1 and  BRCA2. Results of the assessment will determine the need for genetic counseling and BRCA1 and BRCA2 testing.  Your health care provider may recommend that you be screened regularly for cancer of the pelvic organs (ovaries, uterus, and vagina). This screening involves a pelvic examination, including checking for microscopic changes to the surface of your cervix (Pap test). You may be encouraged to have this screening done every 3 years, beginning at age 21.  For women ages 30-65, health care providers may recommend pelvic exams and Pap testing every 3 years, or they may recommend the Pap and pelvic exam, combined with testing for human papilloma virus (HPV), every 5 years. Some types of HPV increase your risk of cervical cancer. Testing for HPV may also be done on women of any age with unclear Pap test results.  Other health care providers may not recommend any screening for nonpregnant women who are considered low risk for pelvic cancer and who do not have symptoms. Ask your health care provider if a screening pelvic exam is right for   you.  If you have had past treatment for cervical cancer or a condition that could lead to cancer, you need Pap tests and screening for cancer for at least 20 years after your treatment. If Pap tests have been discontinued, your risk factors (such as having a new sexual partner) need to be reassessed to determine if screening should resume. Some women have medical problems that increase the chance of getting cervical cancer. In these cases, your health care provider may recommend more frequent screening and Pap tests.  Colorectal cancer can be detected and often prevented. Most routine colorectal cancer screening begins at the age of 50 years and continues through age 75 years. However, your health care provider may recommend screening at an earlier age if you have risk factors for colon cancer. On a yearly basis, your health care provider may provide home test kits to check  for hidden blood in the stool. Use of a small camera at the end of a tube, to directly examine the colon (sigmoidoscopy or colonoscopy), can detect the earliest forms of colorectal cancer. Talk to your health care provider about this at age 50, when routine screening begins. Direct exam of the colon should be repeated every 5-10 years through age 75 years, unless early forms of precancerous polyps or small growths are found.  People who are at an increased risk for hepatitis B should be screened for this virus. You are considered at high risk for hepatitis B if:  You were born in a country where hepatitis B occurs often. Talk with your health care provider about which countries are considered high risk.  Your parents were born in a high-risk country and you have not received a shot to protect against hepatitis B (hepatitis B vaccine).  You have HIV or AIDS.  You use needles to inject street drugs.  You live with, or have sex with, someone who has hepatitis B.  You get hemodialysis treatment.  You take certain medicines for conditions like cancer, organ transplantation, and autoimmune conditions.  Hepatitis C blood testing is recommended for all people born from 1945 through 1965 and any individual with known risks for hepatitis C.  Practice safe sex. Use condoms and avoid high-risk sexual practices to reduce the spread of sexually transmitted infections (STIs). STIs include gonorrhea, chlamydia, syphilis, trichomonas, herpes, HPV, and human immunodeficiency virus (HIV). Herpes, HIV, and HPV are viral illnesses that have no cure. They can result in disability, cancer, and death.  You should be screened for sexually transmitted illnesses (STIs) including gonorrhea and chlamydia if:  You are sexually active and are younger than 24 years.  You are older than 24 years and your health care provider tells you that you are at risk for this type of infection.  Your sexual activity has changed  since you were last screened and you are at an increased risk for chlamydia or gonorrhea. Ask your health care provider if you are at risk.  If you are at risk of being infected with HIV, it is recommended that you take a prescription medicine daily to prevent HIV infection. This is called preexposure prophylaxis (PrEP). You are considered at risk if:  You are sexually active and do not regularly use condoms or know the HIV status of your partner(s).  You take drugs by injection.  You are sexually active with a partner who has HIV.  Talk with your health care provider about whether you are at high risk of being infected with HIV. If   you choose to begin PrEP, you should first be tested for HIV. You should then be tested every 3 months for as long as you are taking PrEP.  Osteoporosis is a disease in which the bones lose minerals and strength with aging. This can result in serious bone fractures or breaks. The risk of osteoporosis can be identified using a bone density scan. Women ages 67 years and over and women at risk for fractures or osteoporosis should discuss screening with their health care providers. Ask your health care provider whether you should take a calcium supplement or vitamin D to reduce the rate of osteoporosis.  Menopause can be associated with physical symptoms and risks. Hormone replacement therapy is available to decrease symptoms and risks. You should talk to your health care provider about whether hormone replacement therapy is right for you.  Use sunscreen. Apply sunscreen liberally and repeatedly throughout the day. You should seek shade when your shadow is shorter than you. Protect yourself by wearing long sleeves, pants, a wide-brimmed hat, and sunglasses year round, whenever you are outdoors.  Once a month, do a whole body skin exam, using a mirror to look at the skin on your back. Tell your health care provider of new moles, moles that have irregular borders, moles that  are larger than a pencil eraser, or moles that have changed in shape or color.  Stay current with required vaccines (immunizations).  Influenza vaccine. All adults should be immunized every year.  Tetanus, diphtheria, and acellular pertussis (Td, Tdap) vaccine. Pregnant women should receive 1 dose of Tdap vaccine during each pregnancy. The dose should be obtained regardless of the length of time since the last dose. Immunization is preferred during the 27th-36th week of gestation. An adult who has not previously received Tdap or who does not know her vaccine status should receive 1 dose of Tdap. This initial dose should be followed by tetanus and diphtheria toxoids (Td) booster doses every 10 years. Adults with an unknown or incomplete history of completing a 3-dose immunization series with Td-containing vaccines should begin or complete a primary immunization series including a Tdap dose. Adults should receive a Td booster every 10 years.  Varicella vaccine. An adult without evidence of immunity to varicella should receive 2 doses or a second dose if she has previously received 1 dose. Pregnant females who do not have evidence of immunity should receive the first dose after pregnancy. This first dose should be obtained before leaving the health care facility. The second dose should be obtained 4-8 weeks after the first dose.  Human papillomavirus (HPV) vaccine. Females aged 13-26 years who have not received the vaccine previously should obtain the 3-dose series. The vaccine is not recommended for use in pregnant females. However, pregnancy testing is not needed before receiving a dose. If a female is found to be pregnant after receiving a dose, no treatment is needed. In that case, the remaining doses should be delayed until after the pregnancy. Immunization is recommended for any person with an immunocompromised condition through the age of 61 years if she did not get any or all doses earlier. During the  3-dose series, the second dose should be obtained 4-8 weeks after the first dose. The third dose should be obtained 24 weeks after the first dose and 16 weeks after the second dose.  Zoster vaccine. One dose is recommended for adults aged 30 years or older unless certain conditions are present.  Measles, mumps, and rubella (MMR) vaccine. Adults born  before 1957 generally are considered immune to measles and mumps. Adults born in 1957 or later should have 1 or more doses of MMR vaccine unless there is a contraindication to the vaccine or there is laboratory evidence of immunity to each of the three diseases. A routine second dose of MMR vaccine should be obtained at least 28 days after the first dose for students attending postsecondary schools, health care workers, or international travelers. People who received inactivated measles vaccine or an unknown type of measles vaccine during 1963-1967 should receive 2 doses of MMR vaccine. People who received inactivated mumps vaccine or an unknown type of mumps vaccine before 1979 and are at high risk for mumps infection should consider immunization with 2 doses of MMR vaccine. For females of childbearing age, rubella immunity should be determined. If there is no evidence of immunity, females who are not pregnant should be vaccinated. If there is no evidence of immunity, females who are pregnant should delay immunization until after pregnancy. Unvaccinated health care workers born before 1957 who lack laboratory evidence of measles, mumps, or rubella immunity or laboratory confirmation of disease should consider measles and mumps immunization with 2 doses of MMR vaccine or rubella immunization with 1 dose of MMR vaccine.  Pneumococcal 13-valent conjugate (PCV13) vaccine. When indicated, a person who is uncertain of his immunization history and has no record of immunization should receive the PCV13 vaccine. All adults 65 years of age and older should receive this  vaccine. An adult aged 19 years or older who has certain medical conditions and has not been previously immunized should receive 1 dose of PCV13 vaccine. This PCV13 should be followed with a dose of pneumococcal polysaccharide (PPSV23) vaccine. Adults who are at high risk for pneumococcal disease should obtain the PPSV23 vaccine at least 8 weeks after the dose of PCV13 vaccine. Adults older than 69 years of age who have normal immune system function should obtain the PPSV23 vaccine dose at least 1 year after the dose of PCV13 vaccine.  Pneumococcal polysaccharide (PPSV23) vaccine. When PCV13 is also indicated, PCV13 should be obtained first. All adults aged 65 years and older should be immunized. An adult younger than age 65 years who has certain medical conditions should be immunized. Any person who resides in a nursing home or long-term care facility should be immunized. An adult smoker should be immunized. People with an immunocompromised condition and certain other conditions should receive both PCV13 and PPSV23 vaccines. People with human immunodeficiency virus (HIV) infection should be immunized as soon as possible after diagnosis. Immunization during chemotherapy or radiation therapy should be avoided. Routine use of PPSV23 vaccine is not recommended for American Indians, [place] Natives, or people younger than 65 years unless there are medical conditions that require PPSV23 vaccine. When indicated, people who have unknown immunization and have no record of immunization should receive PPSV23 vaccine. One-time revaccination 5 years after the first dose of PPSV23 is recommended for people aged 19-64 years who have chronic kidney failure, nephrotic syndrome, asplenia, or immunocompromised conditions. People who received 1-2 doses of PPSV23 before age 65 years should receive another dose of PPSV23 vaccine at age 65 years or later if at least 5 years have passed since the previous dose. Doses of PPSV23 are not  needed for people immunized with PPSV23 at or after age 65 years.  Meningococcal vaccine. Adults with asplenia or persistent complement component deficiencies should receive 2 doses of quadrivalent meningococcal conjugate (MenACWY-D) vaccine. The doses should be obtained   at least 2 months apart. Microbiologists working with certain meningococcal bacteria, [place] recruits, people at risk during an outbreak, and people who travel to or live in countries with a high rate of meningitis should be immunized. A first-year college student up through age 34 years who is living in a residence hall should receive a dose if she did not receive a dose on or after her 16th birthday. Adults who have certain high-risk conditions should receive one or more doses of vaccine.  Hepatitis A vaccine. Adults who wish to be protected from this disease, have certain high-risk conditions, work with hepatitis A-infected animals, work in hepatitis A research labs, or travel to or work in countries with a high rate of hepatitis A should be immunized. Adults who were previously unvaccinated and who anticipate close contact with an international adoptee during the first 60 days after arrival in the [place] from a country with a high rate of hepatitis A should be immunized.  Hepatitis B vaccine. Adults who wish to be protected from this disease, have certain high-risk conditions, may be exposed to blood or other infectious body fluids, are household contacts or sex partners of hepatitis B positive people, are clients or workers in certain care facilities, or travel to or work in countries with a high rate of hepatitis B should be immunized.  Haemophilus influenzae type b (Hib) vaccine. A previously unvaccinated person with asplenia or sickle cell disease or having a scheduled splenectomy should receive 1 dose of Hib vaccine. Regardless of previous immunization, a recipient of a hematopoietic stem cell transplant should receive a  3-dose series 6-12 months after her successful transplant. Hib vaccine is not recommended for adults with HIV infection. Preventive Services / Frequency Ages 35 to 4 years  Blood pressure check.** / Every 3-5 years.  Lipid and cholesterol check.** / Every 5 years beginning at age 60.  Clinical breast exam.** / Every 3 years for women in their 71s and 10s.  BRCA-related cancer risk assessment.** / For women who have family members with a BRCA-related cancer (breast, ovarian, tubal, or peritoneal cancers).  Pap test.** / Every 2 years from ages 76 through 26. Every 3 years starting at age 61 through age 76 or 93 with a history of 3 consecutive normal Pap tests.  HPV screening.** / Every 3 years from ages 37 through ages 60 to 51 with a history of 3 consecutive normal Pap tests.  Hepatitis C blood test.** / For any individual with known risks for hepatitis C.  Skin self-exam. / Monthly.  Influenza vaccine. / Every year.  Tetanus, diphtheria, and acellular pertussis (Tdap, Td) vaccine.** / Consult your health care provider. Pregnant women should receive 1 dose of Tdap vaccine during each pregnancy. 1 dose of Td every 10 years.  Varicella vaccine.** / Consult your health care provider. Pregnant females who do not have evidence of immunity should receive the first dose after pregnancy.  HPV vaccine. / 3 doses over 6 months, if 93 and younger. The vaccine is not recommended for use in pregnant females. However, pregnancy testing is not needed before receiving a dose.  Measles, mumps, rubella (MMR) vaccine.** / You need at least 1 dose of MMR if you were born in 1957 or later. You may also need a 2nd dose. For females of childbearing age, rubella immunity should be determined. If there is no evidence of immunity, females who are not pregnant should be vaccinated. If there is no evidence of immunity, females who are  pregnant should delay immunization until after pregnancy.  Pneumococcal  13-valent conjugate (PCV13) vaccine.** / Consult your health care provider.  Pneumococcal polysaccharide (PPSV23) vaccine.** / 1 to 2 doses if you smoke cigarettes or if you have certain conditions.  Meningococcal vaccine.** / 1 dose if you are age 68 to 8 years and a Market researcher living in a residence hall, or have one of several medical conditions, you need to get vaccinated against meningococcal disease. You may also need additional booster doses.  Hepatitis A vaccine.** / Consult your health care provider.  Hepatitis B vaccine.** / Consult your health care provider.  Haemophilus influenzae type b (Hib) vaccine.** / Consult your health care provider. Ages 7 to 53 years  Blood pressure check.** / Every year.  Lipid and cholesterol check.** / Every 5 years beginning at age 25 years.  Lung cancer screening. / Every year if you are aged 11-80 years and have a 30-pack-year history of smoking and currently smoke or have quit within the past 15 years. Yearly screening is stopped once you have quit smoking for at least 15 years or develop a health problem that would prevent you from having lung cancer treatment.  Clinical breast exam.** / Every year after age 48 years.  BRCA-related cancer risk assessment.** / For women who have family members with a BRCA-related cancer (breast, ovarian, tubal, or peritoneal cancers).  Mammogram.** / Every year beginning at age 41 years and continuing for as long as you are in good health. Consult with your health care provider.  Pap test.** / Every 3 years starting at age 65 years through age 37 or 70 years with a history of 3 consecutive normal Pap tests.  HPV screening.** / Every 3 years from ages 72 years through ages 60 to 40 years with a history of 3 consecutive normal Pap tests.  Fecal occult blood test (FOBT) of stool. / Every year beginning at age 21 years and continuing until age 5 years. You may not need to do this test if you get  a colonoscopy every 10 years.  Flexible sigmoidoscopy or colonoscopy.** / Every 5 years for a flexible sigmoidoscopy or every 10 years for a colonoscopy beginning at age 35 years and continuing until age 48 years.  Hepatitis C blood test.** / For all people born from [birth year] through 1965 and any individual with known risks for hepatitis C.  Skin self-exam. / Monthly.  Influenza vaccine. / Every year.  Tetanus, diphtheria, and acellular pertussis (Tdap/Td) vaccine.** / Consult your health care provider. Pregnant women should receive 1 dose of Tdap vaccine during each pregnancy. 1 dose of Td every 10 years.  Varicella vaccine.** / Consult your health care provider. Pregnant females who do not have evidence of immunity should receive the first dose after pregnancy.  Zoster vaccine.** / 1 dose for adults aged 30 years or older.  Measles, mumps, rubella (MMR) vaccine.** / You need at least 1 dose of MMR if you were born in 1957 or later. You may also need a second dose. For females of childbearing age, rubella immunity should be determined. If there is no evidence of immunity, females who are not pregnant should be vaccinated. If there is no evidence of immunity, females who are pregnant should delay immunization until after pregnancy.  Pneumococcal 13-valent conjugate (PCV13) vaccine.** / Consult your health care provider.  Pneumococcal polysaccharide (PPSV23) vaccine.** / 1 to 2 doses if you smoke cigarettes or if you have certain conditions.  Meningococcal vaccine.** /  Consult your health care provider.  Hepatitis A vaccine.** / Consult your health care provider.  Hepatitis B vaccine.** / Consult your health care provider.  Haemophilus influenzae type b (Hib) vaccine.** / Consult your health care provider. Ages 64 years and over  Blood pressure check.** / Every year.  Lipid and cholesterol check.** / Every 5 years beginning at age 23 years.  Lung cancer screening. / Every year if you  are aged 16-80 years and have a 30-pack-year history of smoking and currently smoke or have quit within the past 15 years. Yearly screening is stopped once you have quit smoking for at least 15 years or develop a health problem that would prevent you from having lung cancer treatment.  Clinical breast exam.** / Every year after age 74 years.  BRCA-related cancer risk assessment.** / For women who have family members with a BRCA-related cancer (breast, ovarian, tubal, or peritoneal cancers).  Mammogram.** / Every year beginning at age 44 years and continuing for as long as you are in good health. Consult with your health care provider.  Pap test.** / Every 3 years starting at age 58 years through age 22 or 39 years with 3 consecutive normal Pap tests. Testing can be stopped between 65 and 70 years with 3 consecutive normal Pap tests and no abnormal Pap or HPV tests in the past 10 years.  HPV screening.** / Every 3 years from ages 64 years through ages 70 or 61 years with a history of 3 consecutive normal Pap tests. Testing can be stopped between 65 and 70 years with 3 consecutive normal Pap tests and no abnormal Pap or HPV tests in the past 10 years.  Fecal occult blood test (FOBT) of stool. / Every year beginning at age 40 years and continuing until age 27 years. You may not need to do this test if you get a colonoscopy every 10 years.  Flexible sigmoidoscopy or colonoscopy.** / Every 5 years for a flexible sigmoidoscopy or every 10 years for a colonoscopy beginning at age 7 years and continuing until age 32 years.  Hepatitis C blood test.** / For all people born from [birth year] through 1965 and any individual with known risks for hepatitis C.  Osteoporosis screening.** / A one-time screening for women ages 30 years and over and women at risk for fractures or osteoporosis.  Skin self-exam. / Monthly.  Influenza vaccine. / Every year.  Tetanus, diphtheria, and acellular pertussis (Tdap/Td)  vaccine.** / 1 dose of Td every 10 years.  Varicella vaccine.** / Consult your health care provider.  Zoster vaccine.** / 1 dose for adults aged 35 years or older.  Pneumococcal 13-valent conjugate (PCV13) vaccine.** / Consult your health care provider.  Pneumococcal polysaccharide (PPSV23) vaccine.** / 1 dose for all adults aged 46 years and older.  Meningococcal vaccine.** / Consult your health care provider.  Hepatitis A vaccine.** / Consult your health care provider.  Hepatitis B vaccine.** / Consult your health care provider.  Haemophilus influenzae type b (Hib) vaccine.** / Consult your health care provider. ** Family history and personal history of risk and conditions may change your health care provider's recommendations.   This information is not intended to replace advice given to you by your health care provider. Make sure you discuss any questions you have with your health care provider.   Document Released: 01/31/2002 Document Revised: 12/26/2014 Document Reviewed: 05/02/2011 Elsevier Interactive Patient Education [employer].

## 2015-10-14 NOTE — Assessment & Plan Note (Signed)
On Lipitor Cardiac CT Ca sore test pros and cons discussed

## 2015-10-14 NOTE — Telephone Encounter (Signed)
Done. Thx.

## 2015-10-14 NOTE — Progress Notes (Signed)
Subjective:  Patient ID: Dawn Tucker, female    DOB: 01-26-1946  Age: 69 y.o. MRN: [id number]  CC: No chief complaint on file.   HPI [name] presents for a well exam. F/u HTN, insomnia, hypothyroidism  Outpatient Prescriptions Prior to Visit  Medication Sig Dispense Refill  . Calcium-Vitamin D-Vitamin K [id number] MG-UNT-MCG CHEW Chew by mouth daily.      . Estradiol (VAGIFEM) 10 MCG TABS vaginal tablet INSERT ONE TABLET VAGINALLY TWICE WEEKLY 8 tablet 12  . Multiple Vitamin (MULTIVITAMIN) tablet Take 1 tablet by mouth daily.      Marland Kitchen nystatin-triamcinolone ointment (MYCOLOG) Apply topically 2 (two) times daily as needed.    . ranitidine (ZANTAC) 150 MG tablet Take 150 mg by mouth as needed.      . simethicone (MYLICON) 324 MG chewable tablet Chew 125 mg by mouth every 6 (six) hours as needed.      Marland Kitchen SYNTHROID 88 MCG tablet Take 1 tablet (88 mcg total) by mouth daily before breakfast. 30 tablet 1  . VAGIFEM 10 MCG TABS vaginal tablet INSERT 1 TABLET VAGINALLY TWICE WEEKLY 8 tablet 11  . valACYclovir (VALTREX) 1000 MG tablet Take 1 tablet (1,000 mg total) by mouth as needed. Take 2 tabs AM, 2 tabs PM for one day for fever  blister 360 tablet 0  . atorvastatin (LIPITOR) 10 MG tablet TAKE 1 TABLET BY MOUTH DAILY 30 tablet 0  . dexlansoprazole (DEXILANT) 60 MG capsule TAKE 1 CAPSULE BY MOUTH DAILY 30 capsule 11  . losartan (COZAAR) 50 MG tablet TAKE 1 TABLET BY MOUTH DAILY 90 tablet 0  . zolpidem (AMBIEN) 5 MG tablet Take 1 tablet (5 mg total) by mouth at bedtime as needed. for sleep 30 tablet 5   No facility-administered medications prior to visit.    ROS Review of Systems  Constitutional: Negative for chills, activity change, appetite change, fatigue and unexpected weight change.  HENT: Negative for congestion, mouth sores and sinus pressure.   Eyes: Negative for visual disturbance.  Respiratory: Negative for cough and chest tightness.   Gastrointestinal: Negative for  nausea and abdominal pain.  Genitourinary: Negative for frequency, difficulty urinating and vaginal pain.  Musculoskeletal: Negative for back pain and gait problem.  Skin: Negative for pallor and rash.  Neurological: Negative for dizziness, tremors, weakness, numbness and headaches.  Psychiatric/Behavioral: Positive for suicidal ideas. Negative for confusion and sleep disturbance.    Objective:  BP 128/90 mmHg  Pulse 61  Ht 5\' 3"  (1.6 m)  Wt 113 lb (51.256 kg)  BMI 20.02 kg/m2  SpO2 99%  BP Readings from Last 3 Encounters:  10/14/15 128/90  10/06/15 128/60  04/30/15 120/76    Wt Readings from Last 3 Encounters:  10/14/15 113 lb (51.256 kg)  10/06/15 111 lb 4 oz (50.463 kg)  02/09/15 113 lb 8 oz (51.483 kg)    Physical Exam  Constitutional: She appears well-developed. No distress.  HENT:  Head: Normocephalic.  Right Ear: External ear normal.  Left Ear: External ear normal.  Nose: Nose normal.  Mouth/Throat: Oropharynx is clear and moist.  Eyes: Conjunctivae are normal. Pupils are equal, round, and reactive to light. Right eye exhibits no discharge. Left eye exhibits no discharge.  Neck: Normal range of motion. Neck supple. No JVD present. No tracheal deviation present. No thyromegaly present.  Cardiovascular: Normal rate, regular rhythm and normal heart sounds.   Pulmonary/Chest: No stridor. No respiratory distress. She has no wheezes.  Abdominal: Soft. Bowel sounds are  normal. She exhibits no distension and no mass. There is no tenderness. There is no rebound and no guarding.  Musculoskeletal: She exhibits no edema or tenderness.  Lymphadenopathy:    She has no cervical adenopathy.  Neurological: She displays normal reflexes. No cranial nerve deficit. She exhibits normal muscle tone. Coordination normal.  Skin: No rash noted. No erythema.  Psychiatric: She has a normal mood and affect. Her behavior is normal. Judgment and thought content normal.    Lab Results    Component Value Date   WBC 5.5 10/02/2015   HGB 12.9 10/02/2015   HCT 38.8 10/02/2015   PLT 250.0 10/02/2015   GLUCOSE 93 02/06/2015   CHOL 174 10/02/2015   TRIG 74.0 10/02/2015   HDL 64.70 10/02/2015   LDLCALC 94 10/02/2015   ALT 21 10/02/2015   AST 22 10/02/2015   NA 139 02/06/2015   K 4.5 02/06/2015   CL 103 02/06/2015   CREATININE 0.97 02/06/2015   BUN 15 02/06/2015   CO2 28 02/06/2015   TSH 5.70* 10/02/2015   HGBA1C 5.9 10/02/2015    Mm Screening Breast Tomo Bilateral  03/18/2015  CLINICAL DATA:  Screening. EXAM: DIGITAL SCREENING BILATERAL MAMMOGRAM WITH 3D TOMO WITH CAD COMPARISON:  Previous exam(s). ACR Breast Density Category b: There are scattered areas of fibroglandular density. FINDINGS: There are no findings suspicious for malignancy. Images were processed with CAD. IMPRESSION: No mammographic evidence of malignancy. A result letter of this screening mammogram will be mailed directly to the patient. RECOMMENDATION: Screening mammogram in one year. (Code:SM-B-01Y) BI-RADS CATEGORY  1: Negative. Electronically Signed   By: Altamese Cabal M.D.   On: 03/18/2015 16:31    Assessment & Plan:   Diagnoses and all orders for this visit:  Arthralgia  Microhematuria  Hypothyroidism, unspecified hypothyroidism type  Other orders -     atorvastatin (LIPITOR) 10 MG tablet; Take 1 tablet (10 mg total) by mouth daily. -     dexlansoprazole (DEXILANT) 60 MG capsule; TAKE 1 CAPSULE BY MOUTH DAILY -     losartan (COZAAR) 50 MG tablet; Take 1 tablet (50 mg total) by mouth daily. -     zolpidem (AMBIEN) 5 MG tablet; Take 1 tablet (5 mg total) by mouth at bedtime as needed. for sleep  I have changed Ms. Perriello's atorvastatin and losartan. I am also having her maintain her Calcium-Vitamin D-Vitamin K, simethicone, multivitamin, ranitidine, valACYclovir, nystatin-triamcinolone ointment, Estradiol, VAGIFEM, SYNTHROID, dexlansoprazole, and zolpidem.  Meds ordered this encounter   Medications  . atorvastatin (LIPITOR) 10 MG tablet    Sig: Take 1 tablet (10 mg total) by mouth daily.    Dispense:  90 tablet    Refill:  3  . dexlansoprazole (DEXILANT) 60 MG capsule    Sig: TAKE 1 CAPSULE BY MOUTH DAILY    Dispense:  90 capsule    Refill:  3  . losartan (COZAAR) 50 MG tablet    Sig: Take 1 tablet (50 mg total) by mouth daily.    Dispense:  90 tablet    Refill:  3  . zolpidem (AMBIEN) 5 MG tablet    Sig: Take 1 tablet (5 mg total) by mouth at bedtime as needed. for sleep    Dispense:  90 tablet    Refill:  1     Follow-up: Return in about 4 months (around 02/14/2016) for a follow-up visit.  Walker Kehr, MD

## 2015-10-15 ENCOUNTER — Other Ambulatory Visit: Payer: Self-pay | Admitting: Internal Medicine

## 2015-11-18 ENCOUNTER — Other Ambulatory Visit: Payer: Self-pay | Admitting: Internal Medicine

## 2015-11-26 ENCOUNTER — Other Ambulatory Visit: Payer: Self-pay | Admitting: Family

## 2015-12-03 ENCOUNTER — Other Ambulatory Visit (INDEPENDENT_AMBULATORY_CARE_PROVIDER_SITE_OTHER): Payer: Medicare Other

## 2015-12-03 DIAGNOSIS — Z0000 Encounter for general adult medical examination without abnormal findings: Secondary | ICD-10-CM | POA: Diagnosis not present

## 2015-12-03 DIAGNOSIS — E782 Mixed hyperlipidemia: Secondary | ICD-10-CM | POA: Diagnosis not present

## 2015-12-03 DIAGNOSIS — I10 Essential (primary) hypertension: Secondary | ICD-10-CM

## 2015-12-03 DIAGNOSIS — E039 Hypothyroidism, unspecified: Secondary | ICD-10-CM

## 2015-12-03 DIAGNOSIS — R3129 Other microscopic hematuria: Secondary | ICD-10-CM

## 2015-12-03 DIAGNOSIS — M2550 Pain in unspecified joint: Secondary | ICD-10-CM | POA: Diagnosis not present

## 2015-12-03 LAB — BASIC METABOLIC PANEL
BUN: 14 mg/dL (ref 6–23)
CALCIUM: 9.1 mg/dL (ref 8.4–10.5)
CO2: 30 meq/L (ref 19–32)
CREATININE: 1.02 mg/dL (ref 0.40–1.20)
Chloride: 103 mEq/L (ref 96–112)
GFR: 57.09 mL/min — ABNORMAL LOW (ref >60.00)
GLUCOSE: 102 mg/dL — AB (ref 70–99)
Potassium: 4.3 mEq/L (ref 3.5–5.1)
Sodium: 139 mEq/L (ref 135–145)

## 2015-12-03 LAB — CBC WITH DIFFERENTIAL/PLATELET
Basophils Absolute: 0 K/uL (ref 0.0–0.1)
Basophils Relative: 0.3 % (ref 0.0–3.0)
Eosinophils Absolute: 0.1 K/uL (ref 0.0–0.7)
Eosinophils Relative: 1.7 % (ref 0.0–5.0)
HCT: 38.6 % (ref 36.0–46.0)
Hemoglobin: 12.8 g/dL (ref 12.0–15.0)
Lymphocytes Relative: 24.1 % (ref 12.0–46.0)
Lymphs Abs: 1.8 K/uL (ref 0.7–4.0)
MCHC: 33.2 g/dL (ref 30.0–36.0)
MCV: 88.5 fl (ref 78.0–100.0)
Monocytes Absolute: 0.5 K/uL (ref 0.1–1.0)
Monocytes Relative: 7.3 % (ref 3.0–12.0)
Neutro Abs: 4.9 K/uL (ref 1.4–7.7)
Neutrophils Relative %: 66.6 % (ref 43.0–77.0)
Platelets: 263 K/uL (ref 150.0–400.0)
RBC: 4.37 Mil/uL (ref 3.87–5.11)
RDW: 13 % (ref 11.5–15.5)
WBC: 7.3 K/uL (ref 4.0–10.5)

## 2015-12-03 LAB — URINALYSIS
Bilirubin Urine: NEGATIVE
HGB URINE DIPSTICK: NEGATIVE
Ketones, ur: NEGATIVE
LEUKOCYTES UA: NEGATIVE
NITRITE: NEGATIVE
Total Protein, Urine: NEGATIVE
UROBILINOGEN UA: 0.2 (ref 0.0–1.0)
Urine Glucose: NEGATIVE
pH: 6.5 (ref 5.0–8.0)

## 2015-12-03 LAB — LIPID PANEL
Cholesterol: 180 mg/dL (ref 0–200)
HDL: 68.5 mg/dL (ref >39.00)
LDL Cholesterol: 85 mg/dL (ref 0–99)
NonHDL: 111.1
Total CHOL/HDL Ratio: 3
Triglycerides: 130 mg/dL (ref 0.0–149.0)
VLDL: 26 mg/dL (ref 0.0–40.0)

## 2015-12-03 LAB — HEPATIC FUNCTION PANEL
ALT: 22 U/L (ref 0–35)
AST: 22 U/L (ref 0–37)
Albumin: 4.2 g/dL (ref 3.5–5.2)
Alkaline Phosphatase: 74 U/L (ref 39–117)
Bilirubin, Direct: 0.1 mg/dL (ref 0.0–0.3)
Total Bilirubin: 0.5 mg/dL (ref 0.2–1.2)
Total Protein: 7.2 g/dL (ref 6.0–8.3)

## 2015-12-03 LAB — TSH: TSH: 0.16 u[IU]/mL — ABNORMAL LOW (ref 0.35–4.50)

## 2015-12-08 ENCOUNTER — Other Ambulatory Visit: Payer: Self-pay | Admitting: Internal Medicine

## 2015-12-08 ENCOUNTER — Encounter: Payer: Self-pay | Admitting: Internal Medicine

## 2015-12-08 DIAGNOSIS — E034 Atrophy of thyroid (acquired): Secondary | ICD-10-CM

## 2015-12-16 ENCOUNTER — Other Ambulatory Visit: Payer: Self-pay | Admitting: Internal Medicine

## 2016-01-20 ENCOUNTER — Other Ambulatory Visit (INDEPENDENT_AMBULATORY_CARE_PROVIDER_SITE_OTHER): Payer: Medicare Other

## 2016-01-20 DIAGNOSIS — E034 Atrophy of thyroid (acquired): Secondary | ICD-10-CM

## 2016-01-20 DIAGNOSIS — E038 Other specified hypothyroidism: Secondary | ICD-10-CM | POA: Diagnosis not present

## 2016-01-20 LAB — T3, FREE: T3 FREE: 3.1 pg/mL (ref 2.3–4.2)

## 2016-01-20 LAB — TSH: TSH: 0.16 u[IU]/mL — ABNORMAL LOW (ref 0.35–4.50)

## 2016-01-26 ENCOUNTER — Telehealth: Payer: Self-pay | Admitting: *Deleted

## 2016-01-26 ENCOUNTER — Encounter: Payer: Self-pay | Admitting: Women's Health

## 2016-01-26 ENCOUNTER — Ambulatory Visit (INDEPENDENT_AMBULATORY_CARE_PROVIDER_SITE_OTHER): Payer: Medicare Other | Admitting: Women's Health

## 2016-01-26 VITALS — BP 118/80 | Ht 63.0 in | Wt 114.0 lb

## 2016-01-26 DIAGNOSIS — N952 Postmenopausal atrophic vaginitis: Secondary | ICD-10-CM | POA: Diagnosis not present

## 2016-01-26 DIAGNOSIS — M810 Age-related osteoporosis without current pathological fracture: Secondary | ICD-10-CM | POA: Diagnosis not present

## 2016-01-26 DIAGNOSIS — Z01419 Encounter for gynecological examination (general) (routine) without abnormal findings: Secondary | ICD-10-CM

## 2016-01-26 DIAGNOSIS — N9489 Other specified conditions associated with female genital organs and menstrual cycle: Secondary | ICD-10-CM

## 2016-01-26 DIAGNOSIS — N898 Other specified noninflammatory disorders of vagina: Secondary | ICD-10-CM

## 2016-01-26 DIAGNOSIS — B373 Candidiasis of vulva and vagina: Secondary | ICD-10-CM | POA: Diagnosis not present

## 2016-01-26 DIAGNOSIS — B3731 Acute candidiasis of vulva and vagina: Secondary | ICD-10-CM

## 2016-01-26 LAB — URINALYSIS W MICROSCOPIC + REFLEX CULTURE
Bacteria, UA: NONE SEEN [HPF]
Bilirubin Urine: NEGATIVE
CASTS: NONE SEEN [LPF]
Crystals: NONE SEEN [HPF]
Glucose, UA: NEGATIVE
KETONES UR: NEGATIVE
LEUKOCYTES UA: NEGATIVE
NITRITE: NEGATIVE
PH: 5.5 (ref 5.0–8.0)
Protein, ur: NEGATIVE
RBC / HPF: NONE SEEN RBC/HPF (ref <=2)
WBC, UA: NONE SEEN WBC/HPF (ref <=5)
YEAST: NONE SEEN [HPF]

## 2016-01-26 LAB — WET PREP FOR TRICH, YEAST, CLUE
CLUE CELLS WET PREP: NONE SEEN
TRICH WET PREP: NONE SEEN

## 2016-01-26 MED ORDER — SYNTHROID 88 MCG PO TABS: ORAL_TABLET | ORAL | Status: DC

## 2016-01-26 MED ORDER — ESTRADIOL 10 MCG VA TABS: ORAL_TABLET | VAGINAL | Status: DC

## 2016-01-26 MED ORDER — FLUCONAZOLE 150 MG PO TABS: 150.0000 mg | ORAL_TABLET | Freq: Once | ORAL | Status: DC

## 2016-01-26 NOTE — Progress Notes (Signed)
Dawn Tucker 01/14/1946 [id number]    History:    Presents for breast and pelvic exam. Postmenopausal on Vagifem with some relief of vaginal dryness with no bleeding. Normal mammogram history. 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 Negative colonoscopy 2016. Cryo-in her 70s with all normal Paps after. Hypothyroidism, hypertension and hypercholesterolemia managed by primary care. Current on vaccines. DEXA 2016 T score -2.6 of the femoral neck on has been on Fosamax for 5 years in the past. Active healthy lifestyle diet and exercise.   Past medical history, past surgical history, family history and social history were all reviewed and documented in the EPIC chart. 2 children both live in [place]. Sister breast cancer BRCA negative.  ROS:  A ROS was performed and pertinent positives and negatives are included.  Exam:  Filed Vitals:   01/26/16 1122  BP: 118/80    General appearance:  Normal Thyroid:  Symmetrical, normal in size, without palpable masses or nodularity. Respiratory  Auscultation:  Clear without wheezing or rhonchi Cardiovascular  Auscultation:  Regular rate, without rubs, murmurs or gallops  Edema/varicosities:  Not grossly evident Abdominal  Soft,nontender, without masses, guarding or rebound.  Liver/spleen:  No organomegaly noted  Hernia:  None appreciated  Skin  Inspection:  Grossly normal   Breasts: Examined lying and sitting.     Right: Without masses, retractions, discharge or axillary adenopathy.     Left: Without masses, retractions, discharge or axillary adenopathy. Gentitourinary   Inguinal/mons:  Normal without inguinal adenopathy  External genitalia:  Normal  BUS/Urethra/Skene's glands:  Normal  Vagina:  Atrophic, wet prep positive for yeast  Cervix:  Normal  Uterus:  normal in size, shape and contour.  Midline and mobile  Adnexa/parametria:     Rt: Without masses or tenderness.   Lt: Without masses or tenderness.  Anus  and perineum: Normal  Digital rectal exam: Normal sphincter tone without palpated masses or tenderness  Assessment/Plan:  70 y.o. MWF G2 P2 for breast and pelvic exam with complaint of vaginal odor.  Postmenopausal with no bleeding on Vagifem Vaginal atrophy some relief with Vagifem Hypertension/hyper hypercholesteremia/hyperthyroidism-primary care manages labs and meds Osteoporosis history of Fosamax for 5 years  Plan: Diflucan 150 by mouth 1 dose prescription, proper use given and reviewed. Will try estradiol vaginal cream 0.02% apply small amount 3 times weekly, encouraged to continue vaginal lubricants. SBE's, continue annual screening mammogram. Continue healthy lifestyle of regular exercise greater than one hour most days and works with a Clinical research associate. Home safety, fall prevention discussed. Repeat DEXA 2018. UAHuel Cote WHNP, 12:00 PM 01/26/2016

## 2016-01-26 NOTE — Patient Instructions (Addendum)
Menopause is a normal process in which your reproductive ability comes to an end. This process happens gradually over a span of months to years, usually between the ages of 48 and 55. Menopause is complete when you have missed 12 consecutive menstrual periods. It is important to talk with your health care provider about some of the most common conditions that affect postmenopausal women, such as heart disease, cancer, and bone loss (osteoporosis). Adopting a healthy lifestyle and getting preventive care can help to promote your health and wellness. Those actions can also lower your chances of developing some of these common conditions. WHAT SHOULD I KNOW ABOUT MENOPAUSE? During menopause, you may experience a number of symptoms, such as:  Moderate-to-severe hot flashes.  Night sweats.  Decrease in sex drive.  Mood swings.  Headaches.  Tiredness.  Irritability.  Memory problems.  Insomnia. Choosing to treat or not to treat menopausal changes is an individual decision that you make with your health care provider. WHAT SHOULD I KNOW ABOUT HORMONE REPLACEMENT THERAPY AND SUPPLEMENTS? Hormone therapy products are effective for treating symptoms that are associated with menopause, such as hot flashes and night sweats. Hormone replacement carries certain risks, especially as you become older. If you are thinking about using estrogen or estrogen with progestin treatments, discuss the benefits and risks with your health care provider. WHAT SHOULD I KNOW ABOUT HEART DISEASE AND STROKE? Heart disease, heart attack, and stroke become more likely as you age. This may be due, in part, to the hormonal changes that your body experiences during menopause. These can affect how your body processes dietary fats, triglycerides, and cholesterol. Heart attack and stroke are both medical emergencies. There are many things that you can do to help prevent heart disease and stroke:  Have your blood pressure  checked at least every 1-2 years. High blood pressure causes heart disease and increases the risk of stroke.  If you are 55-79 years old, ask your health care provider if you should take aspirin to prevent a heart attack or a stroke.  Do not use any tobacco products, including cigarettes, chewing tobacco, or electronic cigarettes. If you need help quitting, ask your health care provider.  It is important to eat a healthy diet and maintain a healthy weight.  Be sure to include plenty of vegetables, fruits, low-fat dairy products, and lean protein.  Avoid eating foods that are high in solid fats, added sugars, or salt (sodium).  Get regular exercise. This is one of the most important things that you can do for your health.  Try to exercise for at least 150 minutes each week. The type of exercise that you do should increase your heart rate and make you sweat. This is known as moderate-intensity exercise.  Try to do strengthening exercises at least twice each week. Do these in addition to the moderate-intensity exercise.  Know your numbers.Ask your health care provider to check your cholesterol and your blood glucose. Continue to have your blood tested as directed by your health care provider. WHAT SHOULD I KNOW ABOUT CANCER SCREENING? There are several types of cancer. Take the following steps to reduce your risk and to catch any cancer development as early as possible. Breast Cancer  Practice breast self-awareness.  This means understanding how your breasts normally appear and feel.  It also means doing regular breast self-exams. Let your health care provider know about any changes, no matter how small.  If you are 40 or older, have a clinician do a   breast exam (clinical breast exam or CBE) every year. Depending on your age, family history, and medical history, it may be recommended that you also have a yearly breast X-ray (mammogram).  If you have a family history of breast cancer,  talk with your health care provider about genetic screening.  If you are at high risk for breast cancer, talk with your health care provider about having an MRI and a mammogram every year.  Breast cancer (BRCA) gene test is recommended for women who have family members with BRCA-related cancers. Results of the assessment will determine the need for genetic counseling and BRCA1 and for BRCA2 testing. BRCA-related cancers include these types:  Breast. This occurs in males or females.  Ovarian.  Tubal. This may also be called fallopian tube cancer.  Cancer of the abdominal or pelvic lining (peritoneal cancer).  Prostate.  Pancreatic. Cervical, Uterine, and Ovarian Cancer Your health care provider may recommend that you be screened regularly for cancer of the pelvic organs. These include your ovaries, uterus, and vagina. This screening involves a pelvic exam, which includes checking for microscopic changes to the surface of your cervix (Pap test).  For women ages 21-65, health care providers may recommend a pelvic exam and a Pap test every three years. For women ages 77-65, they may recommend the Pap test and pelvic exam, combined with testing for human papilloma virus (HPV), every five years. Some types of HPV increase your risk of cervical cancer. Testing for HPV may also be done on women of any age who have unclear Pap test results.  Other health care providers may not recommend any screening for nonpregnant women who are considered low risk for pelvic cancer and have no symptoms. Ask your health care provider if a screening pelvic exam is right for you.  If you have had past treatment for cervical cancer or a condition that could lead to cancer, you need Pap tests and screening for cancer for at least 20 years after your treatment. If Pap tests have been discontinued for you, your risk factors (such as having a new sexual partner) need to be reassessed to determine if you should start having  screenings again. Some women have medical problems that increase the chance of getting cervical cancer. In these cases, your health care provider may recommend that you have screening and Pap tests more often.  If you have a family history of uterine cancer or ovarian cancer, talk with your health care provider about genetic screening.  If you have vaginal bleeding after reaching menopause, tell your health care provider.  There are currently no reliable tests available to screen for ovarian cancer. Lung Cancer Lung cancer screening is recommended for adults 3-70 years old who are at high risk for lung cancer because of a history of smoking. A yearly low-dose CT scan of the lungs is recommended if you:  Currently smoke.  Have a history of at least 30 pack-years of smoking and you currently smoke or have quit within the past 15 years. A pack-year is smoking an average of one pack of cigarettes per day for one year. Yearly screening should:  Continue until it has been 15 years since you quit.  Stop if you develop a health problem that would prevent you from having lung cancer treatment. Colorectal Cancer  This type of cancer can be detected and can often be prevented.  Routine colorectal cancer screening usually begins at age 38 and continues through age 12.  If you have  risk factors for colon cancer, your health care provider may recommend that you be screened at an earlier age.  If you have a family history of colorectal cancer, talk with your health care provider about genetic screening.  Your health care provider may also recommend using home test kits to check for hidden blood in your stool.  A small camera at the end of a tube can be used to examine your colon directly (sigmoidoscopy or colonoscopy). This is done to check for the earliest forms of colorectal cancer.  Direct examination of the colon should be repeated every 5-10 years until age 67. However, if early forms of  precancerous polyps or small growths are found or if you have a family history or genetic risk for colorectal cancer, you may need to be screened more often. Skin Cancer  Check your skin from head to toe regularly.  Monitor any moles. Be sure to tell your health care provider:  About any new moles or changes in moles, especially if there is a change in a mole's shape or color.  If you have a mole that is larger than the size of a pencil eraser.  If any of your family members has a history of skin cancer, especially at a young age, talk with your health care provider about genetic screening.  Always use sunscreen. Apply sunscreen liberally and repeatedly throughout the day.  Whenever you are outside, protect yourself by wearing long sleeves, pants, a wide-brimmed hat, and sunglasses. WHAT SHOULD I KNOW ABOUT OSTEOPOROSIS? Osteoporosis is a condition in which bone destruction happens more quickly than new bone creation. After menopause, you may be at an increased risk for osteoporosis. To help prevent osteoporosis or the bone fractures that can happen because of osteoporosis, the following is recommended:  If you are 39-61 years old, get at least 1,000 mg of calcium and at least 600 mg of vitamin D per day.  If you are older than age 16 but younger than age 7, get at least 1,200 mg of calcium and at least 600 mg of vitamin D per day.  If you are older than age 47, get at least 1,200 mg of calcium and at least 800 mg of vitamin D per day. Smoking and excessive alcohol intake increase the risk of osteoporosis. Eat foods that are rich in calcium and vitamin D, and do weight-bearing exercises several times each week as directed by your health care provider. WHAT SHOULD I KNOW ABOUT HOW MENOPAUSE AFFECTS [place]? Depression may occur at any age, but it is more common as you become older. Common symptoms of depression include:  Low or sad mood.  Changes in sleep patterns.  Changes  in appetite or eating patterns.  Feeling an overall lack of motivation or enjoyment of activities that you previously enjoyed.  Frequent crying spells. Talk with your health care provider if you think that you are experiencing depression. WHAT SHOULD I KNOW ABOUT IMMUNIZATIONS? It is important that you get and maintain your immunizations. These include:  Tetanus, diphtheria, and pertussis (Tdap) booster vaccine.  Influenza every year before the flu season begins.  Pneumonia vaccine.  Shingles vaccine. Your health care provider may also recommend other immunizations.   This information is not intended to replace advice given to you by your health care provider. Make sure you discuss any questions you have with your health care provider.   Document Released: 01/27/2006 Document Revised: 12/26/2014 Document Reviewed: 08/07/2014 Elsevier Interactive Patient Education 2016 [employer] Monilial Vaginitis Vaginitis in a soreness, swelling and redness (inflammation) of the vagina and vulva. Monilial vaginitis is not a sexually transmitted infection. CAUSES  Yeast vaginitis is caused by yeast (candida) that is normally found in your vagina. With a yeast infection, the candida has overgrown in number to a point that upsets the chemical balance. SYMPTOMS   White, thick vaginal discharge.  Swelling, itching, redness and irritation of the vagina and possibly the lips of the vagina (vulva).  Burning or painful urination.  Painful intercourse. DIAGNOSIS  Things that may contribute to monilial vaginitis are:  Postmenopausal and virginal states.  Pregnancy.  Infections.  Being tired, sick or stressed, especially if you had monilial vaginitis in the past.  Diabetes. Good control will help lower the chance.  Birth control pills.  Tight fitting garments.  Using bubble bath, feminine sprays, douches or deodorant tampons.  Taking certain medications that kill germs  (antibiotics).  Sporadic recurrence can occur if you become ill. TREATMENT  Your caregiver will give you medication.  There are several kinds of anti monilial vaginal creams and suppositories specific for monilial vaginitis. For recurrent yeast infections, use a suppository or cream in the vagina 2 times a week, or as directed.  Anti-monilial or steroid cream for the itching or irritation of the vulva may also be used. Get your caregiver's permission.  Painting the vagina with methylene blue solution may help if the monilial cream does not work.  Eating yogurt may help prevent monilial vaginitis. HOME CARE INSTRUCTIONS   Finish all medication as prescribed.  Do not have sex until treatment is completed or after your caregiver tells you it is okay.  Take warm sitz baths.  Do not douche.  Do not use tampons, especially scented ones.  Wear cotton underwear.  Avoid tight pants and panty hose.  Tell your sexual partner that you have a yeast infection. They should go to their caregiver if they have symptoms such as mild rash or itching.  Your sexual partner should be treated as well if your infection is difficult to eliminate.  Practice safer sex. Use condoms.  Some vaginal medications cause latex condoms to fail. Vaginal medications that harm condoms are:  Cleocin cream.  Butoconazole (Femstat).  Terconazole (Terazol) vaginal suppository.  Miconazole (Monistat) (may be purchased over the counter). SEEK MEDICAL CARE IF:   You have a temperature by mouth above 102 F (38.9 C).  The infection is getting worse after 2 days of treatment.  The infection is not getting better after 3 days of treatment.  You develop blisters in or around your vagina.  You develop vaginal bleeding, and it is not your menstrual period.  You have pain when you urinate.  You develop intestinal problems.  You have pain with sexual intercourse.   This information is not intended to  replace advice given to you by your health care provider. Make sure you discuss any questions you have with your health care provider.   Document Released: 09/14/2005 Document Revised: 02/27/2012 Document Reviewed: 06/08/2015 Elsevier Interactive Patient Education [employer].

## 2016-01-26 NOTE — Telephone Encounter (Signed)
Left msg on triage requesting status on Synthroid refill that was sent 01/24/16. Per chart no electronic refill has been sent will send refill...Dawn Tucker

## 2016-02-01 ENCOUNTER — Encounter: Payer: Self-pay | Admitting: Women's Health

## 2016-02-01 ENCOUNTER — Other Ambulatory Visit: Payer: Self-pay | Admitting: Women's Health

## 2016-02-01 DIAGNOSIS — B373 Candidiasis of vulva and vagina: Secondary | ICD-10-CM

## 2016-02-01 DIAGNOSIS — B3731 Acute candidiasis of vulva and vagina: Secondary | ICD-10-CM

## 2016-02-01 MED ORDER — FLUCONAZOLE 150 MG PO TABS: 150.0000 mg | ORAL_TABLET | Freq: Once | ORAL | Status: DC

## 2016-02-02 ENCOUNTER — Other Ambulatory Visit: Payer: Self-pay | Admitting: Gynecology

## 2016-02-08 ENCOUNTER — Other Ambulatory Visit: Payer: Self-pay

## 2016-02-08 DIAGNOSIS — Z1231 Encounter for screening mammogram for malignant neoplasm of breast: Secondary | ICD-10-CM

## 2016-03-09 ENCOUNTER — Encounter: Payer: Self-pay | Admitting: Women's Health

## 2016-03-18 ENCOUNTER — Ambulatory Visit: Payer: Medicare Other

## 2016-03-31 ENCOUNTER — Ambulatory Visit
Admission: RE | Admit: 2016-03-31 | Discharge: 2016-03-31 | Disposition: A | Discharge status: Home / Self Care | Payer: Medicare Other | Source: Ambulatory Visit

## 2016-03-31 DIAGNOSIS — Z1231 Encounter for screening mammogram for malignant neoplasm of breast: Secondary | ICD-10-CM

## 2016-04-18 DIAGNOSIS — H2513 Age-related nuclear cataract, bilateral: Secondary | ICD-10-CM | POA: Diagnosis not present

## 2016-06-07 DIAGNOSIS — S93601A Unspecified sprain of right foot, initial encounter: Secondary | ICD-10-CM | POA: Diagnosis not present

## 2016-06-22 DIAGNOSIS — B009 Herpesviral infection, unspecified: Secondary | ICD-10-CM | POA: Diagnosis not present

## 2016-06-22 DIAGNOSIS — D225 Melanocytic nevi of trunk: Secondary | ICD-10-CM | POA: Diagnosis not present

## 2016-06-22 DIAGNOSIS — L821 Other seborrheic keratosis: Secondary | ICD-10-CM | POA: Diagnosis not present

## 2016-08-01 DIAGNOSIS — S93601D Unspecified sprain of right foot, subsequent encounter: Secondary | ICD-10-CM | POA: Diagnosis not present

## 2016-08-02 DIAGNOSIS — M25571 Pain in right ankle and joints of right foot: Secondary | ICD-10-CM | POA: Diagnosis not present

## 2016-08-02 DIAGNOSIS — R531 Weakness: Secondary | ICD-10-CM | POA: Diagnosis not present

## 2016-08-02 DIAGNOSIS — S93491D Sprain of other ligament of right ankle, subsequent encounter: Secondary | ICD-10-CM | POA: Diagnosis not present

## 2016-08-04 DIAGNOSIS — R531 Weakness: Secondary | ICD-10-CM | POA: Diagnosis not present

## 2016-08-04 DIAGNOSIS — M25571 Pain in right ankle and joints of right foot: Secondary | ICD-10-CM | POA: Diagnosis not present

## 2016-08-04 DIAGNOSIS — S93491D Sprain of other ligament of right ankle, subsequent encounter: Secondary | ICD-10-CM | POA: Diagnosis not present

## 2016-08-09 DIAGNOSIS — S93491D Sprain of other ligament of right ankle, subsequent encounter: Secondary | ICD-10-CM | POA: Diagnosis not present

## 2016-08-09 DIAGNOSIS — M25571 Pain in right ankle and joints of right foot: Secondary | ICD-10-CM | POA: Diagnosis not present

## 2016-08-09 DIAGNOSIS — R531 Weakness: Secondary | ICD-10-CM | POA: Diagnosis not present

## 2016-08-11 DIAGNOSIS — S93491D Sprain of other ligament of right ankle, subsequent encounter: Secondary | ICD-10-CM | POA: Diagnosis not present

## 2016-08-11 DIAGNOSIS — M25571 Pain in right ankle and joints of right foot: Secondary | ICD-10-CM | POA: Diagnosis not present

## 2016-08-11 DIAGNOSIS — R531 Weakness: Secondary | ICD-10-CM | POA: Diagnosis not present

## 2016-08-15 DIAGNOSIS — R531 Weakness: Secondary | ICD-10-CM | POA: Diagnosis not present

## 2016-08-15 DIAGNOSIS — S93491D Sprain of other ligament of right ankle, subsequent encounter: Secondary | ICD-10-CM | POA: Diagnosis not present

## 2016-08-15 DIAGNOSIS — M25571 Pain in right ankle and joints of right foot: Secondary | ICD-10-CM | POA: Diagnosis not present

## 2016-08-19 DIAGNOSIS — M25571 Pain in right ankle and joints of right foot: Secondary | ICD-10-CM | POA: Diagnosis not present

## 2016-08-19 DIAGNOSIS — R531 Weakness: Secondary | ICD-10-CM | POA: Diagnosis not present

## 2016-08-19 DIAGNOSIS — S93491D Sprain of other ligament of right ankle, subsequent encounter: Secondary | ICD-10-CM | POA: Diagnosis not present

## 2016-08-24 DIAGNOSIS — S93491D Sprain of other ligament of right ankle, subsequent encounter: Secondary | ICD-10-CM | POA: Diagnosis not present

## 2016-08-24 DIAGNOSIS — M25571 Pain in right ankle and joints of right foot: Secondary | ICD-10-CM | POA: Diagnosis not present

## 2016-08-24 DIAGNOSIS — R531 Weakness: Secondary | ICD-10-CM | POA: Diagnosis not present

## 2016-08-26 DIAGNOSIS — S93491D Sprain of other ligament of right ankle, subsequent encounter: Secondary | ICD-10-CM | POA: Diagnosis not present

## 2016-08-26 DIAGNOSIS — R531 Weakness: Secondary | ICD-10-CM | POA: Diagnosis not present

## 2016-08-26 DIAGNOSIS — M25571 Pain in right ankle and joints of right foot: Secondary | ICD-10-CM | POA: Diagnosis not present

## 2016-08-29 DIAGNOSIS — R531 Weakness: Secondary | ICD-10-CM | POA: Diagnosis not present

## 2016-08-29 DIAGNOSIS — M25571 Pain in right ankle and joints of right foot: Secondary | ICD-10-CM | POA: Diagnosis not present

## 2016-08-29 DIAGNOSIS — S93491D Sprain of other ligament of right ankle, subsequent encounter: Secondary | ICD-10-CM | POA: Diagnosis not present

## 2016-08-31 DIAGNOSIS — S93491D Sprain of other ligament of right ankle, subsequent encounter: Secondary | ICD-10-CM | POA: Diagnosis not present

## 2016-08-31 DIAGNOSIS — M25571 Pain in right ankle and joints of right foot: Secondary | ICD-10-CM | POA: Diagnosis not present

## 2016-08-31 DIAGNOSIS — R531 Weakness: Secondary | ICD-10-CM | POA: Diagnosis not present

## 2016-09-05 DIAGNOSIS — M25571 Pain in right ankle and joints of right foot: Secondary | ICD-10-CM | POA: Diagnosis not present

## 2016-09-05 DIAGNOSIS — S93491D Sprain of other ligament of right ankle, subsequent encounter: Secondary | ICD-10-CM | POA: Diagnosis not present

## 2016-09-05 DIAGNOSIS — R531 Weakness: Secondary | ICD-10-CM | POA: Diagnosis not present

## 2016-09-12 DIAGNOSIS — M25571 Pain in right ankle and joints of right foot: Secondary | ICD-10-CM | POA: Diagnosis not present

## 2016-09-12 DIAGNOSIS — R531 Weakness: Secondary | ICD-10-CM | POA: Diagnosis not present

## 2016-09-12 DIAGNOSIS — S93491D Sprain of other ligament of right ankle, subsequent encounter: Secondary | ICD-10-CM | POA: Diagnosis not present

## 2016-09-13 DIAGNOSIS — S93601D Unspecified sprain of right foot, subsequent encounter: Secondary | ICD-10-CM | POA: Diagnosis not present

## 2016-09-14 DIAGNOSIS — T300 Burn of unspecified body region, unspecified degree: Secondary | ICD-10-CM | POA: Diagnosis not present

## 2016-09-14 DIAGNOSIS — T2010XA Burn of first degree of head, face, and neck, unspecified site, initial encounter: Secondary | ICD-10-CM | POA: Diagnosis not present

## 2016-09-15 DIAGNOSIS — M25571 Pain in right ankle and joints of right foot: Secondary | ICD-10-CM | POA: Diagnosis not present

## 2016-09-15 DIAGNOSIS — S93491D Sprain of other ligament of right ankle, subsequent encounter: Secondary | ICD-10-CM | POA: Diagnosis not present

## 2016-09-15 DIAGNOSIS — R531 Weakness: Secondary | ICD-10-CM | POA: Diagnosis not present

## 2016-09-19 DIAGNOSIS — S93491D Sprain of other ligament of right ankle, subsequent encounter: Secondary | ICD-10-CM | POA: Diagnosis not present

## 2016-09-19 DIAGNOSIS — M25571 Pain in right ankle and joints of right foot: Secondary | ICD-10-CM | POA: Diagnosis not present

## 2016-09-19 DIAGNOSIS — R531 Weakness: Secondary | ICD-10-CM | POA: Diagnosis not present

## 2016-09-22 DIAGNOSIS — S93491D Sprain of other ligament of right ankle, subsequent encounter: Secondary | ICD-10-CM | POA: Diagnosis not present

## 2016-09-22 DIAGNOSIS — R531 Weakness: Secondary | ICD-10-CM | POA: Diagnosis not present

## 2016-09-22 DIAGNOSIS — M25571 Pain in right ankle and joints of right foot: Secondary | ICD-10-CM | POA: Diagnosis not present

## 2016-09-26 DIAGNOSIS — M25571 Pain in right ankle and joints of right foot: Secondary | ICD-10-CM | POA: Diagnosis not present

## 2016-09-26 DIAGNOSIS — S93491D Sprain of other ligament of right ankle, subsequent encounter: Secondary | ICD-10-CM | POA: Diagnosis not present

## 2016-09-26 DIAGNOSIS — R531 Weakness: Secondary | ICD-10-CM | POA: Diagnosis not present

## 2016-09-29 DIAGNOSIS — R531 Weakness: Secondary | ICD-10-CM | POA: Diagnosis not present

## 2016-09-29 DIAGNOSIS — M25571 Pain in right ankle and joints of right foot: Secondary | ICD-10-CM | POA: Diagnosis not present

## 2016-09-29 DIAGNOSIS — S93491D Sprain of other ligament of right ankle, subsequent encounter: Secondary | ICD-10-CM | POA: Diagnosis not present

## 2016-10-04 DIAGNOSIS — L309 Dermatitis, unspecified: Secondary | ICD-10-CM | POA: Diagnosis not present

## 2016-10-04 DIAGNOSIS — T2010XA Burn of first degree of head, face, and neck, unspecified site, initial encounter: Secondary | ICD-10-CM | POA: Diagnosis not present

## 2016-10-04 DIAGNOSIS — Z23 Encounter for immunization: Secondary | ICD-10-CM | POA: Diagnosis not present

## 2016-10-14 ENCOUNTER — Encounter: Payer: Medicare Other | Admitting: Internal Medicine

## 2016-10-17 ENCOUNTER — Encounter: Payer: Self-pay | Admitting: Internal Medicine

## 2016-10-17 ENCOUNTER — Ambulatory Visit (INDEPENDENT_AMBULATORY_CARE_PROVIDER_SITE_OTHER): Payer: Medicare Other | Admitting: Internal Medicine

## 2016-10-17 VITALS — BP 116/80 | HR 75 | Ht 63.0 in | Wt 110.0 lb

## 2016-10-17 DIAGNOSIS — I10 Essential (primary) hypertension: Secondary | ICD-10-CM

## 2016-10-17 DIAGNOSIS — F4323 Adjustment disorder with mixed anxiety and depressed mood: Secondary | ICD-10-CM | POA: Diagnosis not present

## 2016-10-17 DIAGNOSIS — E782 Mixed hyperlipidemia: Secondary | ICD-10-CM

## 2016-10-17 DIAGNOSIS — Z0000 Encounter for general adult medical examination without abnormal findings: Secondary | ICD-10-CM

## 2016-10-17 MED ORDER — DEXLANSOPRAZOLE 60 MG PO CPDR: DELAYED_RELEASE_CAPSULE | ORAL | 3 refills | Status: DC

## 2016-10-17 MED ORDER — LOSARTAN POTASSIUM 50 MG PO TABS: 50.0000 mg | ORAL_TABLET | Freq: Every day | ORAL | 3 refills | Status: DC

## 2016-10-17 MED ORDER — ESCITALOPRAM OXALATE 5 MG PO TABS: 5.0000 mg | ORAL_TABLET | Freq: Every day | ORAL | 1 refills | Status: DC

## 2016-10-17 MED ORDER — ATORVASTATIN CALCIUM 10 MG PO TABS: 10.0000 mg | ORAL_TABLET | Freq: Every day | ORAL | 3 refills | Status: DC

## 2016-10-17 MED ORDER — ALPRAZOLAM 0.25 MG PO TABS: 0.2500 mg | ORAL_TABLET | Freq: Two times a day (BID) | ORAL | 2 refills | Status: DC | PRN

## 2016-10-17 MED ORDER — ZOLPIDEM TARTRATE 5 MG PO TABS: 5.0000 mg | ORAL_TABLET | Freq: Every evening | ORAL | 3 refills | Status: DC | PRN

## 2016-10-17 NOTE — Patient Instructions (Signed)
Preventive Care for Adults, Female A healthy lifestyle and preventive care can promote health and wellness. Preventive health guidelines for women include the following key practices.  A routine yearly physical is a good way to check with your health care provider about your health and preventive screening. It is a chance to share any concerns and updates on your health and to receive a thorough exam.  Visit your dentist for a routine exam and preventive care every 6 months. Brush your teeth twice a day and floss once a day. Good oral hygiene prevents tooth decay and gum disease.  The frequency of eye exams is based on your age, health, family medical history, use of contact lenses, and other factors. Follow your health care provider's recommendations for frequency of eye exams.  Eat a healthy diet. Foods like vegetables, fruits, whole grains, low-fat dairy products, and lean protein foods contain the nutrients you need without too many calories. Decrease your intake of foods high in solid fats, added sugars, and salt. Eat the right amount of calories for you.Get information about a proper diet from your health care provider, if necessary.  Regular physical exercise is one of the most important things you can do for your health. Most adults should get at least 150 minutes of moderate-intensity exercise (any activity that increases your heart rate and causes you to sweat) each week. In addition, most adults need muscle-strengthening exercises on 2 or more days a week.  Maintain a healthy weight. The body mass index (BMI) is a screening tool to identify possible weight problems. It provides an estimate of body fat based on height and weight. Your health care provider can find your BMI and can help you achieve or maintain a healthy weight.For adults 20 years and older:  A BMI below 18.5 is considered underweight.  A BMI of 18.5 to 24.9 is normal.  A BMI of 25 to 29.9 is considered overweight.  A  BMI of 30 and above is considered obese.  Maintain normal blood lipids and cholesterol levels by exercising and minimizing your intake of saturated fat. Eat a balanced diet with plenty of fruit and vegetables. Blood tests for lipids and cholesterol should begin at age 45 and be repeated every 5 years. If your lipid or cholesterol levels are high, you are over 50, or you are at high risk for heart disease, you may need your cholesterol levels checked more frequently.Ongoing high lipid and cholesterol levels should be treated with medicines if diet and exercise are not working.  If you smoke, find out from your health care provider how to quit. If you do not use tobacco, do not start.  Lung cancer screening is recommended for adults aged 45-80 years who are at high risk for developing lung cancer because of a history of smoking. A yearly low-dose CT scan of the lungs is recommended for people who have at least a 30-pack-year history of smoking and are a current smoker or have quit within the past 15 years. A pack year of smoking is smoking an average of 1 pack of cigarettes a day for 1 year (for example: 1 pack a day for 30 years or 2 packs a day for 15 years). Yearly screening should continue until the smoker has stopped smoking for at least 15 years. Yearly screening should be stopped for people who develop a health problem that would prevent them from having lung cancer treatment.  If you are pregnant, do not drink alcohol. If you are  breastfeeding, be very cautious about drinking alcohol. If you are not pregnant and choose to drink alcohol, do not have more than 1 drink per day. One drink is considered to be 12 ounces (355 mL) of beer, 5 ounces (148 mL) of wine, or 1.5 ounces (44 mL) of liquor.  Avoid use of street drugs. Do not share needles with anyone. Ask for help if you need support or instructions about stopping the use of drugs.  High blood pressure causes heart disease and increases the risk  of stroke. Your blood pressure should be checked at least every 1 to 2 years. Ongoing high blood pressure should be treated with medicines if weight loss and exercise do not work.  If you are 55-79 years old, ask your health care provider if you should take aspirin to prevent strokes.  Diabetes screening is done by taking a blood sample to check your blood glucose level after you have not eaten for a certain period of time (fasting). If you are not overweight and you do not have risk factors for diabetes, you should be screened once every 3 years starting at age 45. If you are overweight or obese and you are 40-70 years of age, you should be screened for diabetes every year as part of your cardiovascular risk assessment.  Breast cancer screening is essential preventive care for women. You should practice "breast self-awareness." This means understanding the normal appearance and feel of your breasts and may include breast self-examination. Any changes detected, no matter how small, should be reported to a health care provider. Women in their 20s and 30s should have a clinical breast exam (CBE) by a health care provider as part of a regular health exam every 1 to 3 years. After age 40, women should have a CBE every year. Starting at age 40, women should consider having a mammogram (breast X-ray test) every year. Women who have a family history of breast cancer should talk to their health care provider about genetic screening. Women at a high risk of breast cancer should talk to their health care providers about having an MRI and a mammogram every year.  Breast cancer gene (BRCA)-related cancer risk assessment is recommended for women who have family members with BRCA-related cancers. BRCA-related cancers include breast, ovarian, tubal, and peritoneal cancers. Having family members with these cancers may be associated with an increased risk for harmful changes (mutations) in the breast cancer genes BRCA1 and  BRCA2. Results of the assessment will determine the need for genetic counseling and BRCA1 and BRCA2 testing.  Your health care provider may recommend that you be screened regularly for cancer of the pelvic organs (ovaries, uterus, and vagina). This screening involves a pelvic examination, including checking for microscopic changes to the surface of your cervix (Pap test). You may be encouraged to have this screening done every 3 years, beginning at age 21.  For women ages 30-65, health care providers may recommend pelvic exams and Pap testing every 3 years, or they may recommend the Pap and pelvic exam, combined with testing for human papilloma virus (HPV), every 5 years. Some types of HPV increase your risk of cervical cancer. Testing for HPV may also be done on women of any age with unclear Pap test results.  Other health care providers may not recommend any screening for nonpregnant women who are considered low risk for pelvic cancer and who do not have symptoms. Ask your health care provider if a screening pelvic exam is right for   you.  If you have had past treatment for cervical cancer or a condition that could lead to cancer, you need Pap tests and screening for cancer for at least 20 years after your treatment. If Pap tests have been discontinued, your risk factors (such as having a new sexual partner) need to be reassessed to determine if screening should resume. Some women have medical problems that increase the chance of getting cervical cancer. In these cases, your health care provider may recommend more frequent screening and Pap tests.  Colorectal cancer can be detected and often prevented. Most routine colorectal cancer screening begins at the age of 50 years and continues through age 75 years. However, your health care provider may recommend screening at an earlier age if you have risk factors for colon cancer. On a yearly basis, your health care provider may provide home test kits to check  for hidden blood in the stool. Use of a small camera at the end of a tube, to directly examine the colon (sigmoidoscopy or colonoscopy), can detect the earliest forms of colorectal cancer. Talk to your health care provider about this at age 50, when routine screening begins. Direct exam of the colon should be repeated every 5-10 years through age 75 years, unless early forms of precancerous polyps or small growths are found.  People who are at an increased risk for hepatitis B should be screened for this virus. You are considered at high risk for hepatitis B if:  You were born in a country where hepatitis B occurs often. Talk with your health care provider about which countries are considered high risk.  Your parents were born in a high-risk country and you have not received a shot to protect against hepatitis B (hepatitis B vaccine).  You have HIV or AIDS.  You use needles to inject street drugs.  You live with, or have sex with, someone who has hepatitis B.  You get hemodialysis treatment.  You take certain medicines for conditions like cancer, organ transplantation, and autoimmune conditions.  Hepatitis C blood testing is recommended for all people born from 1945 through 1965 and any individual with known risks for hepatitis C.  Practice safe sex. Use condoms and avoid high-risk sexual practices to reduce the spread of sexually transmitted infections (STIs). STIs include gonorrhea, chlamydia, syphilis, trichomonas, herpes, HPV, and human immunodeficiency virus (HIV). Herpes, HIV, and HPV are viral illnesses that have no cure. They can result in disability, cancer, and death.  You should be screened for sexually transmitted illnesses (STIs) including gonorrhea and chlamydia if:  You are sexually active and are younger than 24 years.  You are older than 24 years and your health care provider tells you that you are at risk for this type of infection.  Your sexual activity has changed  since you were last screened and you are at an increased risk for chlamydia or gonorrhea. Ask your health care provider if you are at risk.  If you are at risk of being infected with HIV, it is recommended that you take a prescription medicine daily to prevent HIV infection. This is called preexposure prophylaxis (PrEP). You are considered at risk if:  You are sexually active and do not regularly use condoms or know the HIV status of your partner(s).  You take drugs by injection.  You are sexually active with a partner who has HIV.  Talk with your health care provider about whether you are at high risk of being infected with HIV. If   you choose to begin PrEP, you should first be tested for HIV. You should then be tested every 3 months for as long as you are taking PrEP.  Osteoporosis is a disease in which the bones lose minerals and strength with aging. This can result in serious bone fractures or breaks. The risk of osteoporosis can be identified using a bone density scan. Women ages 67 years and over and women at risk for fractures or osteoporosis should discuss screening with their health care providers. Ask your health care provider whether you should take a calcium supplement or vitamin D to reduce the rate of osteoporosis.  Menopause can be associated with physical symptoms and risks. Hormone replacement therapy is available to decrease symptoms and risks. You should talk to your health care provider about whether hormone replacement therapy is right for you.  Use sunscreen. Apply sunscreen liberally and repeatedly throughout the day. You should seek shade when your shadow is shorter than you. Protect yourself by wearing long sleeves, pants, a wide-brimmed hat, and sunglasses year round, whenever you are outdoors.  Once a month, do a whole body skin exam, using a mirror to look at the skin on your back. Tell your health care provider of new moles, moles that have irregular borders, moles that  are larger than a pencil eraser, or moles that have changed in shape or color.  Stay current with required vaccines (immunizations).  Influenza vaccine. All adults should be immunized every year.  Tetanus, diphtheria, and acellular pertussis (Td, Tdap) vaccine. Pregnant women should receive 1 dose of Tdap vaccine during each pregnancy. The dose should be obtained regardless of the length of time since the last dose. Immunization is preferred during the 27th-36th week of gestation. An adult who has not previously received Tdap or who does not know her vaccine status should receive 1 dose of Tdap. This initial dose should be followed by tetanus and diphtheria toxoids (Td) booster doses every 10 years. Adults with an unknown or incomplete history of completing a 3-dose immunization series with Td-containing vaccines should begin or complete a primary immunization series including a Tdap dose. Adults should receive a Td booster every 10 years.  Varicella vaccine. An adult without evidence of immunity to varicella should receive 2 doses or a second dose if she has previously received 1 dose. Pregnant females who do not have evidence of immunity should receive the first dose after pregnancy. This first dose should be obtained before leaving the health care facility. The second dose should be obtained 4-8 weeks after the first dose.  Human papillomavirus (HPV) vaccine. Females aged 13-26 years who have not received the vaccine previously should obtain the 3-dose series. The vaccine is not recommended for use in pregnant females. However, pregnancy testing is not needed before receiving a dose. If a female is found to be pregnant after receiving a dose, no treatment is needed. In that case, the remaining doses should be delayed until after the pregnancy. Immunization is recommended for any person with an immunocompromised condition through the age of 61 years if she did not get any or all doses earlier. During the  3-dose series, the second dose should be obtained 4-8 weeks after the first dose. The third dose should be obtained 24 weeks after the first dose and 16 weeks after the second dose.  Zoster vaccine. One dose is recommended for adults aged 30 years or older unless certain conditions are present.  Measles, mumps, and rubella (MMR) vaccine. Adults born  before 1957 generally are considered immune to measles and mumps. Adults born in 1957 or later should have 1 or more doses of MMR vaccine unless there is a contraindication to the vaccine or there is laboratory evidence of immunity to each of the three diseases. A routine second dose of MMR vaccine should be obtained at least 28 days after the first dose for students attending postsecondary schools, health care workers, or international travelers. People who received inactivated measles vaccine or an unknown type of measles vaccine during 1963-1967 should receive 2 doses of MMR vaccine. People who received inactivated mumps vaccine or an unknown type of mumps vaccine before 1979 and are at high risk for mumps infection should consider immunization with 2 doses of MMR vaccine. For females of childbearing age, rubella immunity should be determined. If there is no evidence of immunity, females who are not pregnant should be vaccinated. If there is no evidence of immunity, females who are pregnant should delay immunization until after pregnancy. Unvaccinated health care workers born before 1957 who lack laboratory evidence of measles, mumps, or rubella immunity or laboratory confirmation of disease should consider measles and mumps immunization with 2 doses of MMR vaccine or rubella immunization with 1 dose of MMR vaccine.  Pneumococcal 13-valent conjugate (PCV13) vaccine. When indicated, a person who is uncertain of his immunization history and has no record of immunization should receive the PCV13 vaccine. All adults 65 years of age and older should receive this  vaccine. An adult aged 19 years or older who has certain medical conditions and has not been previously immunized should receive 1 dose of PCV13 vaccine. This PCV13 should be followed with a dose of pneumococcal polysaccharide (PPSV23) vaccine. Adults who are at high risk for pneumococcal disease should obtain the PPSV23 vaccine at least 8 weeks after the dose of PCV13 vaccine. Adults older than 70 years of age who have normal immune system function should obtain the PPSV23 vaccine dose at least 1 year after the dose of PCV13 vaccine.  Pneumococcal polysaccharide (PPSV23) vaccine. When PCV13 is also indicated, PCV13 should be obtained first. All adults aged 65 years and older should be immunized. An adult younger than age 65 years who has certain medical conditions should be immunized. Any person who resides in a nursing home or long-term care facility should be immunized. An adult smoker should be immunized. People with an immunocompromised condition and certain other conditions should receive both PCV13 and PPSV23 vaccines. People with human immunodeficiency virus (HIV) infection should be immunized as soon as possible after diagnosis. Immunization during chemotherapy or radiation therapy should be avoided. Routine use of PPSV23 vaccine is not recommended for American Indians, [place] Natives, or people younger than 65 years unless there are medical conditions that require PPSV23 vaccine. When indicated, people who have unknown immunization and have no record of immunization should receive PPSV23 vaccine. One-time revaccination 5 years after the first dose of PPSV23 is recommended for people aged 19-64 years who have chronic kidney failure, nephrotic syndrome, asplenia, or immunocompromised conditions. People who received 1-2 doses of PPSV23 before age 65 years should receive another dose of PPSV23 vaccine at age 65 years or later if at least 5 years have passed since the previous dose. Doses of PPSV23 are not  needed for people immunized with PPSV23 at or after age 65 years.  Meningococcal vaccine. Adults with asplenia or persistent complement component deficiencies should receive 2 doses of quadrivalent meningococcal conjugate (MenACWY-D) vaccine. The doses should be obtained   at least 2 months apart. Microbiologists working with certain meningococcal bacteria, [place] recruits, people at risk during an outbreak, and people who travel to or live in countries with a high rate of meningitis should be immunized. A first-year college student up through age 34 years who is living in a residence hall should receive a dose if she did not receive a dose on or after her 16th birthday. Adults who have certain high-risk conditions should receive one or more doses of vaccine.  Hepatitis A vaccine. Adults who wish to be protected from this disease, have certain high-risk conditions, work with hepatitis A-infected animals, work in hepatitis A research labs, or travel to or work in countries with a high rate of hepatitis A should be immunized. Adults who were previously unvaccinated and who anticipate close contact with an international adoptee during the first 60 days after arrival in the [place] from a country with a high rate of hepatitis A should be immunized.  Hepatitis B vaccine. Adults who wish to be protected from this disease, have certain high-risk conditions, may be exposed to blood or other infectious body fluids, are household contacts or sex partners of hepatitis B positive people, are clients or workers in certain care facilities, or travel to or work in countries with a high rate of hepatitis B should be immunized.  Haemophilus influenzae type b (Hib) vaccine. A previously unvaccinated person with asplenia or sickle cell disease or having a scheduled splenectomy should receive 1 dose of Hib vaccine. Regardless of previous immunization, a recipient of a hematopoietic stem cell transplant should receive a  3-dose series 6-12 months after her successful transplant. Hib vaccine is not recommended for adults with HIV infection. Preventive Services / Frequency Ages 35 to 4 years  Blood pressure check.** / Every 3-5 years.  Lipid and cholesterol check.** / Every 5 years beginning at age 60.  Clinical breast exam.** / Every 3 years for women in their 71s and 10s.  BRCA-related cancer risk assessment.** / For women who have family members with a BRCA-related cancer (breast, ovarian, tubal, or peritoneal cancers).  Pap test.** / Every 2 years from ages 76 through 26. Every 3 years starting at age 61 through age 76 or 93 with a history of 3 consecutive normal Pap tests.  HPV screening.** / Every 3 years from ages 37 through ages 60 to 51 with a history of 3 consecutive normal Pap tests.  Hepatitis C blood test.** / For any individual with known risks for hepatitis C.  Skin self-exam. / Monthly.  Influenza vaccine. / Every year.  Tetanus, diphtheria, and acellular pertussis (Tdap, Td) vaccine.** / Consult your health care provider. Pregnant women should receive 1 dose of Tdap vaccine during each pregnancy. 1 dose of Td every 10 years.  Varicella vaccine.** / Consult your health care provider. Pregnant females who do not have evidence of immunity should receive the first dose after pregnancy.  HPV vaccine. / 3 doses over 6 months, if 93 and younger. The vaccine is not recommended for use in pregnant females. However, pregnancy testing is not needed before receiving a dose.  Measles, mumps, rubella (MMR) vaccine.** / You need at least 1 dose of MMR if you were born in 1957 or later. You may also need a 2nd dose. For females of childbearing age, rubella immunity should be determined. If there is no evidence of immunity, females who are not pregnant should be vaccinated. If there is no evidence of immunity, females who are  pregnant should delay immunization until after pregnancy.  Pneumococcal  13-valent conjugate (PCV13) vaccine.** / Consult your health care provider.  Pneumococcal polysaccharide (PPSV23) vaccine.** / 1 to 2 doses if you smoke cigarettes or if you have certain conditions.  Meningococcal vaccine.** / 1 dose if you are age 68 to 8 years and a Market researcher living in a residence hall, or have one of several medical conditions, you need to get vaccinated against meningococcal disease. You may also need additional booster doses.  Hepatitis A vaccine.** / Consult your health care provider.  Hepatitis B vaccine.** / Consult your health care provider.  Haemophilus influenzae type b (Hib) vaccine.** / Consult your health care provider. Ages 7 to 53 years  Blood pressure check.** / Every year.  Lipid and cholesterol check.** / Every 5 years beginning at age 25 years.  Lung cancer screening. / Every year if you are aged 11-80 years and have a 30-pack-year history of smoking and currently smoke or have quit within the past 15 years. Yearly screening is stopped once you have quit smoking for at least 15 years or develop a health problem that would prevent you from having lung cancer treatment.  Clinical breast exam.** / Every year after age 48 years.  BRCA-related cancer risk assessment.** / For women who have family members with a BRCA-related cancer (breast, ovarian, tubal, or peritoneal cancers).  Mammogram.** / Every year beginning at age 41 years and continuing for as long as you are in good health. Consult with your health care provider.  Pap test.** / Every 3 years starting at age 65 years through age 37 or 70 years with a history of 3 consecutive normal Pap tests.  HPV screening.** / Every 3 years from ages 72 years through ages 60 to 40 years with a history of 3 consecutive normal Pap tests.  Fecal occult blood test (FOBT) of stool. / Every year beginning at age 21 years and continuing until age 5 years. You may not need to do this test if you get  a colonoscopy every 10 years.  Flexible sigmoidoscopy or colonoscopy.** / Every 5 years for a flexible sigmoidoscopy or every 10 years for a colonoscopy beginning at age 35 years and continuing until age 48 years.  Hepatitis C blood test.** / For all people born from [birth year] through 1965 and any individual with known risks for hepatitis C.  Skin self-exam. / Monthly.  Influenza vaccine. / Every year.  Tetanus, diphtheria, and acellular pertussis (Tdap/Td) vaccine.** / Consult your health care provider. Pregnant women should receive 1 dose of Tdap vaccine during each pregnancy. 1 dose of Td every 10 years.  Varicella vaccine.** / Consult your health care provider. Pregnant females who do not have evidence of immunity should receive the first dose after pregnancy.  Zoster vaccine.** / 1 dose for adults aged 30 years or older.  Measles, mumps, rubella (MMR) vaccine.** / You need at least 1 dose of MMR if you were born in 1957 or later. You may also need a second dose. For females of childbearing age, rubella immunity should be determined. If there is no evidence of immunity, females who are not pregnant should be vaccinated. If there is no evidence of immunity, females who are pregnant should delay immunization until after pregnancy.  Pneumococcal 13-valent conjugate (PCV13) vaccine.** / Consult your health care provider.  Pneumococcal polysaccharide (PPSV23) vaccine.** / 1 to 2 doses if you smoke cigarettes or if you have certain conditions.  Meningococcal vaccine.** /  Consult your health care provider.  Hepatitis A vaccine.** / Consult your health care provider.  Hepatitis B vaccine.** / Consult your health care provider.  Haemophilus influenzae type b (Hib) vaccine.** / Consult your health care provider. Ages 64 years and over  Blood pressure check.** / Every year.  Lipid and cholesterol check.** / Every 5 years beginning at age 23 years.  Lung cancer screening. / Every year if you  are aged 16-80 years and have a 30-pack-year history of smoking and currently smoke or have quit within the past 15 years. Yearly screening is stopped once you have quit smoking for at least 15 years or develop a health problem that would prevent you from having lung cancer treatment.  Clinical breast exam.** / Every year after age 74 years.  BRCA-related cancer risk assessment.** / For women who have family members with a BRCA-related cancer (breast, ovarian, tubal, or peritoneal cancers).  Mammogram.** / Every year beginning at age 44 years and continuing for as long as you are in good health. Consult with your health care provider.  Pap test.** / Every 3 years starting at age 58 years through age 22 or 39 years with 3 consecutive normal Pap tests. Testing can be stopped between 65 and 70 years with 3 consecutive normal Pap tests and no abnormal Pap or HPV tests in the past 10 years.  HPV screening.** / Every 3 years from ages 64 years through ages 70 or 61 years with a history of 3 consecutive normal Pap tests. Testing can be stopped between 65 and 70 years with 3 consecutive normal Pap tests and no abnormal Pap or HPV tests in the past 10 years.  Fecal occult blood test (FOBT) of stool. / Every year beginning at age 40 years and continuing until age 27 years. You may not need to do this test if you get a colonoscopy every 10 years.  Flexible sigmoidoscopy or colonoscopy.** / Every 5 years for a flexible sigmoidoscopy or every 10 years for a colonoscopy beginning at age 7 years and continuing until age 32 years.  Hepatitis C blood test.** / For all people born from [birth year] through 1965 and any individual with known risks for hepatitis C.  Osteoporosis screening.** / A one-time screening for women ages 30 years and over and women at risk for fractures or osteoporosis.  Skin self-exam. / Monthly.  Influenza vaccine. / Every year.  Tetanus, diphtheria, and acellular pertussis (Tdap/Td)  vaccine.** / 1 dose of Td every 10 years.  Varicella vaccine.** / Consult your health care provider.  Zoster vaccine.** / 1 dose for adults aged 35 years or older.  Pneumococcal 13-valent conjugate (PCV13) vaccine.** / Consult your health care provider.  Pneumococcal polysaccharide (PPSV23) vaccine.** / 1 dose for all adults aged 46 years and older.  Meningococcal vaccine.** / Consult your health care provider.  Hepatitis A vaccine.** / Consult your health care provider.  Hepatitis B vaccine.** / Consult your health care provider.  Haemophilus influenzae type b (Hib) vaccine.** / Consult your health care provider. ** Family history and personal history of risk and conditions may change your health care provider's recommendations.   This information is not intended to replace advice given to you by your health care provider. Make sure you discuss any questions you have with your health care provider.   Document Released: 01/31/2002 Document Revised: 12/26/2014 Document Reviewed: 05/02/2011 Elsevier Interactive Patient Education [employer].

## 2016-10-17 NOTE — Progress Notes (Signed)
Subjective:  Patient ID: Dawn Tucker, female    DOB: January 27, 1946  Age: 70 y.o. MRN: [id number]  CC: No chief complaint on file.   HPI Dawn Tucker presents for a well exam. C/o stress and anxiety: the pt lost wt.   Outpatient Medications Prior to Visit  Medication Sig Dispense Refill  . atorvastatin (LIPITOR) 10 MG tablet Take 1 tablet (10 mg total) by mouth daily. 90 tablet 3  . Calcium-Vitamin D-Vitamin K [id number] MG-UNT-MCG CHEW Chew by mouth daily. Reported on 01/26/2016    . dexlansoprazole (DEXILANT) 60 MG capsule TAKE 1 CAPSULE BY MOUTH DAILY 90 capsule 3  . Estradiol (VAGIFEM) 10 MCG TABS vaginal tablet INSERT ONE TABLET VAGINALLY TWICE WEEKLY 8 tablet 12  . fluconazole (DIFLUCAN) 150 MG tablet Take 1 tablet (150 mg total) by mouth once. 1 tablet 1  . losartan (COZAAR) 50 MG tablet Take 1 tablet (50 mg total) by mouth daily. 90 tablet 3  . Multiple Vitamin (MULTIVITAMIN) tablet Take 1 tablet by mouth daily.      Marland Kitchen nystatin-triamcinolone ointment (MYCOLOG) Apply topically 2 (two) times daily as needed.    . ranitidine (ZANTAC) 150 MG tablet Take 150 mg by mouth as needed.      . simethicone (MYLICON) 0000000 MG chewable tablet Chew 125 mg by mouth every 6 (six) hours as needed.      Marland Kitchen SYNTHROID 88 MCG tablet Take 1 tablet by mouth daily.  3  . valACYclovir (VALTREX) 1000 MG tablet Take 1 tablet (1,000 mg total) by mouth as needed. Take 2 tabs AM, 2 tabs PM for one day for fever  blister 360 tablet 0  . YUVAFEM 10 MCG TABS vaginal tablet INSERT 1 TABLET VAGINALLY TWICE WEEKLY 8 tablet 11  . zolpidem (AMBIEN) 5 MG tablet TAKE 1 TABLET BY MOUTH AT BEDTIME AS NEEDED FOR SLEEP 30 tablet 3   No facility-administered medications prior to visit.     ROS Review of Systems  Constitutional: Negative for activity change, appetite change, chills, fatigue and unexpected weight change.  HENT: Negative for congestion, mouth sores and sinus pressure.   Eyes: Negative for visual  disturbance.  Respiratory: Negative for cough and chest tightness.   Gastrointestinal: Negative for abdominal pain and nausea.  Genitourinary: Negative for difficulty urinating, frequency and vaginal pain.  Musculoskeletal: Negative for back pain and gait problem.  Skin: Negative for pallor and rash.  Neurological: Negative for dizziness, tremors, weakness, numbness and headaches.  Psychiatric/Behavioral: Negative for confusion and sleep disturbance. The patient is nervous/anxious.     Objective:  BP 116/80   Pulse 75   Ht 5\' 3"  (1.6 m)   Wt 110 lb (49.9 kg)   SpO2 99%   BMI 19.49 kg/m   BP Readings from Last 3 Encounters:  10/17/16 116/80  01/26/16 118/80  10/14/15 128/90    Wt Readings from Last 3 Encounters:  10/17/16 110 lb (49.9 kg)  01/26/16 114 lb (51.7 kg)  10/14/15 113 lb (51.3 kg)    Physical Exam  Constitutional: She appears well-developed. No distress.  HENT:  Head: Normocephalic.  Right Ear: External ear normal.  Left Ear: External ear normal.  Nose: Nose normal.  Mouth/Throat: Oropharynx is clear and moist.  Eyes: Conjunctivae are normal. Pupils are equal, round, and reactive to light. Right eye exhibits no discharge. Left eye exhibits no discharge.  Neck: Normal range of motion. Neck supple. No JVD present. No tracheal deviation present. No thyromegaly present.  Cardiovascular: Normal  rate, regular rhythm and normal heart sounds.   Pulmonary/Chest: No stridor. No respiratory distress. She has no wheezes.  Abdominal: Soft. Bowel sounds are normal. She exhibits no distension and no mass. There is no tenderness. There is no rebound and no guarding.  Musculoskeletal: She exhibits no edema or tenderness.  Lymphadenopathy:    She has no cervical adenopathy.  Neurological: She displays normal reflexes. No cranial nerve deficit. She exhibits normal muscle tone. Coordination normal.  Skin: No rash noted. No erythema.  Psychiatric: She has a normal mood and  affect. Her behavior is normal. Judgment and thought content normal.    Lab Results  Component Value Date   WBC 7.3 12/03/2015   HGB 12.8 12/03/2015   HCT 38.6 12/03/2015   PLT 263.0 12/03/2015   GLUCOSE 102 (H) 12/03/2015   CHOL 180 12/03/2015   TRIG 130.0 12/03/2015   HDL 68.50 12/03/2015   LDLCALC 85 12/03/2015   ALT 22 12/03/2015   AST 22 12/03/2015   NA 139 12/03/2015   K 4.3 12/03/2015   CL 103 12/03/2015   CREATININE 1.02 12/03/2015   BUN 14 12/03/2015   CO2 30 12/03/2015   TSH 0.16 (L) 01/20/2016   HGBA1C 5.9 10/02/2015    Mm Screening Breast Tomo Bilateral  Result Date: 03/31/2016 CLINICAL DATA:  Screening. EXAM: 2D DIGITAL SCREENING BILATERAL MAMMOGRAM WITH CAD AND ADJUNCT TOMO COMPARISON:  Previous exam(s). ACR Breast Density Category c: The breast tissue is heterogeneously dense, which may obscure small masses. FINDINGS: There are no findings suspicious for malignancy. Images were processed with CAD. IMPRESSION: No mammographic evidence of malignancy. A result letter of this screening mammogram will be mailed directly to the patient. RECOMMENDATION: Screening mammogram in one year. (Code:SM-B-01Y) BI-RADS CATEGORY  1: Negative. Electronically Signed   By: Dorise Bullion III M.D   On: 03/31/2016 18:28    Assessment & Plan:   There are no diagnoses linked to this encounter. I am having Ms. Theisen maintain her Calcium-Vitamin D-Vitamin K, simethicone, multivitamin, ranitidine, valACYclovir, nystatin-triamcinolone ointment, atorvastatin, dexlansoprazole, losartan, zolpidem, SYNTHROID, Estradiol, fluconazole, and YUVAFEM.  No orders of the defined types were placed in this encounter.    Follow-up: No Follow-up on file.  Walker Kehr, MD

## 2016-10-17 NOTE — Progress Notes (Signed)
Pre visit review using our clinic review tool, if applicable. No additional management support is needed unless otherwise documented below in the visit note. 

## 2016-10-17 NOTE — Assessment & Plan Note (Signed)
On Losartan Labs

## 2016-10-17 NOTE — Assessment & Plan Note (Signed)
Here for medicare wellness/physical  Diet: heart healthy  Physical activity: not sedentary  Depression/mood screen: depressed Hearing: intact to whispered voice  Visual acuity: grossly normal, performs annual eye exam  ADLs: capable  Fall risk: low to none  Home safety: good  Cognitive evaluation: intact to orientation, naming, recall and repetition  EOL planning: adv directives, full code/ I agree  I have personally reviewed and have noted  1. The patient's medical, surgical and social history  2. Their use of alcohol, tobacco or illicit drugs  3. Their current medications and supplements  4. The patient's functional ability including ADL's, fall risks, home safety risks and hearing or visual impairment.  5. Diet and physical activities  6. Evidence for depression or mood disorders 7. The roster of all physicians providing medical care to patient - is listed in the Snapshot section of the chart and reviewed today.    Today patient counseled on age appropriate routine health concerns for screening and prevention, each reviewed and up to date or declined. Immunizations reviewed and up to date or declined. Labs ordered and reviewed. Risk factors for depression reviewed and negative. Hearing function and visual acuity are intact. ADLs screened and addressed as needed. Functional ability and level of safety reviewed and appropriate. Education, counseling and referrals performed based on assessed risks today. Patient provided with a copy of personalized plan for preventive services.     

## 2016-10-17 NOTE — Assessment & Plan Note (Signed)
On Lipitor 

## 2016-10-18 DIAGNOSIS — F4323 Adjustment disorder with mixed anxiety and depressed mood: Secondary | ICD-10-CM | POA: Insufficient documentation

## 2016-10-18 NOTE — Assessment & Plan Note (Signed)
10/17: stress at home - wt loss Xanax prn Zolpidem prn  Potential benefits of a long term benzodiazepines  use as well as potential risks  and complications were explained to the patient and were aknowledged.

## 2016-10-19 ENCOUNTER — Other Ambulatory Visit (INDEPENDENT_AMBULATORY_CARE_PROVIDER_SITE_OTHER): Payer: Medicare Other

## 2016-10-19 DIAGNOSIS — Z0000 Encounter for general adult medical examination without abnormal findings: Secondary | ICD-10-CM | POA: Diagnosis not present

## 2016-10-19 DIAGNOSIS — E782 Mixed hyperlipidemia: Secondary | ICD-10-CM | POA: Diagnosis not present

## 2016-10-19 DIAGNOSIS — I10 Essential (primary) hypertension: Secondary | ICD-10-CM | POA: Diagnosis not present

## 2016-10-19 LAB — URINALYSIS
Bilirubin Urine: NEGATIVE
Ketones, ur: NEGATIVE
LEUKOCYTES UA: NEGATIVE
NITRITE: NEGATIVE
PH: 6 (ref 5.0–8.0)
Total Protein, Urine: NEGATIVE
UROBILINOGEN UA: 0.2 (ref 0.0–1.0)
Urine Glucose: NEGATIVE

## 2016-10-19 LAB — BASIC METABOLIC PANEL
BUN: 14 mg/dL (ref 6–23)
CALCIUM: 9.2 mg/dL (ref 8.4–10.5)
CO2: 27 meq/L (ref 19–32)
CREATININE: 0.97 mg/dL (ref 0.40–1.20)
Chloride: 102 mEq/L (ref 96–112)
GFR: 60.34 mL/min (ref >60.00)
Glucose, Bld: 101 mg/dL — ABNORMAL HIGH (ref 70–99)
Potassium: 4 mEq/L (ref 3.5–5.1)
SODIUM: 138 meq/L (ref 135–145)

## 2016-10-19 LAB — HEPATIC FUNCTION PANEL
ALK PHOS: 64 U/L (ref 39–117)
ALT: 19 U/L (ref 0–35)
AST: 19 U/L (ref 0–37)
Albumin: 4.2 g/dL (ref 3.5–5.2)
BILIRUBIN DIRECT: 0.1 mg/dL (ref 0.0–0.3)
BILIRUBIN TOTAL: 0.8 mg/dL (ref 0.2–1.2)
Total Protein: 7.1 g/dL (ref 6.0–8.3)

## 2016-10-19 LAB — CBC WITH DIFFERENTIAL/PLATELET
BASOS ABS: 0 10*3/uL (ref 0.0–0.1)
Basophils Relative: 0.7 % (ref 0.0–3.0)
EOS ABS: 0.1 10*3/uL (ref 0.0–0.7)
Eosinophils Relative: 2.3 % (ref 0.0–5.0)
HEMATOCRIT: 37.8 % (ref 36.0–46.0)
Hemoglobin: 12.8 g/dL (ref 12.0–15.0)
LYMPHS ABS: 1.6 10*3/uL (ref 0.7–4.0)
LYMPHS PCT: 28.8 % (ref 12.0–46.0)
MCHC: 33.8 g/dL (ref 30.0–36.0)
MCV: 87.7 fl (ref 78.0–100.0)
MONOS PCT: 8.4 % (ref 3.0–12.0)
Monocytes Absolute: 0.5 10*3/uL (ref 0.1–1.0)
NEUTROS ABS: 3.4 10*3/uL (ref 1.4–7.7)
NEUTROS PCT: 59.8 % (ref 43.0–77.0)
PLATELETS: 265 10*3/uL (ref 150.0–400.0)
RBC: 4.31 Mil/uL (ref 3.87–5.11)
RDW: 13.3 % (ref 11.5–15.5)
WBC: 5.7 10*3/uL (ref 4.0–10.5)

## 2016-10-19 LAB — LIPID PANEL
Cholesterol: 200 mg/dL (ref 0–200)
HDL: 71 mg/dL (ref >39.00)
LDL Cholesterol: 107 mg/dL — ABNORMAL HIGH (ref 0–99)
NonHDL: 129.22
TRIGLYCERIDES: 109 mg/dL (ref 0.0–149.0)
Total CHOL/HDL Ratio: 3
VLDL: 21.8 mg/dL (ref 0.0–40.0)

## 2016-10-19 LAB — TSH: TSH: 0.3 u[IU]/mL — ABNORMAL LOW (ref 0.35–4.50)

## 2016-10-20 ENCOUNTER — Other Ambulatory Visit: Payer: Self-pay | Admitting: Internal Medicine

## 2016-10-20 DIAGNOSIS — R7989 Other specified abnormal findings of blood chemistry: Secondary | ICD-10-CM

## 2016-10-20 LAB — HEPATITIS C ANTIBODY: HCV AB: NEGATIVE

## 2016-11-17 DIAGNOSIS — H0011 Chalazion right upper eyelid: Secondary | ICD-10-CM | POA: Diagnosis not present

## 2016-11-22 ENCOUNTER — Other Ambulatory Visit: Payer: Self-pay | Admitting: Internal Medicine

## 2016-11-28 ENCOUNTER — Encounter: Payer: Self-pay | Admitting: Internal Medicine

## 2016-11-28 ENCOUNTER — Other Ambulatory Visit (INDEPENDENT_AMBULATORY_CARE_PROVIDER_SITE_OTHER): Payer: Medicare Other

## 2016-11-28 ENCOUNTER — Ambulatory Visit (INDEPENDENT_AMBULATORY_CARE_PROVIDER_SITE_OTHER): Payer: Medicare Other | Admitting: Internal Medicine

## 2016-11-28 DIAGNOSIS — R946 Abnormal results of thyroid function studies: Secondary | ICD-10-CM | POA: Diagnosis not present

## 2016-11-28 DIAGNOSIS — F4323 Adjustment disorder with mixed anxiety and depressed mood: Secondary | ICD-10-CM | POA: Diagnosis not present

## 2016-11-28 DIAGNOSIS — E039 Hypothyroidism, unspecified: Secondary | ICD-10-CM | POA: Diagnosis not present

## 2016-11-28 DIAGNOSIS — I10 Essential (primary) hypertension: Secondary | ICD-10-CM

## 2016-11-28 DIAGNOSIS — R7989 Other specified abnormal findings of blood chemistry: Secondary | ICD-10-CM

## 2016-11-28 LAB — T4, FREE: Free T4: 1.11 ng/dL (ref 0.60–1.60)

## 2016-11-28 LAB — TSH: TSH: 0.1 u[IU]/mL — ABNORMAL LOW (ref 0.35–4.50)

## 2016-11-28 MED ORDER — ZOLPIDEM TARTRATE ER 6.25 MG PO TBCR: 6.2500 mg | EXTENDED_RELEASE_TABLET | Freq: Every evening | ORAL | 0 refills | Status: DC | PRN

## 2016-11-28 NOTE — Progress Notes (Signed)
Pre visit review using our clinic review tool, if applicable. No additional management support is needed unless otherwise documented below in the visit note. 

## 2016-11-28 NOTE — Progress Notes (Signed)
Subjective:  Patient ID: Dawn Tucker, female    DOB: [birth year]  Age: 70 y.o. MRN: [id number]  CC: No chief complaint on file.   HPI Dawn Tucker presents for wt loss, depression, anxiety, insomnia f/u Doing a little better  Outpatient Medications Prior to Visit  Medication Sig Dispense Refill  . ALPRAZolam (XANAX) 0.25 MG tablet Take 1 tablet (0.25 mg total) by mouth 2 (two) times daily as needed for anxiety. 60 tablet 2  . atorvastatin (LIPITOR) 10 MG tablet Take 1 tablet (10 mg total) by mouth daily. 90 tablet 3  . Calcium-Vitamin D-Vitamin K [id number] MG-UNT-MCG CHEW Chew by mouth daily. Reported on 01/26/2016    . dexlansoprazole (DEXILANT) 60 MG capsule TAKE 1 CAPSULE BY MOUTH DAILY 90 capsule 3  . escitalopram (LEXAPRO) 5 MG tablet Take 1 tablet (5 mg total) by mouth daily. 90 tablet 1  . Estradiol (VAGIFEM) 10 MCG TABS vaginal tablet INSERT ONE TABLET VAGINALLY TWICE WEEKLY 8 tablet 12  . fluconazole (DIFLUCAN) 150 MG tablet Take 1 tablet (150 mg total) by mouth once. 1 tablet 1  . losartan (COZAAR) 50 MG tablet Take 1 tablet (50 mg total) by mouth daily. 90 tablet 3  . Multiple Vitamin (MULTIVITAMIN) tablet Take 1 tablet by mouth daily.      Marland Kitchen nystatin-triamcinolone ointment (MYCOLOG) Apply topically 2 (two) times daily as needed.    . ranitidine (ZANTAC) 150 MG tablet Take 150 mg by mouth as needed.      . simethicone (MYLICON) 0000000 MG chewable tablet Chew 125 mg by mouth every 6 (six) hours as needed.      Marland Kitchen SYNTHROID 88 MCG tablet TAKE 1 TABLET BY MOUTH DAILY BEFORE BREAKFAST. 90 tablet 3  . valACYclovir (VALTREX) 1000 MG tablet Take 1 tablet (1,000 mg total) by mouth as needed. Take 2 tabs AM, 2 tabs PM for one day for fever  blister 360 tablet 0  . YUVAFEM 10 MCG TABS vaginal tablet INSERT 1 TABLET VAGINALLY TWICE WEEKLY 8 tablet 11  . zolpidem (AMBIEN) 5 MG tablet Take 1 tablet (5 mg total) by mouth at bedtime as needed. for sleep 30 tablet 3  . SYNTHROID 88 MCG  tablet Take 1 tablet by mouth daily.  3   No facility-administered medications prior to visit.     ROS Review of Systems  Constitutional: Negative for activity change, appetite change, chills, fatigue and unexpected weight change.  HENT: Negative for congestion, mouth sores and sinus pressure.   Eyes: Negative for visual disturbance.  Respiratory: Negative for cough and chest tightness.   Gastrointestinal: Negative for abdominal pain and nausea.  Genitourinary: Negative for difficulty urinating, frequency and vaginal pain.  Musculoskeletal: Negative for back pain and gait problem.  Skin: Negative for pallor and rash.  Neurological: Negative for dizziness, tremors, weakness, numbness and headaches.  Psychiatric/Behavioral: Positive for sleep disturbance. Negative for confusion and suicidal ideas. The patient is nervous/anxious.     Objective:  BP 110/70   Pulse 69   Wt 111 lb (50.3 kg)   SpO2 97%   BMI 19.66 kg/m   BP Readings from Last 3 Encounters:  11/28/16 110/70  10/17/16 116/80  01/26/16 118/80    Wt Readings from Last 3 Encounters:  11/28/16 111 lb (50.3 kg)  10/17/16 110 lb (49.9 kg)  01/26/16 114 lb (51.7 kg)    Physical Exam  Constitutional: She appears well-developed. No distress.  HENT:  Head: Normocephalic.  Right Ear: External ear normal.  Left Ear: External ear normal.  Nose: Nose normal.  Mouth/Throat: Oropharynx is clear and moist.  Eyes: Conjunctivae are normal. Pupils are equal, round, and reactive to light. Right eye exhibits no discharge. Left eye exhibits no discharge.  Neck: Normal range of motion. Neck supple. No JVD present. No tracheal deviation present. No thyromegaly present.  Cardiovascular: Normal rate, regular rhythm and normal heart sounds.   Pulmonary/Chest: No stridor. No respiratory distress. She has no wheezes.  Abdominal: Soft. Bowel sounds are normal. She exhibits no distension and no mass. There is no tenderness. There is no  rebound and no guarding.  Musculoskeletal: She exhibits no edema or tenderness.  Lymphadenopathy:    She has no cervical adenopathy.  Neurological: She displays normal reflexes. No cranial nerve deficit. She exhibits normal muscle tone. Coordination normal.  Skin: No rash noted. No erythema.  Psychiatric: She has a normal mood and affect. Her behavior is normal. Judgment and thought content normal.    Lab Results  Component Value Date   WBC 5.7 10/19/2016   HGB 12.8 10/19/2016   HCT 37.8 10/19/2016   PLT 265.0 10/19/2016   GLUCOSE 101 (H) 10/19/2016   CHOL 200 10/19/2016   TRIG 109.0 10/19/2016   HDL 71.00 10/19/2016   LDLCALC 107 (H) 10/19/2016   ALT 19 10/19/2016   AST 19 10/19/2016   NA 138 10/19/2016   K 4.0 10/19/2016   CL 102 10/19/2016   CREATININE 0.97 10/19/2016   BUN 14 10/19/2016   CO2 27 10/19/2016   TSH 0.30 (L) 10/19/2016   HGBA1C 5.9 10/02/2015    Mm Screening Breast Tomo Bilateral  Result Date: 03/31/2016 CLINICAL DATA:  Screening. EXAM: 2D DIGITAL SCREENING BILATERAL MAMMOGRAM WITH CAD AND ADJUNCT TOMO COMPARISON:  Previous exam(s). ACR Breast Density Category c: The breast tissue is heterogeneously dense, which may obscure small masses. FINDINGS: There are no findings suspicious for malignancy. Images were processed with CAD. IMPRESSION: No mammographic evidence of malignancy. A result letter of this screening mammogram will be mailed directly to the patient. RECOMMENDATION: Screening mammogram in one year. (Code:SM-B-01Y) BI-RADS CATEGORY  1: Negative. Electronically Signed   By: Dorise Bullion III M.D   On: 03/31/2016 18:28    Assessment & Plan:   There are no diagnoses linked to this encounter. I am having Ms. Clausen maintain her Calcium-Vitamin D-Vitamin K, simethicone, multivitamin, ranitidine, valACYclovir, nystatin-triamcinolone ointment, Estradiol, fluconazole, YUVAFEM, atorvastatin, dexlansoprazole, losartan, zolpidem, ALPRAZolam, escitalopram,  and SYNTHROID.  No orders of the defined types were placed in this encounter.    Follow-up: No Follow-up on file.  Walker Kehr, MD

## 2016-11-28 NOTE — Assessment & Plan Note (Signed)
On Losartan 

## 2016-11-28 NOTE — Assessment & Plan Note (Addendum)
TSH FT4 No wt loss

## 2016-11-28 NOTE — Assessment & Plan Note (Signed)
Trial of Zolpidem CR at hs Cont Lexapro Xanax prn

## 2016-12-02 ENCOUNTER — Encounter: Payer: Self-pay | Admitting: Internal Medicine

## 2017-01-31 ENCOUNTER — Ambulatory Visit (INDEPENDENT_AMBULATORY_CARE_PROVIDER_SITE_OTHER): Payer: Medicare Other | Admitting: Women's Health

## 2017-01-31 ENCOUNTER — Encounter: Payer: Self-pay | Admitting: Women's Health

## 2017-01-31 VITALS — BP 120/76 | Ht 63.0 in | Wt 111.0 lb

## 2017-01-31 DIAGNOSIS — Z01419 Encounter for gynecological examination (general) (routine) without abnormal findings: Secondary | ICD-10-CM

## 2017-01-31 DIAGNOSIS — E2839 Other primary ovarian failure: Secondary | ICD-10-CM | POA: Diagnosis not present

## 2017-01-31 DIAGNOSIS — M810 Age-related osteoporosis without current pathological fracture: Secondary | ICD-10-CM | POA: Diagnosis not present

## 2017-01-31 DIAGNOSIS — M8000XD Age-related osteoporosis with current pathological fracture, unspecified site, subsequent encounter for fracture with routine healing: Secondary | ICD-10-CM

## 2017-01-31 DIAGNOSIS — N952 Postmenopausal atrophic vaginitis: Secondary | ICD-10-CM | POA: Diagnosis not present

## 2017-01-31 NOTE — Progress Notes (Signed)
Rainy Rothman Haberkorn 1946/08/19 [id number]    History:    Presents for breast and pelvic exam. Postmenopausal on no HRT with no bleeding. Has used Vagifem in the past but has not in the last 6 months. Does continue with vaginal dryness. History of cryo-in her 79s with normal Paps after. 2016 T score -2.6 at femoral neck completed 5 years of Fosamax. Had a stress fracture of the right foot with exercise not necessitating surgery wore a boot. History of a breast fibroadenoma. Normal mammograms. Sister breast cancer BRCA negative survivor. Hypertension, hypothyroidism, hypercholesterolemia primary care manages. 01/2007 negative colonoscopy. Current on vaccines.  Past medical history, past surgical history, family history and social history were all reviewed and documented in the EPIC chart. 2 children both lives in [place], 3 grandchildren fourth expected soon. Works out often with a Clinical research associate.  ROS:  A ROS was performed and pertinent positives and negatives are included.  Exam:  Vitals:   01/31/17 1133  BP: 120/76  Weight: 111 lb (50.3 kg)  Height: 5' 3"  (1.6 m)   Body mass index is 19.66 kg/m.   General appearance:  Normal Thyroid:  Symmetrical, normal in size, without palpable masses or nodularity. Respiratory  Auscultation:  Clear without wheezing or rhonchi Cardiovascular  Auscultation:  Regular rate, without rubs, murmurs or gallops  Edema/varicosities:  Not grossly evident Abdominal  Soft,nontender, without masses, guarding or rebound.  Liver/spleen:  No organomegaly noted  Hernia:  None appreciated  Skin  Inspection:  Grossly normal   Breasts: Examined lying and sitting.     Right: Without masses, retractions, discharge or axillary adenopathy.     Left: Without masses, retractions, discharge or axillary adenopathy. Gentitourinary   Inguinal/mons:  Normal without inguinal adenopathy  External genitalia:  Normal  BUS/Urethra/Skene's glands:  Normal  Vagina:  atrophic  Cervix:   Normal  Uterus:  normal in size, shape and contour.  Midline and mobile  Adnexa/parametria:     Rt: Without masses or tenderness.   Lt: Without masses or tenderness.  Anus and perineum: Normal  Digital rectal exam: Normal sphincter tone without palpated masses or tenderness  Assessment/Plan:  71 y.o. MWF G2 P2 for breast and pelvic exam with no complaints   Postmenopausal/no bleeding vaginal estrogen in the past Vaginal atrophy Hypertension/hyperthyroidism/hypercholesterolemia-primary care manages labs and meds Osteoporosis completed 5 years of Fosamax  Plan: Repeat DEXA, reviewed importance of continuing regular exercise, home safety fall prevention discussed. SBE's, continue annual screening mammogram due in April. Schedule colonoscopy due this month. Vaginal dryness reviewed will try estradiol vaginal cream 0.02% prescription, proper use given and reviewed. Continue vaginal lubricants with intercourse. Adamant D 2000 daily encouraged.      Huel Cote WHNP, 1:11 PM 01/31/2017

## 2017-01-31 NOTE — Patient Instructions (Signed)

## 2017-02-06 ENCOUNTER — Other Ambulatory Visit: Payer: Self-pay

## 2017-02-06 NOTE — Telephone Encounter (Signed)
Rx refill Ambien 6.25 mg Last Ov: 11/28/2016 Next OV 02/27/2017 Last refilled 11/28/2016 Please advise, IF REFILLED PLEASE PRINT TO HAVE IT READY!!!!

## 2017-02-07 ENCOUNTER — Other Ambulatory Visit: Payer: Self-pay | Admitting: Gynecology

## 2017-02-07 DIAGNOSIS — M8000XD Age-related osteoporosis with current pathological fracture, unspecified site, subsequent encounter for fracture with routine healing: Secondary | ICD-10-CM

## 2017-02-07 DIAGNOSIS — E2839 Other primary ovarian failure: Secondary | ICD-10-CM

## 2017-02-07 MED ORDER — ZOLPIDEM TARTRATE ER 6.25 MG PO TBCR: 6.2500 mg | EXTENDED_RELEASE_TABLET | Freq: Every evening | ORAL | 2 refills | Status: DC | PRN

## 2017-02-13 NOTE — Telephone Encounter (Signed)
Rec'd fax for refill on the zolpidem ER. Per chart MD approved on 2/20 #30 w/2 refills. Called refill into [facility] had to leave on pharmacy vm...Dawn Tucker

## 2017-02-21 ENCOUNTER — Ambulatory Visit (INDEPENDENT_AMBULATORY_CARE_PROVIDER_SITE_OTHER): Payer: Medicare Other

## 2017-02-21 ENCOUNTER — Other Ambulatory Visit: Payer: Self-pay | Admitting: Gynecology

## 2017-02-21 DIAGNOSIS — E2839 Other primary ovarian failure: Secondary | ICD-10-CM | POA: Diagnosis not present

## 2017-02-21 DIAGNOSIS — M810 Age-related osteoporosis without current pathological fracture: Secondary | ICD-10-CM | POA: Diagnosis not present

## 2017-02-21 DIAGNOSIS — M8000XD Age-related osteoporosis with current pathological fracture, unspecified site, subsequent encounter for fracture with routine healing: Secondary | ICD-10-CM

## 2017-02-22 ENCOUNTER — Encounter: Payer: Self-pay | Admitting: Gynecology

## 2017-02-22 ENCOUNTER — Telehealth: Payer: Self-pay | Admitting: Gynecology

## 2017-02-22 NOTE — Telephone Encounter (Signed)
Pt informed with the below note. 

## 2017-02-22 NOTE — Telephone Encounter (Signed)
Tell patient that her current bone density is stable from her prior study. The continues to measure as osteoporosis but unchanged from before. Options would be to continue to monitor given that she was treated with Fosamax previously or to consider starting a medication now. I would feel comfortable with continued observation with follow up DEXA in 2 years.

## 2017-02-27 ENCOUNTER — Ambulatory Visit: Payer: Medicare Other | Admitting: Internal Medicine

## 2017-03-08 ENCOUNTER — Ambulatory Visit (INDEPENDENT_AMBULATORY_CARE_PROVIDER_SITE_OTHER): Payer: Medicare Other | Admitting: Internal Medicine

## 2017-03-08 ENCOUNTER — Encounter: Payer: Self-pay | Admitting: Internal Medicine

## 2017-03-08 DIAGNOSIS — B029 Zoster without complications: Secondary | ICD-10-CM | POA: Diagnosis not present

## 2017-03-08 MED ORDER — VALACYCLOVIR HCL 1 G PO TABS: 1000.0000 mg | ORAL_TABLET | Freq: Two times a day (BID) | ORAL | 0 refills | Status: DC

## 2017-03-08 NOTE — Progress Notes (Signed)
Pre visit review using our clinic review tool, if applicable. No additional management support is needed unless otherwise documented below in the visit note. 

## 2017-03-08 NOTE — Patient Instructions (Signed)
We have sent in the valtrex to take. Take 1 pill twice a day for the next week.

## 2017-03-08 NOTE — Assessment & Plan Note (Signed)
Refill of valtrex (last rx from pcp in 2014) for 1 pill BID for 1 week. No pain or burning so no need for pain intervention. Advised she can use benadryl cream for itching.

## 2017-03-08 NOTE — Progress Notes (Signed)
   Subjective:    Patient ID: Dawn Tucker, female    DOB: August 01, 1946, 71 y.o.   MRN: [id number]  HPI The patient is a 71 YO female coming in for some itching on her back to her side. She has a couple of spots under her breast as well. 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 She thought it was this and started taking valtrex when it appeared about 5 days ago. She has been taking 500 mg daily since that time (her instructions take 2 BID for 1 day then 1/2 daily til gone). She has not had any pain or discomfort. Some itching in the area. 1 spot on her back and 2 under her right breast. Some burning after she applied some cortisone cream to the area. Overall gradually improving but she is going on trip next week and wanted to make sure she was treating correctly.   Review of Systems  Constitutional: Negative.   Respiratory: Negative.   Gastrointestinal: Negative.   Musculoskeletal: Negative.   Skin: Positive for rash.  Neurological: Negative.       Objective:   Physical Exam  Constitutional: She is oriented to person, place, and time. She appears well-developed and well-nourished. No distress.  HENT:  Head: Normocephalic and atraumatic.  Cardiovascular: Normal rate and regular rhythm.   Pulmonary/Chest: Effort normal and breath sounds normal.  Abdominal: Soft.  Musculoskeletal: She exhibits no edema.  Neurological: She is alert and oriented to person, place, and time.  Skin: Skin is warm and dry.  1 macular lesion on the mid back right, 2 lesions appear to be healing under the right breast. No surrounding cellulitis or redness.    Vitals:   03/08/17 0804  BP: 122/64  Pulse: 74  Temp: 98.1 F (36.7 C)  TempSrc: Oral  SpO2: 98%  Weight: 112 lb (50.8 kg)  Height: 5\' 3"  (1.6 m)      Assessment & Plan:

## 2017-03-13 ENCOUNTER — Other Ambulatory Visit: Payer: Self-pay | Admitting: Gynecology

## 2017-03-13 DIAGNOSIS — Z1231 Encounter for screening mammogram for malignant neoplasm of breast: Secondary | ICD-10-CM

## 2017-03-14 ENCOUNTER — Ambulatory Visit (INDEPENDENT_AMBULATORY_CARE_PROVIDER_SITE_OTHER): Payer: Medicare Other | Admitting: Internal Medicine

## 2017-03-14 ENCOUNTER — Ambulatory Visit: Payer: Medicare Other | Admitting: Internal Medicine

## 2017-03-14 ENCOUNTER — Encounter: Payer: Self-pay | Admitting: Internal Medicine

## 2017-03-14 DIAGNOSIS — B029 Zoster without complications: Secondary | ICD-10-CM | POA: Diagnosis not present

## 2017-03-14 NOTE — Patient Instructions (Signed)
You are safe to be around pregnant people and even down to age 71 safe to be around.

## 2017-03-14 NOTE — Assessment & Plan Note (Signed)
Resolving, will continue to take 1 more day of valtrex to complete and reassured that she does not need more treatment. She is about 90% healed on exam. Is not consider contagious and is okay to be around both pregnant women (since rash is gone and under her clothing) and grandchildren (youngest of which is 67).

## 2017-03-14 NOTE — Progress Notes (Signed)
Pre visit review using our clinic review tool, if applicable. No additional management support is needed unless otherwise documented below in the visit note. 

## 2017-03-14 NOTE — Progress Notes (Signed)
   Subjective:    Patient ID: Dawn Tucker, female    DOB: 07/10/1946, 71 y.o.   MRN: [id number]  HPI The patient is a 71 YO female coming in for questions about her recent shingles diagnosis. She has taken the valtrex for about 6 days now and the rash is decreasing in size and redness. Still mild itching. No pain. No fevers or chills. With passover coming this weekend she wants to make sure that she is not contagious around her children (youngest 81) or pregnant daughter.   Review of Systems  Constitutional: Negative.   Respiratory: Negative.   Cardiovascular: Negative.   Gastrointestinal: Negative.   Musculoskeletal: Negative.   Skin: Positive for rash.       Almost gone  Neurological: Negative.       Objective:   Physical Exam  Constitutional: She is oriented to person, place, and time. She appears well-developed and well-nourished.  HENT:  Head: Normocephalic and atraumatic.  Eyes: EOM are normal.  Cardiovascular: Normal rate and regular rhythm.   Pulmonary/Chest: Effort normal.  Abdominal: Soft.  Neurological: She is alert and oriented to person, place, and time.  Skin: Skin is warm and dry.  Mild redness on the front stomach, healed from prior and no longer and swelling. No surrounding cellulitis or crusting.    Vitals:   03/14/17 1028  BP: 100/60  Pulse: 73  Resp: 12  Temp: 97.8 F (36.6 C)  TempSrc: Oral  SpO2: 99%  Weight: 110 lb (49.9 kg)  Height: 5\' 3"  (1.6 m)      Assessment & Plan:

## 2017-03-20 ENCOUNTER — Ambulatory Visit (INDEPENDENT_AMBULATORY_CARE_PROVIDER_SITE_OTHER): Payer: Medicare Other | Admitting: Internal Medicine

## 2017-03-20 ENCOUNTER — Encounter: Payer: Self-pay | Admitting: Internal Medicine

## 2017-03-20 DIAGNOSIS — F4323 Adjustment disorder with mixed anxiety and depressed mood: Secondary | ICD-10-CM | POA: Diagnosis not present

## 2017-03-20 DIAGNOSIS — E039 Hypothyroidism, unspecified: Secondary | ICD-10-CM

## 2017-03-20 DIAGNOSIS — E782 Mixed hyperlipidemia: Secondary | ICD-10-CM

## 2017-03-20 MED ORDER — ZOLPIDEM TARTRATE ER 6.25 MG PO TBCR: 6.2500 mg | EXTENDED_RELEASE_TABLET | Freq: Every evening | ORAL | 2 refills | Status: DC | PRN

## 2017-03-20 NOTE — Progress Notes (Signed)
Subjective:  Patient ID: Dawn Tucker, female    DOB: 03/24/1946  Age: 71 y.o. MRN: [id number]  CC: Anxiety; Hypertension; and Hypothyroidism   HPI Dawn Tucker presents for hypothyroidism, anxiety, dyslipidemia f/u  Outpatient Medications Prior to Visit  Medication Sig Dispense Refill  . ALPRAZolam (XANAX) 0.25 MG tablet Take 1 tablet (0.25 mg total) by mouth 2 (two) times daily as needed for anxiety. 60 tablet 2  . atorvastatin (LIPITOR) 10 MG tablet Take 1 tablet (10 mg total) by mouth daily. 90 tablet 3  . Calcium-Vitamin D-Vitamin K [id number] MG-UNT-MCG CHEW Chew by mouth daily. Reported on 01/26/2016    . dexlansoprazole (DEXILANT) 60 MG capsule TAKE 1 CAPSULE BY MOUTH DAILY 90 capsule 3  . escitalopram (LEXAPRO) 5 MG tablet Take 1 tablet (5 mg total) by mouth daily. 90 tablet 1  . Estradiol (VAGIFEM) 10 MCG TABS vaginal tablet INSERT ONE TABLET VAGINALLY TWICE WEEKLY 8 tablet 12  . fluconazole (DIFLUCAN) 150 MG tablet Take 1 tablet (150 mg total) by mouth once. 1 tablet 1  . losartan (COZAAR) 50 MG tablet Take 1 tablet (50 mg total) by mouth daily. 90 tablet 3  . Multiple Vitamin (MULTIVITAMIN) tablet Take 1 tablet by mouth daily.      Marland Kitchen nystatin-triamcinolone ointment (MYCOLOG) Apply topically 2 (two) times daily as needed.    . ranitidine (ZANTAC) 150 MG tablet Take 150 mg by mouth as needed.      . simethicone (MYLICON) 382 MG chewable tablet Chew 125 mg by mouth every 6 (six) hours as needed.      Marland Kitchen SYNTHROID 88 MCG tablet TAKE 1 TABLET BY MOUTH DAILY BEFORE BREAKFAST. 90 tablet 3  . valACYclovir (VALTREX) 1000 MG tablet Take 1 tablet (1,000 mg total) by mouth 2 (two) times daily. 30 tablet 0  . YUVAFEM 10 MCG TABS vaginal tablet INSERT 1 TABLET VAGINALLY TWICE WEEKLY 8 tablet 11  . zolpidem (AMBIEN CR) 6.25 MG CR tablet Take 1 tablet (6.25 mg total) by mouth at bedtime as needed for sleep. 30 tablet 2  . zolpidem (AMBIEN) 5 MG tablet Take 1 tablet (5 mg total) by  mouth at bedtime as needed. for sleep 30 tablet 3   No facility-administered medications prior to visit.     ROS Review of Systems  Constitutional: Negative for activity change, appetite change, chills, fatigue and unexpected weight change.  HENT: Negative for congestion, mouth sores and sinus pressure.   Eyes: Negative for visual disturbance.  Respiratory: Negative for cough and chest tightness.   Gastrointestinal: Negative for abdominal pain and nausea.  Genitourinary: Negative for difficulty urinating, frequency and vaginal pain.  Musculoskeletal: Negative for back pain and gait problem.  Skin: Negative for pallor and rash.  Neurological: Negative for dizziness, tremors, weakness, numbness and headaches.  Psychiatric/Behavioral: Negative for confusion, sleep disturbance and suicidal ideas. The patient is not nervous/anxious.     Objective:  BP 102/88   Pulse 82   Temp 97.6 F (36.4 C)   Ht 5\' 3"  (1.6 m)   Wt 110 lb (49.9 kg)   SpO2 98%   BMI 19.49 kg/m   BP Readings from Last 3 Encounters:  03/20/17 102/88  03/14/17 100/60  03/08/17 122/64    Wt Readings from Last 3 Encounters:  03/20/17 110 lb (49.9 kg)  03/14/17 110 lb (49.9 kg)  03/08/17 112 lb (50.8 kg)    Physical Exam  Constitutional: She appears well-developed. No distress.  HENT:  Head: Normocephalic.  Right Ear: External ear normal.  Left Ear: External ear normal.  Nose: Nose normal.  Mouth/Throat: Oropharynx is clear and moist.  Eyes: Conjunctivae are normal. Pupils are equal, round, and reactive to light. Right eye exhibits no discharge. Left eye exhibits no discharge.  Neck: Normal range of motion. Neck supple. No JVD present. No tracheal deviation present. No thyromegaly present.  Cardiovascular: Normal rate, regular rhythm and normal heart sounds.   Pulmonary/Chest: No stridor. No respiratory distress. She has no wheezes.  Abdominal: Soft. Bowel sounds are normal. She exhibits no distension and  no mass. There is no tenderness. There is no rebound and no guarding.  Musculoskeletal: She exhibits no edema or tenderness.  Lymphadenopathy:    She has no cervical adenopathy.  Neurological: She displays normal reflexes. No cranial nerve deficit. She exhibits normal muscle tone. Coordination normal.  Skin: No rash noted. No erythema.  Psychiatric: She has a normal mood and affect. Her behavior is normal. Judgment and thought content normal.    Lab Results  Component Value Date   WBC 5.7 10/19/2016   HGB 12.8 10/19/2016   HCT 37.8 10/19/2016   PLT 265.0 10/19/2016   GLUCOSE 101 (H) 10/19/2016   CHOL 200 10/19/2016   TRIG 109.0 10/19/2016   HDL 71.00 10/19/2016   LDLCALC 107 (H) 10/19/2016   ALT 19 10/19/2016   AST 19 10/19/2016   NA 138 10/19/2016   K 4.0 10/19/2016   CL 102 10/19/2016   CREATININE 0.97 10/19/2016   BUN 14 10/19/2016   CO2 27 10/19/2016   TSH 0.10 (L) 11/28/2016   HGBA1C 5.9 10/02/2015    Mm Screening Breast Tomo Bilateral  Result Date: 03/31/2016 CLINICAL DATA:  Screening. EXAM: 2D DIGITAL SCREENING BILATERAL MAMMOGRAM WITH CAD AND ADJUNCT TOMO COMPARISON:  Previous exam(s). ACR Breast Density Category c: The breast tissue is heterogeneously dense, which may obscure small masses. FINDINGS: There are no findings suspicious for malignancy. Images were processed with CAD. IMPRESSION: No mammographic evidence of malignancy. A result letter of this screening mammogram will be mailed directly to the patient. RECOMMENDATION: Screening mammogram in one year. (Code:SM-B-01Y) BI-RADS CATEGORY  1: Negative. Electronically Signed   By: Dorise Bullion III M.D   On: 03/31/2016 18:28    Assessment & Plan:   There are no diagnoses linked to this encounter. I am having Ms. Sherlin maintain her Calcium-Vitamin D-Vitamin K, simethicone, multivitamin, ranitidine, nystatin-triamcinolone ointment, Estradiol, fluconazole, YUVAFEM, atorvastatin, dexlansoprazole, losartan,  zolpidem, ALPRAZolam, escitalopram, SYNTHROID, zolpidem, and valACYclovir.  No orders of the defined types were placed in this encounter.    Follow-up: No Follow-up on file.  Walker Kehr, MD

## 2017-03-20 NOTE — Assessment & Plan Note (Signed)
Labs

## 2017-04-05 ENCOUNTER — Ambulatory Visit
Admission: RE | Admit: 2017-04-05 | Discharge: 2017-04-05 | Disposition: A | Discharge status: Home / Self Care | Payer: Medicare Other | Source: Ambulatory Visit | Attending: Gynecology | Admitting: Gynecology

## 2017-04-05 DIAGNOSIS — Z1231 Encounter for screening mammogram for malignant neoplasm of breast: Secondary | ICD-10-CM

## 2017-05-03 ENCOUNTER — Encounter: Payer: Self-pay | Admitting: Gynecology

## 2017-05-04 ENCOUNTER — Encounter: Payer: Self-pay | Admitting: Internal Medicine

## 2017-05-04 ENCOUNTER — Other Ambulatory Visit: Payer: Self-pay

## 2017-05-04 MED ORDER — ESCITALOPRAM OXALATE 5 MG PO TABS: 5.0000 mg | ORAL_TABLET | Freq: Every day | ORAL | 1 refills | Status: DC

## 2017-05-04 NOTE — Assessment & Plan Note (Signed)
On Lipitor 

## 2017-05-04 NOTE — Assessment & Plan Note (Signed)
Better Xanax prn

## 2017-05-04 NOTE — Telephone Encounter (Signed)
rec's fax from [facility] for Escitalopram. RX sent

## 2017-06-22 ENCOUNTER — Telehealth: Payer: Self-pay

## 2017-06-22 MED ORDER — ZOLPIDEM TARTRATE ER 6.25 MG PO TBCR: 6.2500 mg | EXTENDED_RELEASE_TABLET | Freq: Every evening | ORAL | 5 refills | Status: DC | PRN

## 2017-06-22 NOTE — Telephone Encounter (Signed)
RX signed and faxed.

## 2017-06-22 NOTE — Telephone Encounter (Signed)
-----   Message from Warden Fillers, RN sent at 06/22/2017  8:28 AM EDT ----- Regarding: FW: re-fill Please re-fill ----- Message ----- From: Cassandria Anger, MD Sent: 06/22/2017   7:55 AM To: Warden Fillers, RN Subject: RE: re-fill                                    OK to fill this/these prescription(s) with additional refills x5 Needs to have an OV every 6 months Thank you!  ----- Message ----- From: Warden Fillers, RN Sent: 06/19/2017  11:13 AM To: Cassandria Anger, MD Subject: re-fill                                        Request for Zolpidem, 5 mg tabs, 30 tabs.  Last ordered on 03/20/17 for 30 tabs with 2 re-fills.  Please advise.

## 2017-06-27 DIAGNOSIS — D2272 Melanocytic nevi of left lower limb, including hip: Secondary | ICD-10-CM | POA: Diagnosis not present

## 2017-06-27 DIAGNOSIS — L821 Other seborrheic keratosis: Secondary | ICD-10-CM | POA: Diagnosis not present

## 2017-06-27 DIAGNOSIS — L601 Onycholysis: Secondary | ICD-10-CM | POA: Diagnosis not present

## 2017-06-27 DIAGNOSIS — B009 Herpesviral infection, unspecified: Secondary | ICD-10-CM | POA: Diagnosis not present

## 2017-06-30 ENCOUNTER — Other Ambulatory Visit: Payer: Self-pay | Admitting: Internal Medicine

## 2017-08-16 ENCOUNTER — Ambulatory Visit (INDEPENDENT_AMBULATORY_CARE_PROVIDER_SITE_OTHER): Payer: Medicare Other

## 2017-08-16 DIAGNOSIS — Z23 Encounter for immunization: Secondary | ICD-10-CM

## 2017-10-31 ENCOUNTER — Ambulatory Visit (INDEPENDENT_AMBULATORY_CARE_PROVIDER_SITE_OTHER): Payer: Medicare Other | Admitting: Internal Medicine

## 2017-10-31 ENCOUNTER — Encounter: Payer: Self-pay | Admitting: Internal Medicine

## 2017-10-31 ENCOUNTER — Other Ambulatory Visit (INDEPENDENT_AMBULATORY_CARE_PROVIDER_SITE_OTHER): Payer: Medicare Other

## 2017-10-31 VITALS — BP 118/74 | HR 63 | Temp 98.1°F | Ht 63.0 in | Wt 113.0 lb

## 2017-10-31 DIAGNOSIS — M65311 Trigger thumb, right thumb: Secondary | ICD-10-CM | POA: Diagnosis not present

## 2017-10-31 DIAGNOSIS — F4323 Adjustment disorder with mixed anxiety and depressed mood: Secondary | ICD-10-CM

## 2017-10-31 DIAGNOSIS — E039 Hypothyroidism, unspecified: Secondary | ICD-10-CM

## 2017-10-31 DIAGNOSIS — I10 Essential (primary) hypertension: Secondary | ICD-10-CM | POA: Diagnosis not present

## 2017-10-31 LAB — URINALYSIS
Bilirubin Urine: NEGATIVE
HGB URINE DIPSTICK: NEGATIVE
Ketones, ur: NEGATIVE
Leukocytes, UA: NEGATIVE
NITRITE: NEGATIVE
Specific Gravity, Urine: 1.01 (ref 1.000–1.030)
Total Protein, Urine: NEGATIVE
Urine Glucose: NEGATIVE
Urobilinogen, UA: 0.2 (ref 0.0–1.0)
pH: 6 (ref 5.0–8.0)

## 2017-10-31 LAB — LIPID PANEL
Cholesterol: 183 mg/dL (ref 0–200)
HDL: 69.6 mg/dL (ref >39.00)
LDL Cholesterol: 95 mg/dL (ref 0–99)
NONHDL: 113.61
Total CHOL/HDL Ratio: 3
Triglycerides: 94 mg/dL (ref 0.0–149.0)
VLDL: 18.8 mg/dL (ref 0.0–40.0)

## 2017-10-31 LAB — HEPATIC FUNCTION PANEL
ALK PHOS: 73 U/L (ref 39–117)
ALT: 21 U/L (ref 0–35)
AST: 21 U/L (ref 0–37)
Albumin: 4.3 g/dL (ref 3.5–5.2)
BILIRUBIN DIRECT: 0.1 mg/dL (ref 0.0–0.3)
BILIRUBIN TOTAL: 0.5 mg/dL (ref 0.2–1.2)
TOTAL PROTEIN: 7.3 g/dL (ref 6.0–8.3)

## 2017-10-31 LAB — CBC WITH DIFFERENTIAL/PLATELET
Basophils Absolute: 0 10*3/uL (ref 0.0–0.1)
Basophils Relative: 0.7 % (ref 0.0–3.0)
EOS PCT: 2.8 % (ref 0.0–5.0)
Eosinophils Absolute: 0.2 10*3/uL (ref 0.0–0.7)
HCT: 39.3 % (ref 36.0–46.0)
HEMOGLOBIN: 12.9 g/dL (ref 12.0–15.0)
Lymphocytes Relative: 24.4 % (ref 12.0–46.0)
Lymphs Abs: 1.4 10*3/uL (ref 0.7–4.0)
MCHC: 32.9 g/dL (ref 30.0–36.0)
MCV: 88.2 fl (ref 78.0–100.0)
MONO ABS: 0.5 10*3/uL (ref 0.1–1.0)
Monocytes Relative: 8.8 % (ref 3.0–12.0)
Neutro Abs: 3.6 10*3/uL (ref 1.4–7.7)
Neutrophils Relative %: 63.3 % (ref 43.0–77.0)
Platelets: 230 10*3/uL (ref 150.0–400.0)
RBC: 4.46 Mil/uL (ref 3.87–5.11)
RDW: 13.2 % (ref 11.5–15.5)
WBC: 5.6 10*3/uL (ref 4.0–10.5)

## 2017-10-31 LAB — T4, FREE: FREE T4: 0.97 ng/dL (ref 0.60–1.60)

## 2017-10-31 LAB — BASIC METABOLIC PANEL
BUN: 17 mg/dL (ref 6–23)
CHLORIDE: 103 meq/L (ref 96–112)
CO2: 32 meq/L (ref 19–32)
Calcium: 9.6 mg/dL (ref 8.4–10.5)
Creatinine, Ser: 1.01 mg/dL (ref 0.40–1.20)
GFR: 57.42 mL/min — ABNORMAL LOW (ref >60.00)
GLUCOSE: 93 mg/dL (ref 70–99)
POTASSIUM: 4.7 meq/L (ref 3.5–5.1)
Sodium: 139 mEq/L (ref 135–145)

## 2017-10-31 LAB — TSH: TSH: 0.05 u[IU]/mL — AB (ref 0.35–4.50)

## 2017-10-31 MED ORDER — DEXLANSOPRAZOLE 60 MG PO CPDR: DELAYED_RELEASE_CAPSULE | ORAL | 3 refills | Status: DC

## 2017-10-31 MED ORDER — ESCITALOPRAM OXALATE 5 MG PO TABS: 5.0000 mg | ORAL_TABLET | Freq: Every day | ORAL | 1 refills | Status: DC

## 2017-10-31 MED ORDER — SYNTHROID 88 MCG PO TABS: 88.0000 ug | ORAL_TABLET | Freq: Every day | ORAL | 3 refills | Status: DC

## 2017-10-31 MED ORDER — LOSARTAN POTASSIUM 50 MG PO TABS: 25.0000 mg | ORAL_TABLET | Freq: Every day | ORAL | 3 refills | Status: DC

## 2017-10-31 MED ORDER — LOSARTAN POTASSIUM 50 MG PO TABS: 50.0000 mg | ORAL_TABLET | Freq: Every day | ORAL | 3 refills | Status: DC

## 2017-10-31 MED ORDER — DICLOFENAC SODIUM 1 % TD GEL: 1.0000 "application " | Freq: Four times a day (QID) | TRANSDERMAL | 0 refills | Status: DC

## 2017-10-31 MED ORDER — ZOSTER VAC RECOMB ADJUVANTED 50 MCG/0.5ML IM SUSR: 0.5000 mL | Freq: Once | INTRAMUSCULAR | 1 refills | Status: AC

## 2017-10-31 MED ORDER — ATORVASTATIN CALCIUM 10 MG PO TABS: 10.0000 mg | ORAL_TABLET | Freq: Every day | ORAL | 3 refills | Status: DC

## 2017-10-31 NOTE — Assessment & Plan Note (Signed)
Labs Levothroid 

## 2017-10-31 NOTE — Assessment & Plan Note (Signed)
Voltaren gel Dr Noemi Chapel if not better

## 2017-10-31 NOTE — Patient Instructions (Addendum)
Trigger Finger Trigger finger (stenosing tenosynovitis) is a condition that causes a finger to get stuck in a bent position. Each finger has a tough, cord-like tissue that connects muscle to bone (tendon), and each tendon is surrounded by a tunnel of tissue (tendon sheath). To move your finger, your tendon needs to slide freely through the sheath. Trigger finger happens when the tendon or the sheath thickens, making it difficult to move your finger. Trigger finger can affect any finger or a thumb. It may affect more than one finger. Mild cases may clear up with rest and medicine. Severe cases require more treatment. What are the causes? Trigger finger is caused by a thickened finger tendon or tendon sheath. The cause of this thickening is not known. What increases the risk? The following factors may make you more likely to develop this condition:  Doing activities that require a strong grip.  Having rheumatoid arthritis, gout, or diabetes.  Being 81-26 years old.  Being a woman.  What are the signs or symptoms? Symptoms of this condition include:  Pain when bending or straightening your finger.  Tenderness or swelling where your finger attaches to the palm of your hand.  A lump in the palm of your hand or on the inside of your finger.  Hearing a popping sound when you try to straighten your finger.  Feeling a popping, catching, or locking sensation when you try to straighten your finger.  Being unable to straighten your finger.  How is this diagnosed? This condition is diagnosed based on your symptoms and a physical exam. How is this treated? This condition may be treated by:  Resting your finger and avoiding activities that make symptoms worse.  Wearing a finger splint to keep your finger in a slightly bent position.  Taking NSAIDs to relieve pain and swelling.  Injecting medicine (steroids) into the tendon sheath to reduce swelling and irritation. Injections may need to be  repeated.  Having surgery to open the tendon sheath. This may be done if other treatments do not work and you cannot straighten your finger. You may need physical therapy after surgery.  Follow these instructions at home:  Use moist heat to help reduce pain and swelling as told by your health care provider.  Rest your finger and avoid activities that make pain worse. Return to normal activities as told by your health care provider.  If you have a splint, wear it as told by your health care provider.  Take over-the-counter and prescription medicines only as told by your health care provider.  Keep all follow-up visits as told by your health care provider. This is important. Contact a health care provider if:  Your symptoms are not improving with home care. Summary  Trigger finger (stenosing tenosynovitis) causes your finger to get stuck in a bent position, and it can make it difficult and painful to straighten your finger.  This condition develops when a finger tendon or tendon sheath thickens.  Treatment starts with resting, wearing a splint, and taking NSAIDs.  In severe cases, surgery to open the tendon sheath may be needed. This information is not intended to replace advice given to you by your health care provider. Make sure you discuss any questions you have with your health care provider. Document Released: 09/24/2004 Document Revised: 11/15/2016 Document Reviewed: 11/15/2016 Elsevier Interactive Patient Education  2017 [employer]vier Inc.   Hip openers Try not to cross your legs

## 2017-10-31 NOTE — Progress Notes (Signed)
Subjective:  Patient ID: Dawn Tucker, female    DOB: Feb 02, 1946  Age: 71 y.o. MRN: [id number]  CC: No chief complaint on file.   HPI Dawn Tucker presents for R thumb triggering F/u anxiety, hypothyroidism, HTN  Outpatient Medications Prior to Visit  Medication Sig Dispense Refill  . ALPRAZolam (XANAX) 0.25 MG tablet Take 1 tablet (0.25 mg total) by mouth 2 (two) times daily as needed for anxiety. 60 tablet 2  . Calcium-Vitamin D-Vitamin K [id number] MG-UNT-MCG CHEW Chew by mouth daily. Reported on 01/26/2016    . Estradiol (VAGIFEM) 10 MCG TABS vaginal tablet INSERT ONE TABLET VAGINALLY TWICE WEEKLY 8 tablet 12  . fluconazole (DIFLUCAN) 150 MG tablet Take 1 tablet (150 mg total) by mouth once. 1 tablet 1  . Multiple Vitamin (MULTIVITAMIN) tablet Take 1 tablet by mouth daily.      Marland Kitchen nystatin-triamcinolone ointment (MYCOLOG) Apply topically 2 (two) times daily as needed.    . ranitidine (ZANTAC) 150 MG tablet Take 150 mg by mouth as needed.      . simethicone (MYLICON) 478 MG chewable tablet Chew 125 mg by mouth every 6 (six) hours as needed.      . valACYclovir (VALTREX) 1000 MG tablet TAKE 1 TABLET(1000 MG) BY MOUTH TWICE DAILY 30 tablet 0  . YUVAFEM 10 MCG TABS vaginal tablet INSERT 1 TABLET VAGINALLY TWICE WEEKLY 8 tablet 11  . zolpidem (AMBIEN CR) 6.25 MG CR tablet Take 1 tablet (6.25 mg total) by mouth at bedtime as needed for sleep. 30 tablet 5  . atorvastatin (LIPITOR) 10 MG tablet Take 1 tablet (10 mg total) by mouth daily. 90 tablet 3  . dexlansoprazole (DEXILANT) 60 MG capsule TAKE 1 CAPSULE BY MOUTH DAILY 90 capsule 3  . escitalopram (LEXAPRO) 5 MG tablet Take 1 tablet (5 mg total) by mouth daily. 90 tablet 1  . losartan (COZAAR) 50 MG tablet Take 1 tablet (50 mg total) by mouth daily. 90 tablet 3  . SYNTHROID 88 MCG tablet TAKE 1 TABLET BY MOUTH DAILY BEFORE BREAKFAST. 90 tablet 3   No facility-administered medications prior to visit.     ROS Review of Systems    Constitutional: Negative for activity change, appetite change, chills, fatigue and unexpected weight change.  HENT: Negative for congestion, mouth sores and sinus pressure.   Eyes: Negative for visual disturbance.  Respiratory: Negative for cough and chest tightness.   Gastrointestinal: Negative for abdominal pain and nausea.  Genitourinary: Negative for difficulty urinating, frequency and vaginal pain.  Musculoskeletal: Negative for back pain and gait problem.  Skin: Negative for pallor and rash.  Neurological: Negative for dizziness, tremors, weakness, numbness and headaches.  Psychiatric/Behavioral: Negative for confusion and sleep disturbance.    Objective:  BP 118/74 (BP Location: Left Arm, Patient Position: Sitting, Cuff Size: Normal)   Pulse 63   Temp 98.1 F (36.7 C) (Oral)   Ht 5\' 3"  (1.6 m)   Wt 113 lb (51.3 kg)   SpO2 99%   BMI 20.02 kg/m   BP Readings from Last 3 Encounters:  10/31/17 118/74  03/20/17 102/88  03/14/17 100/60    Wt Readings from Last 3 Encounters:  10/31/17 113 lb (51.3 kg)  03/20/17 110 lb (49.9 kg)  03/14/17 110 lb (49.9 kg)    Physical Exam  Constitutional: She appears well-developed. No distress.  HENT:  Head: Normocephalic.  Right Ear: External ear normal.  Left Ear: External ear normal.  Nose: Nose normal.  Mouth/Throat: Oropharynx is clear  and moist.  Eyes: Conjunctivae are normal. Pupils are equal, round, and reactive to light. Right eye exhibits no discharge. Left eye exhibits no discharge.  Neck: Normal range of motion. Neck supple. No JVD present. No tracheal deviation present. No thyromegaly present.  Cardiovascular: Normal rate, regular rhythm and normal heart sounds.  Pulmonary/Chest: No stridor. No respiratory distress. She has no wheezes.  Abdominal: Soft. Bowel sounds are normal. She exhibits no distension and no mass. There is no tenderness. There is no rebound and no guarding.  Musculoskeletal: She exhibits no edema or  tenderness.  Lymphadenopathy:    She has no cervical adenopathy.  Neurological: She displays normal reflexes. No cranial nerve deficit. She exhibits normal muscle tone. Coordination normal.  Skin: No rash noted. No erythema.  Psychiatric: She has a normal mood and affect. Her behavior is normal. Judgment and thought content normal.  R thumb painful at base, triggering....  Lab Results  Component Value Date   WBC 5.7 10/19/2016   HGB 12.8 10/19/2016   HCT 37.8 10/19/2016   PLT 265.0 10/19/2016   GLUCOSE 101 (H) 10/19/2016   CHOL 200 10/19/2016   TRIG 109.0 10/19/2016   HDL 71.00 10/19/2016   LDLCALC 107 (H) 10/19/2016   ALT 19 10/19/2016   AST 19 10/19/2016   NA 138 10/19/2016   K 4.0 10/19/2016   CL 102 10/19/2016   CREATININE 0.97 10/19/2016   BUN 14 10/19/2016   CO2 27 10/19/2016   TSH 0.10 (L) 11/28/2016   HGBA1C 5.9 10/02/2015    Mm Screening Breast Tomo Bilateral  Result Date: 04/05/2017 CLINICAL DATA:  Screening. EXAM: 2D DIGITAL SCREENING BILATERAL MAMMOGRAM WITH CAD AND ADJUNCT TOMO COMPARISON:  Previous exam(s). ACR Breast Density Category c: The breast tissue is heterogeneously dense, which may obscure small masses. FINDINGS: There are no findings suspicious for malignancy. Images were processed with CAD. IMPRESSION: No mammographic evidence of malignancy. A result letter of this screening mammogram will be mailed directly to the patient. RECOMMENDATION: Screening mammogram in one year. (Code:SM-B-01Y) BI-RADS CATEGORY  1: Negative. Electronically Signed   By: Trude Mcburney M.D.   On: 04/05/2017 15:58    Assessment & Plan:   Diagnoses and all orders for this visit:  Trigger thumb of right hand  Other orders -     SYNTHROID 88 MCG tablet; Take 1 tablet (88 mcg total) daily before breakfast by mouth. -     atorvastatin (LIPITOR) 10 MG tablet; Take 1 tablet (10 mg total) daily by mouth. -     losartan (COZAAR) 50 MG tablet; Take 1 tablet (50 mg total) daily by  mouth. -     dexlansoprazole (DEXILANT) 60 MG capsule; TAKE 1 CAPSULE BY MOUTH DAILY -     escitalopram (LEXAPRO) 5 MG tablet; Take 1 tablet (5 mg total) daily by mouth. -     diclofenac sodium (VOLTAREN) 1 % GEL; Apply 1 application 4 (four) times daily topically.   I have changed Clarise Cruz L. Cassetta's SYNTHROID, atorvastatin, losartan, and escitalopram. I am also having her start on diclofenac sodium. Additionally, I am having her maintain her Calcium-Vitamin D-Vitamin K, simethicone, multivitamin, ranitidine, nystatin-triamcinolone ointment, Estradiol, fluconazole, YUVAFEM, ALPRAZolam, zolpidem, valACYclovir, and dexlansoprazole.  Meds ordered this encounter  Medications  . SYNTHROID 88 MCG tablet    Sig: Take 1 tablet (88 mcg total) daily before breakfast by mouth.    Dispense:  90 tablet    Refill:  3  . atorvastatin (LIPITOR) 10 MG tablet  Sig: Take 1 tablet (10 mg total) daily by mouth.    Dispense:  90 tablet    Refill:  3  . losartan (COZAAR) 50 MG tablet    Sig: Take 1 tablet (50 mg total) daily by mouth.    Dispense:  90 tablet    Refill:  3  . dexlansoprazole (DEXILANT) 60 MG capsule    Sig: TAKE 1 CAPSULE BY MOUTH DAILY    Dispense:  90 capsule    Refill:  3  . escitalopram (LEXAPRO) 5 MG tablet    Sig: Take 1 tablet (5 mg total) daily by mouth.    Dispense:  90 tablet    Refill:  1  . diclofenac sodium (VOLTAREN) 1 % GEL    Sig: Apply 1 application 4 (four) times daily topically.    Dispense:  100 g    Refill:  0     Follow-up: No Follow-up on file.  Walker Kehr, MD

## 2017-11-01 ENCOUNTER — Encounter: Payer: Self-pay | Admitting: Internal Medicine

## 2017-11-01 NOTE — Assessment & Plan Note (Signed)
Xanax, Zolpidem prn; not together  Potential benefits of a long term benzodiazepines  use as well as potential risks  and complications were explained to the patient and were aknowledged.

## 2017-11-30 DIAGNOSIS — M65311 Trigger thumb, right thumb: Secondary | ICD-10-CM | POA: Diagnosis not present

## 2017-12-07 ENCOUNTER — Other Ambulatory Visit: Payer: Self-pay

## 2017-12-07 MED ORDER — DICLOFENAC SODIUM 1 % TD GEL: 1.0000 "application " | Freq: Four times a day (QID) | TRANSDERMAL | 0 refills | Status: DC

## 2017-12-19 DIAGNOSIS — H3530 Unspecified macular degeneration: Secondary | ICD-10-CM

## 2017-12-19 HISTORY — DX: Unspecified macular degeneration: H35.30

## 2018-02-05 ENCOUNTER — Encounter: Payer: Medicare Other | Admitting: Women's Health

## 2018-03-08 ENCOUNTER — Other Ambulatory Visit: Payer: Self-pay | Admitting: Gynecology

## 2018-03-08 DIAGNOSIS — Z1231 Encounter for screening mammogram for malignant neoplasm of breast: Secondary | ICD-10-CM

## 2018-03-20 ENCOUNTER — Encounter: Payer: Self-pay | Admitting: Women's Health

## 2018-03-20 ENCOUNTER — Ambulatory Visit (INDEPENDENT_AMBULATORY_CARE_PROVIDER_SITE_OTHER): Payer: Medicare Other | Admitting: Women's Health

## 2018-03-20 VITALS — BP 121/81 | Ht 63.0 in | Wt 113.0 lb

## 2018-03-20 DIAGNOSIS — Z01419 Encounter for gynecological examination (general) (routine) without abnormal findings: Secondary | ICD-10-CM | POA: Diagnosis not present

## 2018-03-20 NOTE — Progress Notes (Signed)
Dawn Tucker [birth year] [id number]    History:    Presents for breast and pelvic exam.  Cryo greater than 40 years ago prior with normal Paps after.  Normal mammogram history.  Osteoporosis T score -2.6 at left and right femoral neck stable, completed 5 years of Fosamax.  Stress fracture of foot with accidental fall doing exercise.  Primary care manages hypertension, hypothyroidism and IBS.  Due for repeat screening colonoscopy.  Benign colon polyp 2008.  Sister breast cancer BRCA negative.  Rare sexual intercourse husband's health.  Current on vaccines.  Past medical history, past surgical history, family history and social history were all reviewed and documented in the EPIC chart.  2 daughters both live in [place] and has 4 grandchildren.  ROS:  A ROS was performed and pertinent positives and negatives are included.  Exam:  Vitals:   03/20/18 1212  BP: 121/81  Weight: 113 lb (51.3 kg)  Height: _0  (1.6 m)   Body mass index is 20.02 kg/m.   General appearance:  Normal Thyroid:  Symmetrical, normal in size, without palpable masses or nodularity. Respiratory  Auscultation:  Clear without wheezing or rhonchi Cardiovascular  Auscultation:  Regular rate, without rubs, murmurs or gallops  Edema/varicosities:  Not grossly evident Abdominal  Soft,nontender, without masses, guarding or rebound.  Liver/spleen:  No organomegaly noted  Hernia:  None appreciated  Skin  Inspection:  Grossly normal   Breasts: Examined lying and sitting.     Right: Without masses, retractions, discharge or axillary adenopathy.     Left: Without masses, retractions, discharge or axillary adenopathy. Gentitourinary   Inguinal/mons:  Normal without inguinal adenopathy  External genitalia:  Normal  BUS/Urethra/Skene's glands:  Normal  Vagina:  Normal  Cervix:  Normal  Uterus:  normal in size, shape and contour.  Midline and mobile  Adnexa/parametria:     Rt: Without masses or tenderness.   Lt: Without  masses or tenderness.  Anus and perineum: Normal  Digital rectal exam: Normal sphincter tone without palpated masses or tenderness  Assessment/Plan:  72 y.o. MWF for breast and pelvic exam with no complaints.  Postmenopausal/no HRT/no bleeding Hypertension/hypothyroidism-primary care manages labs and meds Osteoporosis T score stable  Plan: Continue healthy lifestyle with regular exercise, healthy diet.  Home safety, fall prevention and importance of weightbearing/balance type exercise reviewed and encouraged.  Works with a Clinical research associate.  P DEXA next year.  SBE's, annual screening mammogram has scheduled.  Encouraged to schedule repeat screening colonoscopy.  Pap screening guidelines reviewed no Pap done.     [place], 1:01 PM 03/20/2018

## 2018-03-20 NOTE — Patient Instructions (Signed)
Health Maintenance for Postmenopausal Women Menopause is a normal process in which your reproductive ability comes to an end. This process happens gradually over a span of months to years, usually between the ages of 72 and 72. Menopause is complete when you have missed 12 consecutive menstrual periods. It is important to talk with your health care provider about some of the most common conditions that affect postmenopausal women, such as heart disease, cancer, and bone loss (osteoporosis). Adopting a healthy lifestyle and getting preventive care can help to promote your health and wellness. Those actions can also lower your chances of developing some of these common conditions. What should I know about menopause? During menopause, you may experience a number of symptoms, such as:  Moderate-to-severe hot flashes.  Night sweats.  Decrease in sex drive.  Mood swings.  Headaches.  Tiredness.  Irritability.  Memory problems.  Insomnia.  Choosing to treat or not to treat menopausal changes is an individual decision that you make with your health care provider. What should I know about hormone replacement therapy and supplements? Hormone therapy products are effective for treating symptoms that are associated with menopause, such as hot flashes and night sweats. Hormone replacement carries certain risks, especially as you become older. If you are thinking about using estrogen or estrogen with progestin treatments, discuss the benefits and risks with your health care provider. What should I know about heart disease and stroke? Heart disease, heart attack, and stroke become more likely as you age. This may be due, in part, to the hormonal changes that your body experiences during menopause. These can affect how your body processes dietary fats, triglycerides, and cholesterol. Heart attack and stroke are both medical emergencies. There are many things that you can do to help prevent heart disease  and stroke:  Have your blood pressure checked at least every 1-2 years. High blood pressure causes heart disease and increases the risk of stroke.  If you are 72-72 years old, ask your health care provider if you should take aspirin to prevent a heart attack or a stroke.  Do not use any tobacco products, including cigarettes, chewing tobacco, or electronic cigarettes. If you need help quitting, ask your health care provider.  It is important to eat a healthy diet and maintain a healthy weight. ? Be sure to include plenty of vegetables, fruits, low-fat dairy products, and lean protein. ? Avoid eating foods that are high in solid fats, added sugars, or salt (sodium).  Get regular exercise. This is one of the most important things that you can do for your health. ? Try to exercise for at least 150 minutes each week. The type of exercise that you do should increase your heart rate and make you sweat. This is known as moderate-intensity exercise. ? Try to do strengthening exercises at least twice each week. Do these in addition to the moderate-intensity exercise.  Know your numbers.Ask your health care provider to check your cholesterol and your blood glucose. Continue to have your blood tested as directed by your health care provider.  What should I know about cancer screening? There are several types of cancer. Take the following steps to reduce your risk and to catch any cancer development as early as possible. Breast Cancer  Practice breast self-awareness. ? This means understanding how your breasts normally appear and feel. ? It also means doing regular breast self-exams. Let your health care provider know about any changes, no matter how small.  If you are 72  or older, have a clinician do a breast exam (clinical breast exam or CBE) every year. Depending on your age, family history, and medical history, it may be recommended that you also have a yearly breast X-ray (mammogram).  If you  have a family history of breast cancer, talk with your health care provider about genetic screening.  If you are at high risk for breast cancer, talk with your health care provider about having an MRI and a mammogram every year.  Breast cancer (BRCA) gene test is recommended for women who have family members with BRCA-related cancers. Results of the assessment will determine the need for genetic counseling and BRCA1 and for BRCA2 testing. BRCA-related cancers include these types: ? Breast. This occurs in males or females. ? Ovarian. ? Tubal. This may also be called fallopian tube cancer. ? Cancer of the abdominal or pelvic lining (peritoneal cancer). ? Prostate. ? Pancreatic.  Cervical, Uterine, and Ovarian Cancer Your health care provider may recommend that you be screened regularly for cancer of the pelvic organs. These include your ovaries, uterus, and vagina. This screening involves a pelvic exam, which includes checking for microscopic changes to the surface of your cervix (Pap test).  For women ages 72-72, health care providers may recommend a pelvic exam and a Pap test every three years. For women ages 72-72, they may recommend the Pap test and pelvic exam, combined with testing for human papilloma virus (HPV), every five years. Some types of HPV increase your risk of cervical cancer. Testing for HPV may also be done on women of any age who have unclear Pap test results.  Other health care providers may not recommend any screening for nonpregnant women who are considered low risk for pelvic cancer and have no symptoms. Ask your health care provider if a screening pelvic exam is right for you.  If you have had past treatment for cervical cancer or a condition that could lead to cancer, you need Pap tests and screening for cancer for at least 20 years after your treatment. If Pap tests have been discontinued for you, your risk factors (such as having a new sexual partner) need to be  reassessed to determine if you should start having screenings again. Some women have medical problems that increase the chance of getting cervical cancer. In these cases, your health care provider may recommend that you have screening and Pap tests more often.  If you have a family history of uterine cancer or ovarian cancer, talk with your health care provider about genetic screening.  If you have vaginal bleeding after reaching menopause, tell your health care provider.  There are currently no reliable tests available to screen for ovarian cancer.  Lung Cancer Lung cancer screening is recommended for adults 69-62 years old who are at high risk for lung cancer because of a history of smoking. A yearly low-dose CT scan of the lungs is recommended if you:  Currently smoke.  Have a history of at least 30 pack-years of smoking and you currently smoke or have quit within the past 15 years. A pack-year is smoking an average of one pack of cigarettes per day for one year.  Yearly screening should:  Continue until it has been 15 years since you quit.  Stop if you develop a health problem that would prevent you from having lung cancer treatment.  Colorectal Cancer  This type of cancer can be detected and can often be prevented.  Routine colorectal cancer screening usually begins at  age 42 and continues through age 45.  If you have risk factors for colon cancer, your health care provider may recommend that you be screened at an earlier age.  If you have a family history of colorectal cancer, talk with your health care provider about genetic screening.  Your health care provider may also recommend using home test kits to check for hidden blood in your stool.  A small camera at the end of a tube can be used to examine your colon directly (sigmoidoscopy or colonoscopy). This is done to check for the earliest forms of colorectal cancer.  Direct examination of the colon should be repeated every  5-10 years until age 71. However, if early forms of precancerous polyps or small growths are found or if you have a family history or genetic risk for colorectal cancer, you may need to be screened more often.  Skin Cancer  Check your skin from head to toe regularly.  Monitor any moles. Be sure to tell your health care provider: ? About any new moles or changes in moles, especially if there is a change in a mole's shape or color. ? If you have a mole that is larger than the size of a pencil eraser.  If any of your family members has a history of skin cancer, especially at a  age, talk with your health care provider about genetic screening.  Always use sunscreen. Apply sunscreen liberally and repeatedly throughout the day.  Whenever you are outside, protect yourself by wearing long sleeves, pants, a wide-brimmed hat, and sunglasses.  What should I know about osteoporosis? Osteoporosis is a condition in which bone destruction happens more quickly than new bone creation. After menopause, you may be at an increased risk for osteoporosis. To help prevent osteoporosis or the bone fractures that can happen because of osteoporosis, the following is recommended:  If you are 46-71 years old, get at least 1,000 mg of calcium and at least 600 mg of vitamin D per day.  If you are older than age 55 but er than age 65, get at least 1,200 mg of calcium and at least 600 mg of vitamin D per day.  If you are older than age 54, get at least 1,200 mg of calcium and at least 800 mg of vitamin D per day.  Smoking and excessive alcohol intake increase the risk of osteoporosis. Eat foods that are rich in calcium and vitamin D, and do weight-bearing exercises several times each week as directed by your health care provider. What should I know about how menopause affects my mental health? Depression may occur at any age, but it is more common as you become older. Common symptoms of depression  include:  Low or sad mood.  Changes in sleep patterns.  Changes in appetite or eating patterns.  Feeling an overall lack of motivation or enjoyment of activities that you previously enjoyed.  Frequent crying spells.  Talk with your health care provider if you think that you are experiencing depression. What should I know about immunizations? It is important that you get and maintain your immunizations. These include:  Tetanus, diphtheria, and pertussis (Tdap) booster vaccine.  Influenza every year before the flu season begins.  Pneumonia vaccine.  Shingles vaccine.  Your health care provider may also recommend other immunizations. This information is not intended to replace advice given to you by your health care provider. Make sure you discuss any questions you have with your health care provider. Document Released: 01/27/2006  Document Revised: 06/24/2016 Document Reviewed: 09/08/2015 Elsevier Interactive Patient Education  2018 [employer]  

## 2018-03-24 ENCOUNTER — Other Ambulatory Visit: Payer: Self-pay | Admitting: Internal Medicine

## 2018-03-24 NOTE — Addendum Note (Signed)
Addended by: Alen Blew on: 03/24/2018 04:04 PM   Modules accepted: Level of Service

## 2018-04-10 ENCOUNTER — Ambulatory Visit
Admission: RE | Admit: 2018-04-10 | Discharge: 2018-04-10 | Disposition: A | Discharge status: Home / Self Care | Payer: Medicare Other | Source: Ambulatory Visit | Attending: Gynecology | Admitting: Gynecology

## 2018-04-10 ENCOUNTER — Telehealth: Payer: Self-pay

## 2018-04-10 DIAGNOSIS — Z1231 Encounter for screening mammogram for malignant neoplasm of breast: Secondary | ICD-10-CM | POA: Diagnosis not present

## 2018-04-10 NOTE — Telephone Encounter (Signed)
Key: [id number]   Request for Zolpidem started on Cover [facility] today.

## 2018-05-02 ENCOUNTER — Encounter: Payer: Self-pay | Admitting: Internal Medicine

## 2018-05-02 ENCOUNTER — Ambulatory Visit (INDEPENDENT_AMBULATORY_CARE_PROVIDER_SITE_OTHER): Payer: Medicare Other | Admitting: Internal Medicine

## 2018-05-02 DIAGNOSIS — I10 Essential (primary) hypertension: Secondary | ICD-10-CM | POA: Diagnosis not present

## 2018-05-02 DIAGNOSIS — E039 Hypothyroidism, unspecified: Secondary | ICD-10-CM | POA: Diagnosis not present

## 2018-05-02 DIAGNOSIS — F4323 Adjustment disorder with mixed anxiety and depressed mood: Secondary | ICD-10-CM

## 2018-05-02 DIAGNOSIS — G4700 Insomnia, unspecified: Secondary | ICD-10-CM | POA: Insufficient documentation

## 2018-05-02 DIAGNOSIS — F5101 Primary insomnia: Secondary | ICD-10-CM

## 2018-05-02 MED ORDER — ZOLPIDEM TARTRATE ER 6.25 MG PO TBCR: 6.2500 mg | EXTENDED_RELEASE_TABLET | Freq: Every evening | ORAL | 5 refills | Status: DC | PRN

## 2018-05-02 MED ORDER — ONDANSETRON HCL 4 MG PO TABS: 4.0000 mg | ORAL_TABLET | Freq: Three times a day (TID) | ORAL | 0 refills | Status: DC | PRN

## 2018-05-02 MED ORDER — ESCITALOPRAM OXALATE 5 MG PO TABS: 5.0000 mg | ORAL_TABLET | Freq: Every day | ORAL | 3 refills | Status: DC

## 2018-05-02 NOTE — Progress Notes (Signed)
Subjective:  Patient ID: Dawn Tucker, female    DOB: May 09, 1946  Age: 72 y.o. MRN: [id number]  CC: No chief complaint on file.   HPI Dawn Tucker presents for anxiety, depression, insomnia, abn TSH f/u Had a stomach bug  Outpatient Medications Prior to Visit  Medication Sig Dispense Refill  . ALPRAZolam (XANAX) 0.25 MG tablet Take 1 tablet (0.25 mg total) by mouth 2 (two) times daily as needed for anxiety. 60 tablet 2  . atorvastatin (LIPITOR) 10 MG tablet Take 1 tablet (10 mg total) daily by mouth. 90 tablet 3  . Calcium-Vitamin D-Vitamin K [id number] MG-UNT-MCG CHEW Chew by mouth daily. Reported on 01/26/2016    . dexlansoprazole (DEXILANT) 60 MG capsule TAKE 1 CAPSULE BY MOUTH DAILY 90 capsule 3  . diclofenac sodium (VOLTAREN) 1 % GEL Apply 1 application topically 4 (four) times daily. 100 g 0  . Estradiol (VAGIFEM) 10 MCG TABS vaginal tablet INSERT ONE TABLET VAGINALLY TWICE WEEKLY 8 tablet 12  . fluconazole (DIFLUCAN) 150 MG tablet Take 1 tablet (150 mg total) by mouth once. 1 tablet 1  . losartan (COZAAR) 50 MG tablet Take 0.5 tablets (25 mg total) daily by mouth. 90 tablet 3  . Multiple Vitamin (MULTIVITAMIN) tablet Take 1 tablet by mouth daily.      Marland Kitchen nystatin-triamcinolone ointment (MYCOLOG) Apply topically 2 (two) times daily as needed.    . ranitidine (ZANTAC) 150 MG tablet Take 150 mg by mouth as needed.      . simethicone (MYLICON) 433 MG chewable tablet Chew 125 mg by mouth every 6 (six) hours as needed.      Marland Kitchen SYNTHROID 88 MCG tablet Take 1 tablet (88 mcg total) daily before breakfast by mouth. 90 tablet 3  . valACYclovir (VALTREX) 1000 MG tablet TAKE 1 TABLET(1000 MG) BY MOUTH TWICE DAILY 30 tablet 0  . YUVAFEM 10 MCG TABS vaginal tablet INSERT 1 TABLET VAGINALLY TWICE WEEKLY 8 tablet 11  . escitalopram (LEXAPRO) 5 MG tablet Take 1 tablet (5 mg total) daily by mouth. 90 tablet 1  . zolpidem (AMBIEN CR) 6.25 MG CR tablet TAKE 1 TABLET BY MOUTH AT BEDTIME AS  NEEDED FOR SLEEP 30 tablet 5   No facility-administered medications prior to visit.     ROS Review of Systems  Constitutional: Negative for activity change, appetite change, chills, fatigue and unexpected weight change.  HENT: Negative for congestion, mouth sores and sinus pressure.   Eyes: Negative for visual disturbance.  Respiratory: Negative for cough and chest tightness.   Gastrointestinal: Positive for nausea. Negative for abdominal pain.  Genitourinary: Negative for difficulty urinating, frequency and vaginal pain.  Musculoskeletal: Negative for back pain and gait problem.  Skin: Negative for pallor and rash.  Neurological: Negative for dizziness, tremors, weakness, numbness and headaches.  Psychiatric/Behavioral: Positive for sleep disturbance. Negative for confusion. The patient is nervous/anxious.     Objective:  BP 116/68 (BP Location: Left Arm, Patient Position: Sitting, Cuff Size: Normal)   Pulse (!) 58   Temp 97.9 F (36.6 C) (Oral)   Ht 5\' 3"  (1.6 m)   Wt 110 lb (49.9 kg)   SpO2 99%   BMI 19.49 kg/m   BP Readings from Last 3 Encounters:  05/02/18 116/68  03/20/18 121/81  10/31/17 118/74    Wt Readings from Last 3 Encounters:  05/02/18 110 lb (49.9 kg)  03/20/18 113 lb (51.3 kg)  10/31/17 113 lb (51.3 kg)    Physical Exam  Constitutional: She appears  well-developed. No distress.  HENT:  Head: Normocephalic.  Right Ear: External ear normal.  Left Ear: External ear normal.  Nose: Nose normal.  Mouth/Throat: Oropharynx is clear and moist.  Eyes: Pupils are equal, round, and reactive to light. Conjunctivae are normal. Right eye exhibits no discharge. Left eye exhibits no discharge.  Neck: Normal range of motion. Neck supple. No JVD present. No tracheal deviation present. No thyromegaly present.  Cardiovascular: Normal rate, regular rhythm and normal heart sounds.  Pulmonary/Chest: No stridor. No respiratory distress. She has no wheezes.  Abdominal:  Soft. Bowel sounds are normal. She exhibits no distension and no mass. There is no tenderness. There is no rebound and no guarding.  Musculoskeletal: She exhibits no edema or tenderness.  Lymphadenopathy:    She has no cervical adenopathy.  Neurological: She displays normal reflexes. No cranial nerve deficit. She exhibits normal muscle tone. Coordination normal.  Skin: No rash noted. No erythema.  Psychiatric: She has a normal mood and affect. Her behavior is normal. Judgment and thought content normal.    Lab Results  Component Value Date   WBC 5.6 10/31/2017   HGB 12.9 10/31/2017   HCT 39.3 10/31/2017   PLT 230.0 10/31/2017   GLUCOSE 93 10/31/2017   CHOL 183 10/31/2017   TRIG 94.0 10/31/2017   HDL 69.60 10/31/2017   LDLCALC 95 10/31/2017   ALT 21 10/31/2017   AST 21 10/31/2017   NA 139 10/31/2017   K 4.7 10/31/2017   CL 103 10/31/2017   CREATININE 1.01 10/31/2017   BUN 17 10/31/2017   CO2 32 10/31/2017   TSH 0.05 (L) 10/31/2017   HGBA1C 5.9 10/02/2015    Mm Screening Breast Tomo Bilateral  Result Date: 04/10/2018 CLINICAL DATA:  Screening. EXAM: DIGITAL SCREENING BILATERAL MAMMOGRAM WITH TOMO AND CAD COMPARISON:  Previous exam(s). ACR Breast Density Category b: There are scattered areas of fibroglandular density. FINDINGS: There are no findings suspicious for malignancy. Images were processed with CAD. IMPRESSION: No mammographic evidence of malignancy. A result letter of this screening mammogram will be mailed directly to the patient. RECOMMENDATION: Screening mammogram in one year. (Code:SM-B-01Y) BI-RADS CATEGORY  1: Negative. Electronically Signed   By: Abelardo Diesel M.D.   On: 04/10/2018 14:43    Assessment & Plan:   There are no diagnoses linked to this encounter. I have changed Dawn Cruz L. Kapaun "Twyla Lee"'s escitalopram and zolpidem. I am also having her start on ondansetron. Additionally, I am having her maintain her Calcium-Vitamin D-Vitamin K, simethicone,  multivitamin, ranitidine, nystatin-triamcinolone ointment, Estradiol, fluconazole, YUVAFEM, ALPRAZolam, valACYclovir, SYNTHROID, atorvastatin, dexlansoprazole, losartan, and diclofenac sodium.  Meds ordered this encounter  Medications  . escitalopram (LEXAPRO) 5 MG tablet    Sig: Take 1 tablet (5 mg total) by mouth daily.    Dispense:  90 tablet    Refill:  3  . zolpidem (AMBIEN CR) 6.25 MG CR tablet    Sig: Take 1 tablet (6.25 mg total) by mouth at bedtime as needed. for sleep    Dispense:  30 tablet    Refill:  5  . ondansetron (ZOFRAN) 4 MG tablet    Sig: Take 1 tablet (4 mg total) by mouth every 8 (eight) hours as needed for nausea or vomiting.    Dispense:  20 tablet    Refill:  0     Follow-up: No follow-ups on file.  Walker Kehr, MD

## 2018-05-02 NOTE — Assessment & Plan Note (Signed)
Chronic  Zolpidem prn  Potential benefits of a long term benzodiazepines  use as well as potential risks  and complications were explained to the patient and were aknowledged. 

## 2018-05-02 NOTE — Assessment & Plan Note (Signed)
Losartan 

## 2018-05-02 NOTE — Assessment & Plan Note (Signed)
Lexapro 

## 2018-05-02 NOTE — Assessment & Plan Note (Signed)
Labs

## 2018-05-08 NOTE — Telephone Encounter (Signed)
PA denied.

## 2018-06-04 ENCOUNTER — Other Ambulatory Visit (INDEPENDENT_AMBULATORY_CARE_PROVIDER_SITE_OTHER): Payer: Medicare Other

## 2018-06-04 DIAGNOSIS — E039 Hypothyroidism, unspecified: Secondary | ICD-10-CM

## 2018-06-04 DIAGNOSIS — I10 Essential (primary) hypertension: Secondary | ICD-10-CM

## 2018-06-04 LAB — HEPATIC FUNCTION PANEL
ALBUMIN: 4.4 g/dL (ref 3.5–5.2)
ALT: 33 U/L (ref 0–35)
AST: 27 U/L (ref 0–37)
Alkaline Phosphatase: 74 U/L (ref 39–117)
Bilirubin, Direct: 0.1 mg/dL (ref 0.0–0.3)
Total Bilirubin: 0.5 mg/dL (ref 0.2–1.2)
Total Protein: 7.4 g/dL (ref 6.0–8.3)

## 2018-06-04 LAB — LIPID PANEL
CHOL/HDL RATIO: 3
Cholesterol: 187 mg/dL (ref 0–200)
HDL: 72.2 mg/dL (ref >39.00)
LDL CALC: 94 mg/dL (ref 0–99)
NONHDL: 114.68
Triglycerides: 105 mg/dL (ref 0.0–149.0)
VLDL: 21 mg/dL (ref 0.0–40.0)

## 2018-06-04 LAB — CBC WITH DIFFERENTIAL/PLATELET
BASOS ABS: 0 10*3/uL (ref 0.0–0.1)
Basophils Relative: 0.7 % (ref 0.0–3.0)
EOS ABS: 0.1 10*3/uL (ref 0.0–0.7)
Eosinophils Relative: 2.5 % (ref 0.0–5.0)
HCT: 37.4 % (ref 36.0–46.0)
Hemoglobin: 12.6 g/dL (ref 12.0–15.0)
LYMPHS ABS: 1.6 10*3/uL (ref 0.7–4.0)
Lymphocytes Relative: 28.9 % (ref 12.0–46.0)
MCHC: 33.8 g/dL (ref 30.0–36.0)
MCV: 87.4 fl (ref 78.0–100.0)
Monocytes Absolute: 0.6 10*3/uL (ref 0.1–1.0)
Monocytes Relative: 10.4 % (ref 3.0–12.0)
NEUTROS PCT: 57.5 % (ref 43.0–77.0)
Neutro Abs: 3.2 10*3/uL (ref 1.4–7.7)
PLATELETS: 237 10*3/uL (ref 150.0–400.0)
RBC: 4.28 Mil/uL (ref 3.87–5.11)
RDW: 13.7 % (ref 11.5–15.5)
WBC: 5.6 10*3/uL (ref 4.0–10.5)

## 2018-06-04 LAB — T4, FREE: Free T4: 1.1 ng/dL (ref 0.60–1.60)

## 2018-06-04 LAB — BASIC METABOLIC PANEL
BUN: 19 mg/dL (ref 6–23)
CALCIUM: 9.5 mg/dL (ref 8.4–10.5)
CHLORIDE: 100 meq/L (ref 96–112)
CO2: 31 meq/L (ref 19–32)
Creatinine, Ser: 1.06 mg/dL (ref 0.40–1.20)
GFR: 54.22 mL/min — ABNORMAL LOW (ref >60.00)
GLUCOSE: 96 mg/dL (ref 70–99)
Potassium: 4.3 mEq/L (ref 3.5–5.1)
SODIUM: 138 meq/L (ref 135–145)

## 2018-06-04 LAB — URINALYSIS
BILIRUBIN URINE: NEGATIVE
HGB URINE DIPSTICK: NEGATIVE
KETONES UR: NEGATIVE
Leukocytes, UA: NEGATIVE
Nitrite: NEGATIVE
Specific Gravity, Urine: <=1.005 — AB (ref 1.000–1.030)
Total Protein, Urine: NEGATIVE
URINE GLUCOSE: NEGATIVE
UROBILINOGEN UA: 0.2 (ref 0.0–1.0)
pH: 6.5 (ref 5.0–8.0)

## 2018-06-04 LAB — TSH: TSH: 0.1 u[IU]/mL — AB (ref 0.35–4.50)

## 2018-06-19 ENCOUNTER — Other Ambulatory Visit: Payer: Self-pay | Admitting: *Deleted

## 2018-06-19 DIAGNOSIS — L245 Irritant contact dermatitis due to other chemical products: Secondary | ICD-10-CM | POA: Diagnosis not present

## 2018-06-19 MED ORDER — VALACYCLOVIR HCL 1 G PO TABS: 1000.0000 mg | ORAL_TABLET | Freq: Two times a day (BID) | ORAL | 0 refills | Status: DC

## 2018-07-10 DIAGNOSIS — L245 Irritant contact dermatitis due to other chemical products: Secondary | ICD-10-CM | POA: Diagnosis not present

## 2018-07-16 DIAGNOSIS — L259 Unspecified contact dermatitis, unspecified cause: Secondary | ICD-10-CM | POA: Diagnosis not present

## 2018-07-16 DIAGNOSIS — L219 Seborrheic dermatitis, unspecified: Secondary | ICD-10-CM | POA: Diagnosis not present

## 2018-08-01 ENCOUNTER — Other Ambulatory Visit: Payer: Self-pay

## 2018-08-01 DIAGNOSIS — H04123 Dry eye syndrome of bilateral lacrimal glands: Secondary | ICD-10-CM | POA: Diagnosis not present

## 2018-08-01 DIAGNOSIS — H5203 Hypermetropia, bilateral: Secondary | ICD-10-CM | POA: Diagnosis not present

## 2018-08-01 DIAGNOSIS — H353111 Nonexudative age-related macular degeneration, right eye, early dry stage: Secondary | ICD-10-CM | POA: Diagnosis not present

## 2018-08-01 DIAGNOSIS — H25813 Combined forms of age-related cataract, bilateral: Secondary | ICD-10-CM | POA: Diagnosis not present

## 2018-08-01 MED ORDER — DEXLANSOPRAZOLE 60 MG PO CPDR: DELAYED_RELEASE_CAPSULE | ORAL | 3 refills | Status: DC

## 2018-08-15 DIAGNOSIS — D225 Melanocytic nevi of trunk: Secondary | ICD-10-CM | POA: Diagnosis not present

## 2018-08-15 DIAGNOSIS — L249 Irritant contact dermatitis, unspecified cause: Secondary | ICD-10-CM | POA: Diagnosis not present

## 2018-08-15 DIAGNOSIS — L821 Other seborrheic keratosis: Secondary | ICD-10-CM | POA: Diagnosis not present

## 2018-08-15 DIAGNOSIS — D2272 Melanocytic nevi of left lower limb, including hip: Secondary | ICD-10-CM | POA: Diagnosis not present

## 2018-09-12 ENCOUNTER — Ambulatory Visit (INDEPENDENT_AMBULATORY_CARE_PROVIDER_SITE_OTHER): Payer: Medicare Other

## 2018-09-12 DIAGNOSIS — Z23 Encounter for immunization: Secondary | ICD-10-CM

## 2018-10-25 ENCOUNTER — Other Ambulatory Visit: Payer: Self-pay

## 2018-10-25 MED ORDER — SYNTHROID 88 MCG PO TABS: 88.0000 ug | ORAL_TABLET | Freq: Every day | ORAL | 3 refills | Status: DC

## 2018-10-26 ENCOUNTER — Other Ambulatory Visit: Payer: Self-pay | Admitting: Internal Medicine

## 2018-10-26 DIAGNOSIS — E039 Hypothyroidism, unspecified: Secondary | ICD-10-CM

## 2018-11-05 ENCOUNTER — Ambulatory Visit (INDEPENDENT_AMBULATORY_CARE_PROVIDER_SITE_OTHER): Payer: Medicare Other | Admitting: Internal Medicine

## 2018-11-05 ENCOUNTER — Encounter: Payer: Self-pay | Admitting: Internal Medicine

## 2018-11-05 ENCOUNTER — Other Ambulatory Visit (INDEPENDENT_AMBULATORY_CARE_PROVIDER_SITE_OTHER): Payer: Medicare Other

## 2018-11-05 VITALS — BP 118/72 | HR 64 | Temp 98.0°F | Ht 63.0 in | Wt 114.0 lb

## 2018-11-05 DIAGNOSIS — E039 Hypothyroidism, unspecified: Secondary | ICD-10-CM

## 2018-11-05 DIAGNOSIS — F5101 Primary insomnia: Secondary | ICD-10-CM | POA: Diagnosis not present

## 2018-11-05 DIAGNOSIS — Z1211 Encounter for screening for malignant neoplasm of colon: Secondary | ICD-10-CM | POA: Diagnosis not present

## 2018-11-05 DIAGNOSIS — E782 Mixed hyperlipidemia: Secondary | ICD-10-CM | POA: Diagnosis not present

## 2018-11-05 LAB — T4, FREE: Free T4: 1.24 ng/dL (ref 0.60–1.60)

## 2018-11-05 LAB — BASIC METABOLIC PANEL
BUN: 22 mg/dL (ref 6–23)
CHLORIDE: 103 meq/L (ref 96–112)
CO2: 29 mEq/L (ref 19–32)
CREATININE: 1.02 mg/dL (ref 0.40–1.20)
Calcium: 9.1 mg/dL (ref 8.4–10.5)
GFR: 56.61 mL/min — ABNORMAL LOW (ref >60.00)
GLUCOSE: 97 mg/dL (ref 70–99)
POTASSIUM: 4 meq/L (ref 3.5–5.1)
Sodium: 140 mEq/L (ref 135–145)

## 2018-11-05 LAB — TSH: TSH: 0.03 u[IU]/mL — ABNORMAL LOW (ref 0.35–4.50)

## 2018-11-05 MED ORDER — LOSARTAN POTASSIUM 50 MG PO TABS: 25.0000 mg | ORAL_TABLET | Freq: Every day | ORAL | 3 refills | Status: DC

## 2018-11-05 MED ORDER — DICLOFENAC SODIUM 1 % TD GEL: 1.0000 "application " | Freq: Four times a day (QID) | TRANSDERMAL | 0 refills | Status: DC

## 2018-11-05 MED ORDER — ZOLPIDEM TARTRATE ER 6.25 MG PO TBCR: 6.2500 mg | EXTENDED_RELEASE_TABLET | Freq: Every evening | ORAL | 5 refills | Status: DC | PRN

## 2018-11-05 MED ORDER — SYNTHROID 88 MCG PO TABS: 88.0000 ug | ORAL_TABLET | Freq: Every day | ORAL | 3 refills | Status: DC

## 2018-11-05 MED ORDER — ATORVASTATIN CALCIUM 10 MG PO TABS: 10.0000 mg | ORAL_TABLET | Freq: Every day | ORAL | 3 refills | Status: DC

## 2018-11-05 NOTE — Assessment & Plan Note (Signed)
Zolpidem prn  Potential benefits of a long term benzodiazepines  use as well as potential risks  and complications were explained to the patient and were aknowledged. 

## 2018-11-05 NOTE — Progress Notes (Signed)
Subjective:  Patient ID: Dawn Tucker, female    DOB: [birth year]  Age: 72 y.o. MRN: [id number]  CC: No chief complaint on file.   HPI Dawn Tucker presents for anxiety, insomnia, HTN, hypothyroidism f/u  Outpatient Medications Prior to Visit  Medication Sig Dispense Refill  . ALPRAZolam (XANAX) 0.25 MG tablet Take 1 tablet (0.25 mg total) by mouth 2 (two) times daily as needed for anxiety. 60 tablet 2  . atorvastatin (LIPITOR) 10 MG tablet Take 1 tablet (10 mg total) daily by mouth. 90 tablet 3  . Calcium-Vitamin D-Vitamin K [id number] MG-UNT-MCG CHEW Chew by mouth daily. Reported on 01/26/2016    . dexlansoprazole (DEXILANT) 60 MG capsule TAKE 1 CAPSULE BY MOUTH DAILY 90 capsule 3  . diclofenac sodium (VOLTAREN) 1 % GEL Apply 1 application topically 4 (four) times daily. 100 g 0  . escitalopram (LEXAPRO) 5 MG tablet Take 1 tablet (5 mg total) by mouth daily. 90 tablet 3  . Estradiol (VAGIFEM) 10 MCG TABS vaginal tablet INSERT ONE TABLET VAGINALLY TWICE WEEKLY 8 tablet 12  . fluconazole (DIFLUCAN) 150 MG tablet Take 1 tablet (150 mg total) by mouth once. 1 tablet 1  . losartan (COZAAR) 50 MG tablet Take 0.5 tablets (25 mg total) daily by mouth. 90 tablet 3  . Multiple Vitamin (MULTIVITAMIN) tablet Take 1 tablet by mouth daily.      Marland Kitchen nystatin-triamcinolone ointment (MYCOLOG) Apply topically 2 (two) times daily as needed.    . ondansetron (ZOFRAN) 4 MG tablet Take 1 tablet (4 mg total) by mouth every 8 (eight) hours as needed for nausea or vomiting. 20 tablet 0  . ranitidine (ZANTAC) 150 MG tablet Take 150 mg by mouth as needed.      . simethicone (MYLICON) 283 MG chewable tablet Chew 125 mg by mouth every 6 (six) hours as needed.      Marland Kitchen SYNTHROID 88 MCG tablet Take 1 tablet (88 mcg total) by mouth daily before breakfast. 90 tablet 3  . valACYclovir (VALTREX) 1000 MG tablet Take 1 tablet (1,000 mg total) by mouth 2 (two) times daily. 60 tablet 0  . YUVAFEM 10 MCG TABS vaginal  tablet INSERT 1 TABLET VAGINALLY TWICE WEEKLY 8 tablet 11  . zolpidem (AMBIEN CR) 6.25 MG CR tablet Take 1 tablet (6.25 mg total) by mouth at bedtime as needed. for sleep 30 tablet 5   No facility-administered medications prior to visit.     ROS: Review of Systems  Constitutional: Negative for activity change, appetite change, chills, fatigue and unexpected weight change.  HENT: Negative for congestion, mouth sores and sinus pressure.   Eyes: Negative for visual disturbance.  Respiratory: Negative for cough and chest tightness.   Gastrointestinal: Negative for abdominal pain and nausea.  Genitourinary: Negative for difficulty urinating, frequency and vaginal pain.  Musculoskeletal: Negative for back pain and gait problem.  Skin: Negative for pallor and rash.  Neurological: Negative for dizziness, tremors, weakness, numbness and headaches.  Psychiatric/Behavioral: Positive for sleep disturbance. Negative for confusion and suicidal ideas. The patient is nervous/anxious.     Objective:  BP 118/72 (BP Location: Left Arm, Patient Position: Sitting, Cuff Size: Normal)   Pulse 64   Temp 98 F (36.7 C) (Oral)   Ht 5\' 3"  (1.6 m)   Wt 114 lb (51.7 kg)   SpO2 99%   BMI 20.19 kg/m   BP Readings from Last 3 Encounters:  11/05/18 118/72  05/02/18 116/68  03/20/18 121/81    Wt Readings  from Last 3 Encounters:  11/05/18 114 lb (51.7 kg)  05/02/18 110 lb (49.9 kg)  03/20/18 113 lb (51.3 kg)    Physical Exam  Constitutional: She appears well-developed. No distress.  HENT:  Head: Normocephalic.  Right Ear: External ear normal.  Left Ear: External ear normal.  Nose: Nose normal.  Mouth/Throat: Oropharynx is clear and moist.  Eyes: Pupils are equal, round, and reactive to light. Conjunctivae are normal. Right eye exhibits no discharge. Left eye exhibits no discharge.  Neck: Normal range of motion. Neck supple. No JVD present. No tracheal deviation present. No thyromegaly present.    Cardiovascular: Normal rate, regular rhythm and normal heart sounds.  Pulmonary/Chest: No stridor. No respiratory distress. She has no wheezes.  Abdominal: Soft. Bowel sounds are normal. She exhibits no distension and no mass. There is no tenderness. There is no rebound and no guarding.  Musculoskeletal: She exhibits no edema or tenderness.  Lymphadenopathy:    She has no cervical adenopathy.  Neurological: She displays normal reflexes. No cranial nerve deficit. She exhibits normal muscle tone. Coordination normal.  Skin: No rash noted. No erythema.  Psychiatric: She has a normal mood and affect. Her behavior is normal. Judgment and thought content normal.  Hands w/some pain  Lab Results  Component Value Date   WBC 5.6 06/04/2018   HGB 12.6 06/04/2018   HCT 37.4 06/04/2018   PLT 237.0 06/04/2018   GLUCOSE 96 06/04/2018   CHOL 187 06/04/2018   TRIG 105.0 06/04/2018   HDL 72.20 06/04/2018   LDLCALC 94 06/04/2018   ALT 33 06/04/2018   AST 27 06/04/2018   NA 138 06/04/2018   K 4.3 06/04/2018   CL 100 06/04/2018   CREATININE 1.06 06/04/2018   BUN 19 06/04/2018   CO2 31 06/04/2018   TSH 0.10 (L) 06/04/2018   HGBA1C 5.9 10/02/2015    Mm Screening Breast Tomo Bilateral  Result Date: 04/10/2018 CLINICAL DATA:  Screening. EXAM: DIGITAL SCREENING BILATERAL MAMMOGRAM WITH TOMO AND CAD COMPARISON:  Previous exam(s). ACR Breast Density Category b: There are scattered areas of fibroglandular density. FINDINGS: There are no findings suspicious for malignancy. Images were processed with CAD. IMPRESSION: No mammographic evidence of malignancy. A result letter of this screening mammogram will be mailed directly to the patient. RECOMMENDATION: Screening mammogram in one year. (Code:SM-B-01Y) BI-RADS CATEGORY  1: Negative. Electronically Signed   By: Abelardo Diesel M.D.   On: 04/10/2018 14:43    Assessment & Plan:   There are no diagnoses linked to this encounter.   No orders of the defined  types were placed in this encounter.    Follow-up: No follow-ups on file.  Walker Kehr, MD

## 2018-11-05 NOTE — Patient Instructions (Addendum)

## 2018-11-05 NOTE — Assessment & Plan Note (Signed)
CT Ca score test info

## 2018-11-05 NOTE — Assessment & Plan Note (Signed)
Labs

## 2018-11-19 ENCOUNTER — Telehealth: Payer: Self-pay | Admitting: Internal Medicine

## 2018-11-19 DIAGNOSIS — I10 Essential (primary) hypertension: Secondary | ICD-10-CM

## 2018-11-19 DIAGNOSIS — E782 Mixed hyperlipidemia: Secondary | ICD-10-CM

## 2018-11-19 NOTE — Telephone Encounter (Signed)
Patient is requesting to have the Cardiac CT calcium scoring test $150 done.

## 2018-11-20 NOTE — Telephone Encounter (Signed)
CT ordered. 

## 2018-12-04 ENCOUNTER — Ambulatory Visit (INDEPENDENT_AMBULATORY_CARE_PROVIDER_SITE_OTHER)
Admission: RE | Admit: 2018-12-04 | Discharge: 2018-12-04 | Disposition: A | Discharge status: Home / Self Care | Payer: Self-pay | Source: Ambulatory Visit | Attending: Internal Medicine | Admitting: Internal Medicine

## 2018-12-04 DIAGNOSIS — I10 Essential (primary) hypertension: Secondary | ICD-10-CM

## 2018-12-04 DIAGNOSIS — E782 Mixed hyperlipidemia: Secondary | ICD-10-CM

## 2019-01-28 ENCOUNTER — Encounter: Payer: Self-pay | Admitting: Internal Medicine

## 2019-02-06 DIAGNOSIS — H25813 Combined forms of age-related cataract, bilateral: Secondary | ICD-10-CM | POA: Diagnosis not present

## 2019-02-06 DIAGNOSIS — H353111 Nonexudative age-related macular degeneration, right eye, early dry stage: Secondary | ICD-10-CM | POA: Diagnosis not present

## 2019-02-06 DIAGNOSIS — H04123 Dry eye syndrome of bilateral lacrimal glands: Secondary | ICD-10-CM | POA: Diagnosis not present

## 2019-03-26 ENCOUNTER — Encounter: Payer: Medicare Other | Admitting: Women's Health

## 2019-05-07 ENCOUNTER — Ambulatory Visit: Payer: Medicare Other | Admitting: Internal Medicine

## 2019-05-09 ENCOUNTER — Ambulatory Visit: Payer: Medicare Other | Admitting: Internal Medicine

## 2019-05-29 ENCOUNTER — Other Ambulatory Visit: Payer: Self-pay | Admitting: Gynecology

## 2019-05-29 ENCOUNTER — Encounter: Payer: Self-pay | Admitting: Internal Medicine

## 2019-05-29 DIAGNOSIS — Z1231 Encounter for screening mammogram for malignant neoplasm of breast: Secondary | ICD-10-CM

## 2019-06-04 ENCOUNTER — Other Ambulatory Visit: Payer: Self-pay

## 2019-06-04 ENCOUNTER — Ambulatory Visit (INDEPENDENT_AMBULATORY_CARE_PROVIDER_SITE_OTHER): Payer: Medicare Other | Admitting: Women's Health

## 2019-06-04 ENCOUNTER — Encounter: Payer: Self-pay | Admitting: Women's Health

## 2019-06-04 VITALS — BP 128/82 | Ht 63.0 in | Wt 110.0 lb

## 2019-06-04 DIAGNOSIS — Z01419 Encounter for gynecological examination (general) (routine) without abnormal findings: Secondary | ICD-10-CM | POA: Diagnosis not present

## 2019-06-04 DIAGNOSIS — E2839 Other primary ovarian failure: Secondary | ICD-10-CM

## 2019-06-04 DIAGNOSIS — M810 Age-related osteoporosis without current pathological fracture: Secondary | ICD-10-CM

## 2019-06-04 DIAGNOSIS — Z13820 Encounter for screening for osteoporosis: Secondary | ICD-10-CM

## 2019-06-04 NOTE — Patient Instructions (Signed)
Health Maintenance After Age 73 After age 73, you are at a higher risk for certain long-term diseases and infections as well as injuries from falls. Falls are a major cause of broken bones and head injuries in people who are older than age 73. Getting regular preventive care can help to keep you healthy and well. Preventive care includes getting regular testing and making lifestyle changes as recommended by your health care provider. Talk with your health care provider about:  Which screenings and tests you should have. A screening is a test that checks for a disease when you have no symptoms.  A diet and exercise plan that is right for you. What should I know about screenings and tests to prevent falls? Screening and testing are the best ways to find a health problem early. Early diagnosis and treatment give you the best chance of managing medical conditions that are common after age 73. Certain conditions and lifestyle choices may make you more likely to have a fall. Your health care provider may recommend:  Regular vision checks. Poor vision and conditions such as cataracts can make you more likely to have a fall. If you wear glasses, make sure to get your prescription updated if your vision changes.  Medicine review. Work with your health care provider to regularly review all of the medicines you are taking, including over-the-counter medicines. Ask your health care provider about any side effects that may make you more likely to have a fall. Tell your health care provider if any medicines that you take make you feel dizzy or sleepy.  Osteoporosis screening. Osteoporosis is a condition that causes the bones to get weaker. This can make the bones weak and cause them to break more easily.  Blood pressure screening. Blood pressure changes and medicines to control blood pressure can make you feel dizzy.  Strength and balance checks. Your health care provider may recommend certain tests to check your  strength and balance while standing, walking, or changing positions.  Foot health exam. Foot pain and numbness, as well as not wearing proper footwear, can make you more likely to have a fall.  Depression screening. You may be more likely to have a fall if you have a fear of falling, feel emotionally low, or feel unable to do activities that you used to do.  Alcohol use screening. Using too much alcohol can affect your balance and may make you more likely to have a fall. What actions can I take to lower my risk of falls? General instructions  Talk with your health care provider about your risks for falling. Tell your health care provider if: ? You fall. Be sure to tell your health care provider about all falls, even ones that seem minor. ? You feel dizzy, sleepy, or off-balance.  Take over-the-counter and prescription medicines only as told by your health care provider. These include any supplements.  Eat a healthy diet and maintain a healthy weight. A healthy diet includes low-fat dairy products, low-fat (lean) meats, and fiber from whole grains, beans, and lots of fruits and vegetables. Home safety  Remove any tripping hazards, such as rugs, cords, and clutter.  Install safety equipment such as grab bars in bathrooms and safety rails on stairs.  Keep rooms and walkways well-lit. Activity   Follow a regular exercise program to stay fit. This will help you maintain your balance. Ask your health care provider what types of exercise are appropriate for you.  If you need a cane or   walker, use it as recommended by your health care provider.  Wear supportive shoes that have nonskid soles. Lifestyle  Do not drink alcohol if your health care provider tells you not to drink.  If you drink alcohol, limit how much you have: ? 0-1 drink a day for women. ? 0-2 drinks a day for men.  Be aware of how much alcohol is in your drink. In the [place] one drink equals one typical bottle of beer (12  oz), one-half glass of wine (5 oz), or one shot of hard liquor (1 oz).  Do not use any products that contain nicotine or tobacco, such as cigarettes and e-cigarettes. If you need help quitting, ask your health care provider. Summary  Having a healthy lifestyle and getting preventive care can help to protect your health and wellness after age 73.  Screening and testing are the best way to find a health problem early and help you avoid having a fall. Early diagnosis and treatment give you the best chance for managing medical conditions that are more common for people who are older than age 73.  Falls are a major cause of broken bones and head injuries in people who are older than age 73. Take precautions to prevent a fall at home.  Work with your health care provider to learn what changes you can make to improve your health and wellness and to prevent falls. This information is not intended to replace advice given to you by your health care provider. Make sure you discuss any questions you have with your health care provider. Document Released: 10/18/2017 Document Revised: 10/18/2017 Document Reviewed: 10/18/2017 Elsevier Interactive Patient Education  2019 Elsevier Inc.  

## 2019-06-04 NOTE — Progress Notes (Signed)
Dawn Tucker [birth year] [id number]    History:    Presents for breast and pelvic exam with no complaints.  Postmenopausal on no HRT with no bleeding.  Normal mammogram history.  History of cryo greater than 40 years ago with normal Paps since.  2018 T score -2.6 at the femoral neck stable has completed 5 years of Fosamax.  Primary care manages hypertension, GERD, anxiety/depression, hypothyroidism and hypercholesteremia.  Is scheduled for repeat colonoscopy 06/2019.  Sister breast cancer BRCA  Negative.  Vaccines current.  Past medical history, past surgical history, family history and social history were all reviewed and documented in the EPIC chart.  Husband numerous health problems having home health help with his care.  One daughter and 1 son both live in [place].  ROS:  A ROS was performed and pertinent positives and negatives are included.  Exam:  Vitals:   06/04/19 1138  BP: 128/82  Weight: 110 lb (49.9 kg)  Height: 5' 3"  (1.6 m)   Body mass index is 19.49 kg/m.   General appearance:  Normal Thyroid:  Symmetrical, normal in size, without palpable masses or nodularity. Respiratory  Auscultation:  Clear without wheezing or rhonchi Cardiovascular  Auscultation:  Regular rate, without rubs, murmurs or gallops  Edema/varicosities:  Not grossly evident Abdominal  Soft,nontender, without masses, guarding or rebound.  Liver/spleen:  No organomegaly noted  Hernia:  None appreciated  Skin  Inspection:  Grossly normal   Breasts: Examined lying and sitting.     Right: Without masses, retractions, discharge or axillary adenopathy.     Left: Without masses, retractions, discharge or axillary adenopathy. Gentitourinary   Inguinal/mons:  Normal without inguinal adenopathy  External genitalia:  Normal  BUS/Urethra/Skene's glands:  Normal  Vagina: Atrophic  Cervix:  Normal  Uterus:   normal in size, shape and contour.  Midline and mobile  Adnexa/parametria:     Rt: Without masses or  tenderness.   Lt: Without masses or tenderness.  Anus and perineum: Normal  Digital rectal exam: Normal sphincter tone without palpated masses or tenderness  Assessment/Plan:  73 y.o. MWF G2, P2 for breast and pelvic exam with no GYN complaints.  Postmenopausal on no HRT with no bleeding Hypertension, hypothyroidism, anxiety/depression, hypercholesteremia-primary care manages labs and meds Osteoporosis completed 5 years of Fosamax   Plan: Repeat DEXA, reviewed importance of weightbearing and balance type exercise, yoga encouraged.  Home safety and fall prevention discussed.  SBEs, continue annual screening mammogram, calcium rich foods, vitamin D 2000 daily encouraged.  Keep scheduled colonoscopy appointment.  Encouraged self-care, leisure activities.    [place], 12:58 PM 06/04/2019

## 2019-06-05 LAB — URINALYSIS, COMPLETE W/RFL CULTURE
Bacteria, UA: NONE SEEN /HPF
Bilirubin Urine: NEGATIVE
Glucose, UA: NEGATIVE
Hgb urine dipstick: NEGATIVE
Hyaline Cast: NONE SEEN /LPF
Ketones, ur: NEGATIVE
Leukocyte Esterase: NEGATIVE
Nitrites, Initial: NEGATIVE
Protein, ur: NEGATIVE
RBC / HPF: NONE SEEN /HPF (ref 0–2)
Specific Gravity, Urine: 1.025 (ref 1.001–1.03)
Squamous Epithelial / HPF: NONE SEEN /HPF (ref <=5)
WBC, UA: NONE SEEN /HPF (ref 0–5)
pH: 6 (ref 5.0–8.0)

## 2019-06-05 LAB — NO CULTURE INDICATED

## 2019-06-06 ENCOUNTER — Other Ambulatory Visit: Payer: Self-pay

## 2019-06-06 ENCOUNTER — Encounter: Payer: Medicare Other | Admitting: Internal Medicine

## 2019-06-06 MED ORDER — ZOLPIDEM TARTRATE ER 6.25 MG PO TBCR: 6.2500 mg | EXTENDED_RELEASE_TABLET | Freq: Every evening | ORAL | 3 refills | Status: DC | PRN

## 2019-06-06 MED ORDER — LOSARTAN POTASSIUM 50 MG PO TABS: 25.0000 mg | ORAL_TABLET | Freq: Every day | ORAL | 1 refills | Status: DC

## 2019-06-06 MED ORDER — ATORVASTATIN CALCIUM 10 MG PO TABS: 10.0000 mg | ORAL_TABLET | Freq: Every day | ORAL | 3 refills | Status: DC

## 2019-06-06 MED ORDER — ESCITALOPRAM OXALATE 5 MG PO TABS: 5.0000 mg | ORAL_TABLET | Freq: Every day | ORAL | 3 refills | Status: DC

## 2019-06-06 MED ORDER — DEXILANT 60 MG PO CPDR: DELAYED_RELEASE_CAPSULE | ORAL | 3 refills | Status: DC

## 2019-06-06 MED ORDER — SYNTHROID 88 MCG PO TABS: 88.0000 ug | ORAL_TABLET | Freq: Every day | ORAL | 3 refills | Status: DC

## 2019-07-08 ENCOUNTER — Ambulatory Visit (AMBULATORY_SURGERY_CENTER): Payer: Self-pay | Admitting: *Deleted

## 2019-07-08 ENCOUNTER — Other Ambulatory Visit: Payer: Self-pay

## 2019-07-08 VITALS — Ht 63.0 in | Wt 110.0 lb

## 2019-07-08 DIAGNOSIS — Z1211 Encounter for screening for malignant neoplasm of colon: Secondary | ICD-10-CM

## 2019-07-08 MED ORDER — NA SULFATE-K SULFATE-MG SULF 17.5-3.13-1.6 GM/177ML PO SOLN: 1.0000 | Freq: Once | ORAL | 0 refills | Status: AC

## 2019-07-08 NOTE — Progress Notes (Signed)

## 2019-07-16 ENCOUNTER — Ambulatory Visit: Payer: Medicare Other

## 2019-07-17 ENCOUNTER — Encounter: Payer: Medicare Other | Admitting: Internal Medicine

## 2019-08-09 ENCOUNTER — Encounter: Payer: Self-pay | Admitting: Internal Medicine

## 2019-08-14 ENCOUNTER — Telehealth: Payer: Self-pay | Admitting: *Deleted

## 2019-08-14 NOTE — Telephone Encounter (Signed)
Attempted to reach pt to ask covid screening questions.  LM on VM.

## 2019-08-14 NOTE — Telephone Encounter (Signed)
Pt returned call and answered "NO" to all of the Covid-19 screening questions °

## 2019-08-15 ENCOUNTER — Ambulatory Visit (AMBULATORY_SURGERY_CENTER): Payer: Medicare Other | Admitting: Internal Medicine

## 2019-08-15 ENCOUNTER — Encounter: Payer: Self-pay | Admitting: Internal Medicine

## 2019-08-15 ENCOUNTER — Other Ambulatory Visit: Payer: Self-pay

## 2019-08-15 VITALS — BP 127/76 | HR 58 | Temp 99.1°F | Resp 13 | Ht 63.0 in | Wt 110.0 lb

## 2019-08-15 DIAGNOSIS — K6289 Other specified diseases of anus and rectum: Secondary | ICD-10-CM

## 2019-08-15 DIAGNOSIS — Z1211 Encounter for screening for malignant neoplasm of colon: Secondary | ICD-10-CM

## 2019-08-15 DIAGNOSIS — E039 Hypothyroidism, unspecified: Secondary | ICD-10-CM | POA: Diagnosis not present

## 2019-08-15 DIAGNOSIS — I10 Essential (primary) hypertension: Secondary | ICD-10-CM | POA: Diagnosis not present

## 2019-08-15 DIAGNOSIS — K219 Gastro-esophageal reflux disease without esophagitis: Secondary | ICD-10-CM | POA: Diagnosis not present

## 2019-08-15 MED ORDER — SODIUM CHLORIDE 0.9 % IV SOLN: 500.0000 mL | Freq: Once | INTRAVENOUS | Status: DC

## 2019-08-15 NOTE — Progress Notes (Signed)
Pt's states no medical or surgical changes since previsit or office visit.  June temp  Cecilia vitals

## 2019-08-15 NOTE — Patient Instructions (Signed)
   Await biopsy results  Resume usual diet and medications     YOU HAD AN ENDOSCOPIC PROCEDURE TODAY AT [facility]:   Refer to the procedure report that was given to you for any specific questions about what was found during the examination.  If the procedure report does not answer your questions, please call your gastroenterologist to clarify.  If you requested that your care partner not be given the details of your procedure findings, then the procedure report has been included in a sealed envelope for you to review at your convenience later.  YOU SHOULD EXPECT: Some feelings of bloating in the abdomen. Passage of more gas than usual.  Walking can help get rid of the air that was put into your GI tract during the procedure and reduce the bloating. If you had a lower endoscopy (such as a colonoscopy or flexible sigmoidoscopy) you may notice spotting of blood in your stool or on the toilet paper. If you underwent a bowel prep for your procedure, you may not have a normal bowel movement for a few days.  Please Note:  You might notice some irritation and congestion in your nose or some drainage.  This is from the oxygen used during your procedure.  There is no need for concern and it should clear up in a day or so.  SYMPTOMS TO REPORT IMMEDIATELY:   Following lower endoscopy (colonoscopy or flexible sigmoidoscopy):  Excessive amounts of blood in the stool  Significant tenderness or worsening of abdominal pains  Swelling of the abdomen that is new, acute  Fever of 100F or higher    For urgent or emergent issues, a gastroenterologist can be reached at any hour by calling [phone].   DIET:  We do recommend a small meal at first, but then you may proceed to your regular diet.  Drink plenty of fluids but you should avoid alcoholic beverages for 24 hours.  ACTIVITY:  You should plan to take it easy for the rest of today and you should NOT DRIVE or use heavy machinery  until tomorrow (because of the sedation medicines used during the test).    FOLLOW UP: Our staff will call the number listed on your records 48-72 hours following your procedure to check on you and address any questions or concerns that you may have regarding the information given to you following your procedure. If we do not reach you, we will leave a message.  We will attempt to reach you two times.  During this call, we will ask if you have developed any symptoms of COVID 19. If you develop any symptoms (ie: fever, flu-like symptoms, shortness of breath, cough etc.) before then, please call [phone].  If you test positive for Covid 19 in the 2 weeks post procedure, please call and report this information to [place].    If any biopsies were taken you will be contacted by phone or by letter within the next 1-3 weeks.  Please call us at [phone] if you have not heard about the biopsies in 3 weeks.    SIGNATURES/CONFIDENTIALITY: You and/or your care partner have signed paperwork which will be entered into your electronic medical record.  These signatures attest to the fact that that the information above on your After Visit Summary has been reviewed and is understood.  Full responsibility of the confidentiality of this discharge information lies with you and/or your care-partner.

## 2019-08-15 NOTE — Progress Notes (Signed)
A/ox3, pleased with MAC, report to RN 

## 2019-08-15 NOTE — Op Note (Signed)
[facility] Patient Name: Dawn Tucker Procedure Date: 08/15/2019 2:03 PM MRN: [id number] Endoscopist: Docia Chuck. Henrene Pastor , MD Age: 73 Referring MD:  Date of Birth: Apr 28, 1946 Gender: Female Account #: [id number] Procedure:                Colonoscopy with biopsies Indications:              Screening for colorectal malignant neoplasm.                            Previous examinations with Dr. Velora Heckler 1995, 2007 Medicines:                Monitored Anesthesia Care Procedure:                Pre-Anesthesia Assessment:                           - Prior to the procedure, a History and Physical                            was performed, and patient medications and                            allergies were reviewed. The patient's tolerance of                            previous anesthesia was also reviewed. The risks                            and benefits of the procedure and the sedation                            options and risks were discussed with the patient.                            All questions were answered, and informed consent                            was obtained. Prior Anticoagulants: The patient has                            taken no previous anticoagulant or antiplatelet                            agents. ASA Grade Assessment: II - A patient with                            mild systemic disease. After reviewing the risks                            and benefits, the patient was deemed in                            satisfactory condition to undergo the procedure.  After obtaining informed consent, the colonoscope                            was passed under direct vision. Throughout the                            procedure, the patient's blood pressure, pulse, and                            oxygen saturations were monitored continuously. The                            Colonoscope was introduced through the anus and   advanced to the the cecum, identified by                            appendiceal orifice and ileocecal valve. The                            ileocecal valve, appendiceal orifice, and rectum                            were photographed. The quality of the bowel                            preparation was excellent. The colonoscopy was                            performed without difficulty. The patient tolerated                            the procedure well. The bowel preparation used was                            SUPREP via split dose instruction. Scope In: 2:20:15 PM Scope Out: 2:37:08 PM Scope Withdrawal Time: 0 hours 9 minutes 32 seconds  Total Procedure Duration: 0 hours 16 minutes 53 seconds  Findings:                 The entire examined colon appeared normal except                            for a patchy area of erythematous mucosa in the                            most distal rectum. Question mild focal proctitis                            versus prolapse change versus prep change..                            Biopsies were taken with a cold forceps for  histology. No retroflexion performed due to narrow                            rectal vault, though the entire rectum was                            completely viewed from the anal os. Complications:            No immediate complications. Estimated blood loss:                            None. Estimated Blood Loss:     Estimated blood loss: none. Impression:               -Mild focal area of erythema in the rectum which                            was biopsied. Otherwise normal exam. Recommendation:           - Repeat colonoscopy is not recommended for the                            purposes of screening due to current age (31 years                            or older) and excellent findings on today's                            examination as well as prior history of                            unremarkable  examinations.                           - Patient has a contact number available for                            emergencies. The signs and symptoms of potential                            delayed complications were discussed with the                            patient. Return to normal activities tomorrow.                            Written discharge instructions were provided to the                            patient.                           - Resume previous diet.                           - Continue present medications.                           -  Await pathology results. Docia Chuck. Henrene Pastor, MD 08/15/2019 2:44:06 PM This report has been signed electronically.

## 2019-08-19 ENCOUNTER — Telehealth: Payer: Self-pay | Admitting: *Deleted

## 2019-08-19 NOTE — Telephone Encounter (Signed)
1. Have you developed a fever since your procedure? no  2.   Have you had an respiratory symptoms (SOB or cough) since your procedure? no  3.   Have you tested positive for COVID 19 since your procedure no  4.   Have you had any family members/close contacts diagnosed with the COVID 19 since your procedure?  no   If yes to any of these questions please route to Joylene John, RN and Alphonsa Gin, Therapist, sports.  Follow up Call-  Call back number 08/15/2019  Post procedure Call Back phone  # [phone]  Permission to leave phone message Yes  Some recent data might be hidden     Patient questions:  Do you have a fever, pain , or abdominal swelling? No. Pain Score  0 *  Have you tolerated food without any problems? Yes.    Have you been able to return to your normal activities? Yes.    Do you have any questions about your discharge instructions: Diet   No. Medications  No. Follow up visit  No.  Do you have questions or concerns about your Care? No.  Actions: * If pain score is 4 or above: No action needed, pain <4.

## 2019-08-20 ENCOUNTER — Encounter: Payer: Self-pay | Admitting: Internal Medicine

## 2019-08-22 ENCOUNTER — Other Ambulatory Visit: Payer: Self-pay | Admitting: *Deleted

## 2019-08-22 MED ORDER — INFLUENZA VAC SPLIT HIGH-DOSE 0.5 ML IM SUSY: 0.5000 mL | PREFILLED_SYRINGE | Freq: Once | INTRAMUSCULAR | 0 refills | Status: AC

## 2019-08-22 NOTE — Telephone Encounter (Signed)
,  Please send two prescriptions for flu shots for seniors to [facility], so Kim could pick them up and administer to Paul and Denzil at home.  Thanks,  AP    Done. See meds.  

## 2019-08-28 DIAGNOSIS — L821 Other seborrheic keratosis: Secondary | ICD-10-CM | POA: Diagnosis not present

## 2019-08-28 DIAGNOSIS — D2271 Melanocytic nevi of right lower limb, including hip: Secondary | ICD-10-CM | POA: Diagnosis not present

## 2019-08-28 DIAGNOSIS — D2272 Melanocytic nevi of left lower limb, including hip: Secondary | ICD-10-CM | POA: Diagnosis not present

## 2019-08-30 ENCOUNTER — Ambulatory Visit
Admission: RE | Admit: 2019-08-30 | Discharge: 2019-08-30 | Disposition: A | Discharge status: Home / Self Care | Payer: Medicare Other | Source: Ambulatory Visit | Attending: Gynecology | Admitting: Gynecology

## 2019-08-30 ENCOUNTER — Other Ambulatory Visit: Payer: Self-pay

## 2019-08-30 DIAGNOSIS — Z1231 Encounter for screening mammogram for malignant neoplasm of breast: Secondary | ICD-10-CM | POA: Diagnosis not present

## 2019-08-31 ENCOUNTER — Other Ambulatory Visit: Payer: Self-pay

## 2019-08-31 ENCOUNTER — Ambulatory Visit (INDEPENDENT_AMBULATORY_CARE_PROVIDER_SITE_OTHER): Payer: Medicare Other

## 2019-08-31 DIAGNOSIS — Z23 Encounter for immunization: Secondary | ICD-10-CM

## 2019-09-04 ENCOUNTER — Other Ambulatory Visit: Payer: Self-pay

## 2019-09-05 ENCOUNTER — Ambulatory Visit (INDEPENDENT_AMBULATORY_CARE_PROVIDER_SITE_OTHER): Payer: Medicare Other

## 2019-09-05 ENCOUNTER — Encounter: Payer: Self-pay | Admitting: Gynecology

## 2019-09-05 ENCOUNTER — Other Ambulatory Visit: Payer: Self-pay | Admitting: Women's Health

## 2019-09-05 DIAGNOSIS — M810 Age-related osteoporosis without current pathological fracture: Secondary | ICD-10-CM

## 2019-09-05 DIAGNOSIS — Z13820 Encounter for screening for osteoporosis: Secondary | ICD-10-CM

## 2019-09-05 DIAGNOSIS — Z780 Asymptomatic menopausal state: Secondary | ICD-10-CM

## 2019-09-05 DIAGNOSIS — E2839 Other primary ovarian failure: Secondary | ICD-10-CM

## 2019-09-25 ENCOUNTER — Encounter: Payer: Self-pay | Admitting: Gynecology

## 2019-10-07 DIAGNOSIS — H52203 Unspecified astigmatism, bilateral: Secondary | ICD-10-CM | POA: Diagnosis not present

## 2019-10-07 DIAGNOSIS — H0100B Unspecified blepharitis left eye, upper and lower eyelids: Secondary | ICD-10-CM | POA: Diagnosis not present

## 2019-10-07 DIAGNOSIS — H25813 Combined forms of age-related cataract, bilateral: Secondary | ICD-10-CM | POA: Diagnosis not present

## 2019-10-07 DIAGNOSIS — H353111 Nonexudative age-related macular degeneration, right eye, early dry stage: Secondary | ICD-10-CM | POA: Diagnosis not present

## 2019-11-11 DIAGNOSIS — S83242A Other tear of medial meniscus, current injury, left knee, initial encounter: Secondary | ICD-10-CM | POA: Diagnosis not present

## 2019-11-13 ENCOUNTER — Other Ambulatory Visit: Payer: Self-pay

## 2019-11-25 ENCOUNTER — Ambulatory Visit (INDEPENDENT_AMBULATORY_CARE_PROVIDER_SITE_OTHER): Payer: Medicare Other | Admitting: Internal Medicine

## 2019-11-25 ENCOUNTER — Other Ambulatory Visit: Payer: Self-pay

## 2019-11-25 ENCOUNTER — Encounter: Payer: Self-pay | Admitting: Internal Medicine

## 2019-11-25 VITALS — BP 118/62 | HR 69 | Temp 97.6°F | Ht 63.0 in | Wt 113.0 lb

## 2019-11-25 DIAGNOSIS — B029 Zoster without complications: Secondary | ICD-10-CM

## 2019-11-25 MED ORDER — VALACYCLOVIR HCL 1 G PO TABS: 1000.0000 mg | ORAL_TABLET | Freq: Two times a day (BID) | ORAL | 0 refills | Status: DC

## 2019-11-25 MED ORDER — LIDOCAINE 5 % EX PTCH: 1.0000 | MEDICATED_PATCH | CUTANEOUS | 0 refills | Status: DC

## 2019-11-25 NOTE — Assessment & Plan Note (Signed)
Rx valtrex and advised to take 1 TID for 1 week. Rx lidoderm patches for pain.

## 2019-11-25 NOTE — Progress Notes (Signed)
   Subjective:   Patient ID: Dawn Tucker, female    DOB: July 17, 1946, 73 y.o.   MRN: [id number]  HPI The patient is a 73 YO female coming in for potential shingles. Did have shingles back in 2018 and this feels similar. Rash on right upper back and wrapping around the side. Pain 6/10 located same location. Overall it is worsening. She did start taking valtrex with onset 2-3 days ago and this has helped. She thinks it is not as bad as 2018. She did get the new shingrix vaccine last year. Has tried valtrex.   Review of Systems  Constitutional: Negative.   HENT: Negative.   Eyes: Negative.   Respiratory: Negative for cough, chest tightness and shortness of breath.   Cardiovascular: Negative for chest pain, palpitations and leg swelling.  Gastrointestinal: Negative for abdominal distention, abdominal pain, constipation, diarrhea, nausea and vomiting.  Musculoskeletal: Negative.   Skin: Positive for rash.  Neurological: Negative.   Psychiatric/Behavioral: Negative.     Objective:  Physical Exam Constitutional:      Appearance: She is well-developed.  HENT:     Head: Normocephalic and atraumatic.  Neck:     Musculoskeletal: Normal range of motion.  Cardiovascular:     Rate and Rhythm: Normal rate and regular rhythm.  Pulmonary:     Effort: Pulmonary effort is normal. No respiratory distress.     Breath sounds: Normal breath sounds. No wheezing or rales.  Abdominal:     General: Bowel sounds are normal. There is no distension.     Palpations: Abdomen is soft.     Tenderness: There is no abdominal tenderness. There is no rebound.  Skin:    General: Skin is warm and dry.     Findings: Rash present.     Comments: Rash consistent with shingles right upper back and no rash on the flank or breast but tender to touch  Neurological:     Mental Status: She is alert and oriented to person, place, and time.     Coordination: Coordination normal.     Vitals:   11/25/19 1458  BP:  118/62  Pulse: 69  Temp: 97.6 F (36.4 C)  TempSrc: Oral  SpO2: 99%  Weight: 113 lb (51.3 kg)  Height: 5\' 3"  (1.6 m)    This visit occurred during the SARS-CoV-2 public health emergency.  Safety protocols were in place, including screening questions prior to the visit, additional usage of staff PPE, and extensive cleaning of exam room while observing appropriate contact time as indicated for disinfecting solutions.   Assessment & Plan:

## 2019-11-25 NOTE — Patient Instructions (Addendum)
We have sent in the valtrex. Take 1 pill 3 times a day for 1 week.   We have sent in lidocaine patches as well to use for pain and it is safe to use tylenol or ibuprofen as well.

## 2019-12-03 DIAGNOSIS — B029 Zoster without complications: Secondary | ICD-10-CM | POA: Diagnosis not present

## 2019-12-03 DIAGNOSIS — L821 Other seborrheic keratosis: Secondary | ICD-10-CM | POA: Diagnosis not present

## 2019-12-03 DIAGNOSIS — B009 Herpesviral infection, unspecified: Secondary | ICD-10-CM | POA: Diagnosis not present

## 2019-12-03 DIAGNOSIS — Z23 Encounter for immunization: Secondary | ICD-10-CM | POA: Diagnosis not present

## 2019-12-29 ENCOUNTER — Ambulatory Visit: Payer: Medicare Other | Attending: Internal Medicine

## 2019-12-29 DIAGNOSIS — Z23 Encounter for immunization: Secondary | ICD-10-CM | POA: Diagnosis not present

## 2019-12-29 NOTE — Progress Notes (Signed)
   Covid-19 Vaccination Clinic  Name:  Dawn Tucker    MRN: [id number] DOB: 08-26-1946  12/29/2019  Ms. Spells was observed post Covid-19 immunization for 15 minutes without incidence. She was provided with Vaccine Information Sheet and instruction to access the V-Safe system.   [name] was instructed to call 911 with any severe reactions post vaccine: Marland Kitchen Difficulty breathing  . Swelling of your face and throat  . A fast heartbeat  . A bad rash all over your body  . Dizziness and weakness    Immunizations Administered    Name Date Dose VIS Date Route   Pfizer COVID-19 Vaccine 12/29/2019  2:25 PM 0.3 mL 11/29/2019 Intramuscular   Manufacturer: [place]   Lot: [id number]   [place]: [id number]

## 2019-12-31 ENCOUNTER — Other Ambulatory Visit: Payer: Self-pay | Admitting: Internal Medicine

## 2020-01-18 ENCOUNTER — Ambulatory Visit: Payer: Medicare Other | Attending: Internal Medicine

## 2020-01-18 DIAGNOSIS — Z23 Encounter for immunization: Secondary | ICD-10-CM | POA: Insufficient documentation

## 2020-01-18 NOTE — Progress Notes (Signed)
   Covid-19 Vaccination Clinic  Name:  Dawn Tucker    MRN: [id number] DOB: 1946/07/13  01/18/2020  [name] was observed post Covid-19 immunization for 15 minutes without incidence. She was provided with Vaccine Information Sheet and instruction to access the V-Safe system.   [name] was instructed to call 911 with any severe reactions post vaccine: Marland Kitchen Difficulty breathing  . Swelling of your face and throat  . A fast heartbeat  . A bad rash all over your body  . Dizziness and weakness    Immunizations Administered    Name Date Dose VIS Date Route   Pfizer COVID-19 Vaccine 01/18/2020 11:20 AM 0.3 mL 11/29/2019 Intramuscular   Manufacturer: [place]   Lot: [id number]   [place]: [id number]

## 2020-01-19 ENCOUNTER — Ambulatory Visit: Payer: Medicare Other

## 2020-01-19 ENCOUNTER — Ambulatory Visit: Payer: Self-pay

## 2020-02-01 ENCOUNTER — Other Ambulatory Visit: Payer: Self-pay

## 2020-02-01 ENCOUNTER — Encounter (HOSPITAL_COMMUNITY): Payer: Self-pay

## 2020-02-01 ENCOUNTER — Ambulatory Visit (HOSPITAL_COMMUNITY)
Admission: EM | Admit: 2020-02-01 | Discharge: 2020-02-01 | Disposition: A | Discharge status: Home / Self Care | Payer: Medicare Other | Attending: Family Medicine | Admitting: Family Medicine

## 2020-02-01 DIAGNOSIS — Z20822 Contact with and (suspected) exposure to covid-19: Secondary | ICD-10-CM | POA: Diagnosis not present

## 2020-02-01 DIAGNOSIS — I10 Essential (primary) hypertension: Secondary | ICD-10-CM | POA: Diagnosis not present

## 2020-02-01 NOTE — ED Triage Notes (Signed)
Patient presents to Urgent Care with complaints of covid exposure since 5 days ago. Patient reports it has been 14 days today since her second covid vaccine, pt denies sx of illness.

## 2020-02-01 NOTE — ED Provider Notes (Addendum)
[place]   [id number] 02/01/20 Arrival Time: 1016  ASSESSMENT & PLAN:  1. Exposure to COVID-19 virus   2. Elevated blood pressure reading in office with diagnosis of hypertension     COVID-19 testing sent.   Follow-up Information    Plotnikov, Evie Lacks, MD.   Specialty: Internal Medicine Why: As needed. Contact information: [place] [address] [phone]         To recheck BP.  Reviewed expectations re: course of current medical issues. Questions answered. Outlined signs and symptoms indicating need for more acute intervention. Understanding verbalized. After Visit Summary given.   SUBJECTIVE: History from: patient. Dawn Tucker is a 74 y.o. female who requests COVID-19 testing. Known COVID-19 contact: reports exposure one week ago. Recent travel: none. Denies: runny nose, congestion, fever, cough, sore throat, difficulty breathing and headache. Normal PO intake without n/v/d. At time of exposure she was one week out from her second COVID vaccine injection.  Increased blood pressure noted today. Reports that she is treated for HTN. She reports taking medications as instructed, no chest pain on exertion, no dyspnea on exertion, no swelling of ankles and no palpitations.   OBJECTIVE:  Vitals:   02/01/20 1031  BP: (!) 177/66  Pulse: 73  Resp: 16  Temp: 98 F (36.7 C)  TempSrc: Oral  SpO2: 100%    General appearance: alert; no distress Eyes: PERRLA; EOMI; conjunctiva normal HENT: [place]; AT; nasal mucosa normal; oral mucosa normal Neck: supple  Lungs: speaks full sentences without difficulty; unlabored Extremities: no edema Skin: warm and dry Neurologic: normal gait Psychological: alert and cooperative; normal mood and affect  Labs: Labs Reviewed  NOVEL CORONAVIRUS, NAA (HOSP ORDER, SEND-OUT TO REF LAB; TAT 18-24 HRS)    Allergies  Allergen Reactions  . Sulfonamide Derivatives Other (See Comments)    REACTION  AS A CHILD    Past Medical History:  Diagnosis Date  . CHEST PAIN 03/23/2010  . DEGENERATIVE DISC DISEASE 08/07/2008  . Dysphagia   . DYSPHAGIA UNSPECIFIED 03/23/2010  . ESOPHAGEAL STRICTURE 01/10/2011  . FIBROIDS, UTERUS 08/07/2008  . GERD 03/23/2010  . Herpes   . HIATAL HERNIA 01/10/2011  . HYPOTHYROIDISM 08/07/2008  . IRRITABLE BOWEL SYNDROME, HX OF 08/07/2008  . Macular degeneration 2019   R mild dry  . Mixed hyperlipidemia 08/07/2008  . Osteoporosis 08/2019   T score -2.5 stable from prior DEXA  . PERSONAL HX COLONIC POLYPS 03/23/2010  . Scarlet fever 08/07/2008  . Unspecified pruritic disorder 08/07/2008  . VAGINITIS, ATROPHIC 08/07/2008   Social History   Socioeconomic History  . Marital status: Married    Spouse name: Not on file  . Number of children: 2  . Years of education: Not on file  . Highest education level: Not on file  Occupational History    Employer: UNEMPLOYED  Tobacco Use  . Smoking status: Never Smoker  . Smokeless tobacco: Never Used  Substance and Sexual Activity  . Alcohol use: Yes    Alcohol/week: 0.0 standard drinks    Comment: occassional  . Drug use: No  . Sexual activity: Not Currently    Partners: Male    Birth control/protection: Post-menopausal    Comment: INTERCOURSE AGE 80, LESS THAN 5 SEXUAL PARTNERS  Other Topics Concern  . Not on file  Social History Narrative   HSG, [employer]. Married 14'. 1 son- '79, 1 daughter- '71; 2 grandchildren. Marriage is in good health.Very active in [place]  Determinants of Health   Financial Resource Strain:   . Difficulty of Paying Living Expenses: Not on file  Food Insecurity:   . Worried About Charity fundraiser in the Last Year: Not on file  . Ran Out of Food in the Last Year: Not on file  Transportation Needs:   . Lack of Transportation (Medical): Not on file  . Lack of Transportation (Non-Medical): Not on file  Physical Activity:   . Days of Exercise per Week: Not on file    . Minutes of Exercise per Session: Not on file  Stress:   . Feeling of Stress : Not on file  Social Connections:   . Frequency of Communication with Friends and Family: Not on file  . Frequency of Social Gatherings with Friends and Family: Not on file  . Attends Religious Services: Not on file  . Active Member of Clubs or Organizations: Not on file  . Attends Archivist Meetings: Not on file  . Marital Status: Not on file  Intimate Partner Violence:   . Fear of Current or Ex-Partner: Not on file  . Emotionally Abused: Not on file  . Physically Abused: Not on file  . Sexually Abused: Not on file   Family History  Problem Relation Age of Onset  . Pneumonia Mother   . Heart disease Mother 60       CAD  . Coronary artery disease Father   . Heart disease Father 20       CAD/MI-fatal  . Breast cancer Paternal Aunt 82  . Ovarian cancer Paternal Aunt        dx in her 64s  . Prostate cancer Maternal Grandfather   . Breast cancer Sister 75       BRCA negative  . Hypertension Sister   . Diabetes Neg Hx   . Colon cancer Neg Hx   . Rectal cancer Neg Hx   . Stomach cancer Neg Hx   . Esophageal cancer Neg Hx    Past Surgical History:  Procedure Laterality Date  . BREAST EXCISIONAL BIOPSY Bilateral    benign  . BREAST SURGERY     Breast biopsies right & left- benign lesions  . CESAREAN SECTION    . COLONOSCOPY    . COLPOSCOPY    . GYNECOLOGIC CRYOSURGERY  by Dr. Dorathy Kinsman   low-grade dysplasia  . RHINOPLASTY    . TUBAL LIGATION       Vanessa Kick, MD 02/01/20 [place], MD 02/01/20 [phone]

## 2020-02-01 NOTE — Discharge Instructions (Addendum)
You have been tested for COVID-19 today. If your test returns positive, you will receive a phone call from [facility] regarding your results. Negative test results are not called. Both positive and negative results area always visible on MyChart. If you do not have a MyChart account, sign up instructions are provided in your discharge papers. Please do not hesitate to contact us should you have questions or concerns.  Your blood pressure was noted to be elevated during your visit today. You may return here within the next few days to recheck if unable to see your primary care doctor.  BP (!) 177/66 (BP Location: Left Arm)   Pulse 73   Temp 98 F (36.7 C) (Oral)   Resp 16   SpO2 100%

## 2020-02-03 LAB — NOVEL CORONAVIRUS, NAA (HOSP ORDER, SEND-OUT TO REF LAB; TAT 18-24 HRS): SARS-CoV-2, NAA: NOT DETECTED

## 2020-04-21 ENCOUNTER — Other Ambulatory Visit: Payer: Self-pay | Admitting: Internal Medicine

## 2020-04-21 DIAGNOSIS — M25571 Pain in right ankle and joints of right foot: Secondary | ICD-10-CM | POA: Diagnosis not present

## 2020-04-21 DIAGNOSIS — M7662 Achilles tendinitis, left leg: Secondary | ICD-10-CM | POA: Diagnosis not present

## 2020-04-28 DIAGNOSIS — M25572 Pain in left ankle and joints of left foot: Secondary | ICD-10-CM | POA: Diagnosis not present

## 2020-04-28 DIAGNOSIS — S86011D Strain of right Achilles tendon, subsequent encounter: Secondary | ICD-10-CM | POA: Diagnosis not present

## 2020-04-28 DIAGNOSIS — S86012D Strain of left Achilles tendon, subsequent encounter: Secondary | ICD-10-CM | POA: Diagnosis not present

## 2020-04-30 DIAGNOSIS — M25572 Pain in left ankle and joints of left foot: Secondary | ICD-10-CM | POA: Diagnosis not present

## 2020-04-30 DIAGNOSIS — S86011D Strain of right Achilles tendon, subsequent encounter: Secondary | ICD-10-CM | POA: Diagnosis not present

## 2020-04-30 DIAGNOSIS — S86012D Strain of left Achilles tendon, subsequent encounter: Secondary | ICD-10-CM | POA: Diagnosis not present

## 2020-05-07 DIAGNOSIS — S86011D Strain of right Achilles tendon, subsequent encounter: Secondary | ICD-10-CM | POA: Diagnosis not present

## 2020-05-07 DIAGNOSIS — H25813 Combined forms of age-related cataract, bilateral: Secondary | ICD-10-CM | POA: Diagnosis not present

## 2020-05-07 DIAGNOSIS — H52203 Unspecified astigmatism, bilateral: Secondary | ICD-10-CM | POA: Diagnosis not present

## 2020-05-07 DIAGNOSIS — M25572 Pain in left ankle and joints of left foot: Secondary | ICD-10-CM | POA: Diagnosis not present

## 2020-05-07 DIAGNOSIS — H353111 Nonexudative age-related macular degeneration, right eye, early dry stage: Secondary | ICD-10-CM | POA: Diagnosis not present

## 2020-05-07 DIAGNOSIS — S86012D Strain of left Achilles tendon, subsequent encounter: Secondary | ICD-10-CM | POA: Diagnosis not present

## 2020-05-07 DIAGNOSIS — H04123 Dry eye syndrome of bilateral lacrimal glands: Secondary | ICD-10-CM | POA: Diagnosis not present

## 2020-05-12 ENCOUNTER — Telehealth: Payer: Self-pay

## 2020-05-12 DIAGNOSIS — E559 Vitamin D deficiency, unspecified: Secondary | ICD-10-CM

## 2020-05-12 DIAGNOSIS — E039 Hypothyroidism, unspecified: Secondary | ICD-10-CM

## 2020-05-12 DIAGNOSIS — R202 Paresthesia of skin: Secondary | ICD-10-CM

## 2020-05-12 DIAGNOSIS — S86012D Strain of left Achilles tendon, subsequent encounter: Secondary | ICD-10-CM | POA: Diagnosis not present

## 2020-05-12 DIAGNOSIS — S86011D Strain of right Achilles tendon, subsequent encounter: Secondary | ICD-10-CM | POA: Diagnosis not present

## 2020-05-12 DIAGNOSIS — E782 Mixed hyperlipidemia: Secondary | ICD-10-CM

## 2020-05-12 DIAGNOSIS — M25572 Pain in left ankle and joints of left foot: Secondary | ICD-10-CM | POA: Diagnosis not present

## 2020-05-12 NOTE — Telephone Encounter (Signed)
New message    The patient is asking for a lab order to be entered before appt in June.

## 2020-05-12 NOTE — Telephone Encounter (Signed)
Ok Thx 

## 2020-05-13 NOTE — Telephone Encounter (Signed)
Pt informed of below.  

## 2020-05-19 ENCOUNTER — Ambulatory Visit (INDEPENDENT_AMBULATORY_CARE_PROVIDER_SITE_OTHER): Payer: Medicare Other | Admitting: Internal Medicine

## 2020-05-19 ENCOUNTER — Encounter: Payer: Self-pay | Admitting: Internal Medicine

## 2020-05-19 ENCOUNTER — Other Ambulatory Visit (INDEPENDENT_AMBULATORY_CARE_PROVIDER_SITE_OTHER): Payer: Medicare Other

## 2020-05-19 ENCOUNTER — Other Ambulatory Visit: Payer: Self-pay

## 2020-05-19 DIAGNOSIS — E559 Vitamin D deficiency, unspecified: Secondary | ICD-10-CM | POA: Diagnosis not present

## 2020-05-19 DIAGNOSIS — R202 Paresthesia of skin: Secondary | ICD-10-CM | POA: Diagnosis not present

## 2020-05-19 DIAGNOSIS — R7989 Other specified abnormal findings of blood chemistry: Secondary | ICD-10-CM | POA: Diagnosis not present

## 2020-05-19 DIAGNOSIS — S86012D Strain of left Achilles tendon, subsequent encounter: Secondary | ICD-10-CM | POA: Diagnosis not present

## 2020-05-19 DIAGNOSIS — S86011D Strain of right Achilles tendon, subsequent encounter: Secondary | ICD-10-CM | POA: Diagnosis not present

## 2020-05-19 DIAGNOSIS — I10 Essential (primary) hypertension: Secondary | ICD-10-CM | POA: Diagnosis not present

## 2020-05-19 DIAGNOSIS — E039 Hypothyroidism, unspecified: Secondary | ICD-10-CM

## 2020-05-19 DIAGNOSIS — F439 Reaction to severe stress, unspecified: Secondary | ICD-10-CM | POA: Diagnosis not present

## 2020-05-19 DIAGNOSIS — E782 Mixed hyperlipidemia: Secondary | ICD-10-CM | POA: Diagnosis not present

## 2020-05-19 DIAGNOSIS — F5101 Primary insomnia: Secondary | ICD-10-CM

## 2020-05-19 DIAGNOSIS — M25572 Pain in left ankle and joints of left foot: Secondary | ICD-10-CM | POA: Diagnosis not present

## 2020-05-19 LAB — BASIC METABOLIC PANEL
BUN: 25 mg/dL — ABNORMAL HIGH (ref 6–23)
CO2: 29 mEq/L (ref 19–32)
Calcium: 9.5 mg/dL (ref 8.4–10.5)
Chloride: 99 mEq/L (ref 96–112)
Creatinine, Ser: 1.13 mg/dL (ref 0.40–1.20)
GFR: 47.12 mL/min — ABNORMAL LOW (ref >60.00)
Glucose, Bld: 100 mg/dL — ABNORMAL HIGH (ref 70–99)
Potassium: 4.9 mEq/L (ref 3.5–5.1)
Sodium: 135 mEq/L (ref 135–145)

## 2020-05-19 LAB — URINALYSIS
Bilirubin Urine: NEGATIVE
Hgb urine dipstick: NEGATIVE
Ketones, ur: NEGATIVE
Leukocytes,Ua: NEGATIVE
Nitrite: NEGATIVE
Specific Gravity, Urine: 1.025 (ref 1.000–1.030)
Total Protein, Urine: NEGATIVE
Urine Glucose: NEGATIVE
Urobilinogen, UA: 0.2 (ref 0.0–1.0)
pH: 5.5 (ref 5.0–8.0)

## 2020-05-19 LAB — CBC WITH DIFFERENTIAL/PLATELET
Basophils Absolute: 0 10*3/uL (ref 0.0–0.1)
Basophils Relative: 0.7 % (ref 0.0–3.0)
Eosinophils Absolute: 0.2 10*3/uL (ref 0.0–0.7)
Eosinophils Relative: 3.6 % (ref 0.0–5.0)
HCT: 37.7 % (ref 36.0–46.0)
Hemoglobin: 12.7 g/dL (ref 12.0–15.0)
Lymphocytes Relative: 29 % (ref 12.0–46.0)
Lymphs Abs: 1.9 10*3/uL (ref 0.7–4.0)
MCHC: 33.8 g/dL (ref 30.0–36.0)
MCV: 91.7 fl (ref 78.0–100.0)
Monocytes Absolute: 0.5 10*3/uL (ref 0.1–1.0)
Monocytes Relative: 7.7 % (ref 3.0–12.0)
Neutro Abs: 3.8 10*3/uL (ref 1.4–7.7)
Neutrophils Relative %: 59 % (ref 43.0–77.0)
Platelets: 231 10*3/uL (ref 150.0–400.0)
RBC: 4.1 Mil/uL (ref 3.87–5.11)
RDW: 13.2 % (ref 11.5–15.5)
WBC: 6.4 10*3/uL (ref 4.0–10.5)

## 2020-05-19 LAB — VITAMIN D 25 HYDROXY (VIT D DEFICIENCY, FRACTURES): VITD: 39.47 ng/mL (ref 30.00–100.00)

## 2020-05-19 LAB — VITAMIN B12: Vitamin B-12: 639 pg/mL (ref 211–911)

## 2020-05-19 LAB — HEPATIC FUNCTION PANEL
ALT: 31 U/L (ref 0–35)
AST: 26 U/L (ref 0–37)
Albumin: 4.4 g/dL (ref 3.5–5.2)
Alkaline Phosphatase: 75 U/L (ref 39–117)
Bilirubin, Direct: 0.2 mg/dL (ref 0.0–0.3)
Total Bilirubin: 0.7 mg/dL (ref 0.2–1.2)
Total Protein: 7.3 g/dL (ref 6.0–8.3)

## 2020-05-19 LAB — LIPID PANEL
Cholesterol: 194 mg/dL (ref 0–200)
HDL: 57.2 mg/dL (ref >39.00)
LDL Cholesterol: 114 mg/dL — ABNORMAL HIGH (ref 0–99)
NonHDL: 136.92
Total CHOL/HDL Ratio: 3
Triglycerides: 114 mg/dL (ref 0.0–149.0)
VLDL: 22.8 mg/dL (ref 0.0–40.0)

## 2020-05-19 LAB — TSH: TSH: 0.87 u[IU]/mL (ref 0.35–4.50)

## 2020-05-19 LAB — T4, FREE: Free T4: 0.91 ng/dL (ref 0.60–1.60)

## 2020-05-19 MED ORDER — SYNTHROID 88 MCG PO TABS: 88.0000 ug | ORAL_TABLET | Freq: Every day | ORAL | 3 refills | Status: DC

## 2020-05-19 MED ORDER — ATORVASTATIN CALCIUM 10 MG PO TABS: 10.0000 mg | ORAL_TABLET | Freq: Every day | ORAL | 3 refills | Status: DC

## 2020-05-19 MED ORDER — ZOLPIDEM TARTRATE ER 6.25 MG PO TBCR: EXTENDED_RELEASE_TABLET | ORAL | 5 refills | Status: DC

## 2020-05-19 MED ORDER — ALPRAZOLAM 0.25 MG PO TABS: 0.2500 mg | ORAL_TABLET | Freq: Two times a day (BID) | ORAL | 1 refills | Status: AC | PRN

## 2020-05-19 MED ORDER — DEXILANT 60 MG PO CPDR: DELAYED_RELEASE_CAPSULE | ORAL | 3 refills | Status: DC

## 2020-05-19 MED ORDER — VALACYCLOVIR HCL 500 MG PO TABS: 500.0000 mg | ORAL_TABLET | Freq: Every day | ORAL | 3 refills | Status: DC

## 2020-05-19 NOTE — Assessment & Plan Note (Signed)
On Losartan 

## 2020-05-19 NOTE — Assessment & Plan Note (Signed)
Dawn Tucker is ill Xanax prn - rare - if worse

## 2020-05-19 NOTE — Assessment & Plan Note (Signed)
Zolpidem  Potential benefits of a long term benzodiazepines  use as well as potential risks  and complications were explained to the patient and were aknowledged.  

## 2020-05-19 NOTE — Progress Notes (Signed)
Subjective:  Patient ID: Dawn Tucker, female    DOB: 03/06/1946  Age: 74 y.o. MRN: [id number]  CC: Follow-up (Medication renewals)   HPI  AZURE BOGARIN presents for a 6 months f/u - insomnia, stress w/Paul's illness, GERD, hypothyroidism  Outpatient Medications Prior to Visit  Medication Sig Dispense Refill  . aspirin EC 81 MG tablet Take 81 mg by mouth daily.    . Calcium-Vitamin D-Vitamin K 500-200-40 MG-UNT-MCG CHEW Chew by mouth daily. Reported on 01/26/2016    . diclofenac sodium (VOLTAREN) 1 % GEL Apply 1 application topically 4 (four) times daily. 100 g 0  . escitalopram (LEXAPRO) 5 MG tablet TAKE 1 TABLET(5 MG) BY MOUTH DAILY 90 tablet 3  . lidocaine (LIDODERM) 5 % Place 1 patch onto the skin daily. Remove & Discard patch within 12 hours or as directed by MD 30 patch 0  . losartan (COZAAR) 50 MG tablet Take 0.5 tablets (25 mg total) by mouth daily. 90 tablet 1  . meloxicam (MOBIC) 15 MG tablet Take 15 mg by mouth daily as needed. Take 1 tablet by mouth daily    . Multiple Vitamin (MULTIVITAMIN) tablet Take 1 tablet by mouth daily.      Marland Kitchen nystatin-triamcinolone ointment (MYCOLOG) Apply topically 2 (two) times daily as needed.    . ondansetron (ZOFRAN) 4 MG tablet Take 1 tablet (4 mg total) by mouth every 8 (eight) hours as needed for nausea or vomiting. 20 tablet 0  . valACYclovir (VALTREX) 1000 MG tablet Take 1 tablet (1,000 mg total) by mouth 2 (two) times daily. 60 tablet 0  . zolpidem (AMBIEN CR) 6.25 MG CR tablet TAKE 1 TABLET(6.25 MG) BY MOUTH AT BEDTIME AS NEEDED FOR SLEEP 30 tablet 5  . atorvastatin (LIPITOR) 10 MG tablet Take 1 tablet (10 mg total) by mouth daily. 90 tablet 3  . dexlansoprazole (DEXILANT) 60 MG capsule TAKE 1 CAPSULE BY MOUTH DAILY 90 capsule 3  . SYNTHROID 88 MCG tablet Take 1 tablet (88 mcg total) by mouth daily before breakfast. 90 tablet 3   No facility-administered medications prior to visit.    ROS: Review of Systems  Constitutional:  Negative for activity change, appetite change, chills, diaphoresis, fatigue, fever and unexpected weight change.  HENT: Negative for congestion, dental problem, ear pain, hearing loss, mouth sores, postnasal drip, sinus pressure, sneezing, sore throat and voice change.   Eyes: Negative for pain and visual disturbance.  Respiratory: Negative for cough, chest tightness, wheezing and stridor.   Cardiovascular: Negative for chest pain, palpitations and leg swelling.  Gastrointestinal: Negative for abdominal distention, abdominal pain, blood in stool, nausea, rectal pain and vomiting.  Genitourinary: Negative for decreased urine volume, difficulty urinating, dysuria, frequency, hematuria, menstrual problem, vaginal bleeding, vaginal discharge and vaginal pain.  Musculoskeletal: Positive for arthralgias. Negative for back pain, gait problem, joint swelling and neck pain.  Skin: Negative for color change, rash and wound.  Neurological: Negative for dizziness, tremors, syncope, speech difficulty, weakness and light-headedness.  Hematological: Negative for adenopathy.  Psychiatric/Behavioral: Positive for sleep disturbance. Negative for behavioral problems, confusion, decreased concentration, dysphoric mood, hallucinations and suicidal ideas. The patient is nervous/anxious. The patient is not hyperactive.     Objective:  BP 110/70 (BP Location: Left Arm)   Pulse 65   Temp 98.5 F (36.9 C) (Oral)   Wt 117 lb (53.1 kg)   SpO2 96%   BMI 20.73 kg/m   BP Readings from Last 3 Encounters:  05/19/20 110/70  02/01/20 Marland Kitchen)  177/66  11/25/19 118/62    Wt Readings from Last 3 Encounters:  05/19/20 117 lb (53.1 kg)  11/25/19 113 lb (51.3 kg)  08/15/19 110 lb (49.9 kg)    Physical Exam Constitutional:      General: She is not in acute distress.    Appearance: She is well-developed.  HENT:     Head: Normocephalic.     Right Ear: External ear normal.     Left Ear: External ear normal.     Nose: Nose  normal.  Eyes:     General:        Right eye: No discharge.        Left eye: No discharge.     Conjunctiva/sclera: Conjunctivae normal.     Pupils: Pupils are equal, round, and reactive to light.  Neck:     Thyroid: No thyromegaly.     Vascular: No JVD.     Trachea: No tracheal deviation.  Cardiovascular:     Rate and Rhythm: Normal rate and regular rhythm.     Heart sounds: Normal heart sounds.  Pulmonary:     Effort: No respiratory distress.     Breath sounds: No stridor. No wheezing.  Abdominal:     General: Bowel sounds are normal. There is no distension.     Palpations: Abdomen is soft. There is no mass.     Tenderness: There is no abdominal tenderness. There is no guarding or rebound.  Musculoskeletal:        General: No tenderness.     Cervical back: Normal range of motion and neck supple.  Lymphadenopathy:     Cervical: No cervical adenopathy.  Skin:    Findings: No erythema or rash.  Neurological:     Mental Status: She is oriented to person, place, and time.     Cranial Nerves: No cranial nerve deficit.     Motor: No abnormal muscle tone.     Coordination: Coordination normal.     Deep Tendon Reflexes: Reflexes normal.  Psychiatric:        Behavior: Behavior normal.        Thought Content: Thought content normal.        Judgment: Judgment normal.     Lab Results  Component Value Date   WBC 5.6 06/04/2018   HGB 12.6 06/04/2018   HCT 37.4 06/04/2018   PLT 237.0 06/04/2018   GLUCOSE 97 11/05/2018   CHOL 187 06/04/2018   TRIG 105.0 06/04/2018   HDL 72.20 06/04/2018   LDLCALC 94 06/04/2018   ALT 33 06/04/2018   AST 27 06/04/2018   NA 140 11/05/2018   K 4.0 11/05/2018   CL 103 11/05/2018   CREATININE 1.02 11/05/2018   BUN 22 11/05/2018   CO2 29 11/05/2018   TSH 0.03 (L) 11/05/2018   HGBA1C 5.9 10/02/2015    No results found.  Assessment & Plan:   There are no diagnoses linked to this encounter.   Meds ordered this encounter  Medications  .  atorvastatin (LIPITOR) 10 MG tablet    Sig: Take 1 tablet (10 mg total) by mouth daily.    Dispense:  90 tablet    Refill:  3  . dexlansoprazole (DEXILANT) 60 MG capsule    Sig: TAKE 1 CAPSULE BY MOUTH DAILY    Dispense:  90 capsule    Refill:  3  . SYNTHROID 88 MCG tablet    Sig: Take 1 tablet (88 mcg total) by mouth daily before breakfast.  Dispense:  90 tablet    Refill:  3     Follow-up: No follow-ups on file.  Walker Kehr, MD

## 2020-05-19 NOTE — Assessment & Plan Note (Signed)
LFTs 

## 2020-05-19 NOTE — Assessment & Plan Note (Signed)
Levothroid 88 mcg/d

## 2020-05-20 ENCOUNTER — Other Ambulatory Visit: Payer: Self-pay | Admitting: Internal Medicine

## 2020-05-20 DIAGNOSIS — N2889 Other specified disorders of kidney and ureter: Secondary | ICD-10-CM | POA: Insufficient documentation

## 2020-05-20 DIAGNOSIS — N1831 Chronic kidney disease, stage 3a: Secondary | ICD-10-CM | POA: Insufficient documentation

## 2020-05-22 DIAGNOSIS — M25572 Pain in left ankle and joints of left foot: Secondary | ICD-10-CM | POA: Diagnosis not present

## 2020-05-22 DIAGNOSIS — S86011D Strain of right Achilles tendon, subsequent encounter: Secondary | ICD-10-CM | POA: Diagnosis not present

## 2020-05-22 DIAGNOSIS — S86012D Strain of left Achilles tendon, subsequent encounter: Secondary | ICD-10-CM | POA: Diagnosis not present

## 2020-05-26 ENCOUNTER — Ambulatory Visit
Admission: RE | Admit: 2020-05-26 | Discharge: 2020-05-26 | Disposition: A | Discharge status: Home / Self Care | Payer: Medicare Other | Source: Ambulatory Visit | Attending: Internal Medicine | Admitting: Internal Medicine

## 2020-05-26 DIAGNOSIS — N281 Cyst of kidney, acquired: Secondary | ICD-10-CM | POA: Diagnosis not present

## 2020-05-26 DIAGNOSIS — N1831 Chronic kidney disease, stage 3a: Secondary | ICD-10-CM

## 2020-06-17 DIAGNOSIS — S86012D Strain of left Achilles tendon, subsequent encounter: Secondary | ICD-10-CM | POA: Diagnosis not present

## 2020-06-17 DIAGNOSIS — M25572 Pain in left ankle and joints of left foot: Secondary | ICD-10-CM | POA: Diagnosis not present

## 2020-06-17 DIAGNOSIS — S86011D Strain of right Achilles tendon, subsequent encounter: Secondary | ICD-10-CM | POA: Diagnosis not present

## 2020-06-25 DIAGNOSIS — S86011D Strain of right Achilles tendon, subsequent encounter: Secondary | ICD-10-CM | POA: Diagnosis not present

## 2020-06-25 DIAGNOSIS — S86012D Strain of left Achilles tendon, subsequent encounter: Secondary | ICD-10-CM | POA: Diagnosis not present

## 2020-06-25 DIAGNOSIS — M25572 Pain in left ankle and joints of left foot: Secondary | ICD-10-CM | POA: Diagnosis not present

## 2020-06-30 DIAGNOSIS — M25572 Pain in left ankle and joints of left foot: Secondary | ICD-10-CM | POA: Diagnosis not present

## 2020-06-30 DIAGNOSIS — S86012D Strain of left Achilles tendon, subsequent encounter: Secondary | ICD-10-CM | POA: Diagnosis not present

## 2020-06-30 DIAGNOSIS — S86011D Strain of right Achilles tendon, subsequent encounter: Secondary | ICD-10-CM | POA: Diagnosis not present

## 2020-07-02 DIAGNOSIS — S86012D Strain of left Achilles tendon, subsequent encounter: Secondary | ICD-10-CM | POA: Diagnosis not present

## 2020-07-02 DIAGNOSIS — M25572 Pain in left ankle and joints of left foot: Secondary | ICD-10-CM | POA: Diagnosis not present

## 2020-07-02 DIAGNOSIS — S86011D Strain of right Achilles tendon, subsequent encounter: Secondary | ICD-10-CM | POA: Diagnosis not present

## 2020-07-04 ENCOUNTER — Other Ambulatory Visit: Payer: Self-pay | Admitting: Internal Medicine

## 2020-07-07 DIAGNOSIS — M25572 Pain in left ankle and joints of left foot: Secondary | ICD-10-CM | POA: Diagnosis not present

## 2020-07-07 DIAGNOSIS — S86011D Strain of right Achilles tendon, subsequent encounter: Secondary | ICD-10-CM | POA: Diagnosis not present

## 2020-07-07 DIAGNOSIS — S86012D Strain of left Achilles tendon, subsequent encounter: Secondary | ICD-10-CM | POA: Diagnosis not present

## 2020-07-16 ENCOUNTER — Telehealth: Payer: Self-pay

## 2020-07-16 NOTE — Telephone Encounter (Signed)
1.Medication Requested:  losartan (COZAAR) 50 MG tablet  atorvastatin (LIPITOR) 10 MG tablet  escitalopram (LEXAPRO) 5 MG tablet  valACYclovir (VALTREX) 500 MG tablet  dexlansoprazole (DEXILANT) 60 MG capsule  2. Pharmacy (Name, [place]): [facility] in [place] on [address] phone # [phone] contact Shawn   3. On Med List: Yes   4. Last Visit with PCP:   5. Next visit date with PCP: 12.21.21    Still in [place] asking for couple of pill until she comes home   Agent: Please be advised that RX refills may take up to 3 business days. We ask that you follow-up with your pharmacy.

## 2020-07-17 MED ORDER — ATORVASTATIN CALCIUM 10 MG PO TABS: 10.0000 mg | ORAL_TABLET | Freq: Every day | ORAL | 0 refills | Status: DC

## 2020-07-17 MED ORDER — DEXILANT 60 MG PO CPDR: DELAYED_RELEASE_CAPSULE | ORAL | 0 refills | Status: DC

## 2020-07-17 MED ORDER — ESCITALOPRAM OXALATE 5 MG PO TABS: ORAL_TABLET | ORAL | 0 refills | Status: DC

## 2020-07-17 NOTE — Telephone Encounter (Signed)
See med refill 07/17/2020

## 2020-07-21 DIAGNOSIS — S86011D Strain of right Achilles tendon, subsequent encounter: Secondary | ICD-10-CM | POA: Diagnosis not present

## 2020-07-21 DIAGNOSIS — S86012D Strain of left Achilles tendon, subsequent encounter: Secondary | ICD-10-CM | POA: Diagnosis not present

## 2020-07-21 DIAGNOSIS — M25572 Pain in left ankle and joints of left foot: Secondary | ICD-10-CM | POA: Diagnosis not present

## 2020-07-23 DIAGNOSIS — S86012D Strain of left Achilles tendon, subsequent encounter: Secondary | ICD-10-CM | POA: Diagnosis not present

## 2020-07-23 DIAGNOSIS — M25572 Pain in left ankle and joints of left foot: Secondary | ICD-10-CM | POA: Diagnosis not present

## 2020-07-23 DIAGNOSIS — S86011D Strain of right Achilles tendon, subsequent encounter: Secondary | ICD-10-CM | POA: Diagnosis not present

## 2020-07-28 DIAGNOSIS — S86012D Strain of left Achilles tendon, subsequent encounter: Secondary | ICD-10-CM | POA: Diagnosis not present

## 2020-07-28 DIAGNOSIS — S86011D Strain of right Achilles tendon, subsequent encounter: Secondary | ICD-10-CM | POA: Diagnosis not present

## 2020-07-28 DIAGNOSIS — M25572 Pain in left ankle and joints of left foot: Secondary | ICD-10-CM | POA: Diagnosis not present

## 2020-07-30 DIAGNOSIS — S86012D Strain of left Achilles tendon, subsequent encounter: Secondary | ICD-10-CM | POA: Diagnosis not present

## 2020-07-30 DIAGNOSIS — M25572 Pain in left ankle and joints of left foot: Secondary | ICD-10-CM | POA: Diagnosis not present

## 2020-07-30 DIAGNOSIS — S86011D Strain of right Achilles tendon, subsequent encounter: Secondary | ICD-10-CM | POA: Diagnosis not present

## 2020-08-10 DIAGNOSIS — S86011D Strain of right Achilles tendon, subsequent encounter: Secondary | ICD-10-CM | POA: Diagnosis not present

## 2020-08-10 DIAGNOSIS — M25572 Pain in left ankle and joints of left foot: Secondary | ICD-10-CM | POA: Diagnosis not present

## 2020-08-10 DIAGNOSIS — S86012D Strain of left Achilles tendon, subsequent encounter: Secondary | ICD-10-CM | POA: Diagnosis not present

## 2020-08-10 DIAGNOSIS — Z23 Encounter for immunization: Secondary | ICD-10-CM | POA: Diagnosis not present

## 2020-08-12 DIAGNOSIS — S86011D Strain of right Achilles tendon, subsequent encounter: Secondary | ICD-10-CM | POA: Diagnosis not present

## 2020-08-12 DIAGNOSIS — M25572 Pain in left ankle and joints of left foot: Secondary | ICD-10-CM | POA: Diagnosis not present

## 2020-08-12 DIAGNOSIS — S83012D Lateral subluxation of left patella, subsequent encounter: Secondary | ICD-10-CM | POA: Diagnosis not present

## 2020-08-19 DIAGNOSIS — S86012D Strain of left Achilles tendon, subsequent encounter: Secondary | ICD-10-CM | POA: Diagnosis not present

## 2020-08-19 DIAGNOSIS — S86011D Strain of right Achilles tendon, subsequent encounter: Secondary | ICD-10-CM | POA: Diagnosis not present

## 2020-08-19 DIAGNOSIS — M25572 Pain in left ankle and joints of left foot: Secondary | ICD-10-CM | POA: Diagnosis not present

## 2020-08-21 DIAGNOSIS — M25572 Pain in left ankle and joints of left foot: Secondary | ICD-10-CM | POA: Diagnosis not present

## 2020-08-21 DIAGNOSIS — S86012D Strain of left Achilles tendon, subsequent encounter: Secondary | ICD-10-CM | POA: Diagnosis not present

## 2020-08-21 DIAGNOSIS — S86011D Strain of right Achilles tendon, subsequent encounter: Secondary | ICD-10-CM | POA: Diagnosis not present

## 2020-08-26 DIAGNOSIS — S86011D Strain of right Achilles tendon, subsequent encounter: Secondary | ICD-10-CM | POA: Diagnosis not present

## 2020-08-26 DIAGNOSIS — M25572 Pain in left ankle and joints of left foot: Secondary | ICD-10-CM | POA: Diagnosis not present

## 2020-08-26 DIAGNOSIS — S86012D Strain of left Achilles tendon, subsequent encounter: Secondary | ICD-10-CM | POA: Diagnosis not present

## 2020-08-28 DIAGNOSIS — S86011D Strain of right Achilles tendon, subsequent encounter: Secondary | ICD-10-CM | POA: Diagnosis not present

## 2020-08-28 DIAGNOSIS — M25572 Pain in left ankle and joints of left foot: Secondary | ICD-10-CM | POA: Diagnosis not present

## 2020-08-28 DIAGNOSIS — S86012D Strain of left Achilles tendon, subsequent encounter: Secondary | ICD-10-CM | POA: Diagnosis not present

## 2020-08-31 DIAGNOSIS — S83012D Lateral subluxation of left patella, subsequent encounter: Secondary | ICD-10-CM | POA: Diagnosis not present

## 2020-08-31 DIAGNOSIS — M25572 Pain in left ankle and joints of left foot: Secondary | ICD-10-CM | POA: Diagnosis not present

## 2020-08-31 DIAGNOSIS — S86011D Strain of right Achilles tendon, subsequent encounter: Secondary | ICD-10-CM | POA: Diagnosis not present

## 2020-09-07 DIAGNOSIS — S86012D Strain of left Achilles tendon, subsequent encounter: Secondary | ICD-10-CM | POA: Diagnosis not present

## 2020-09-07 DIAGNOSIS — M25572 Pain in left ankle and joints of left foot: Secondary | ICD-10-CM | POA: Diagnosis not present

## 2020-09-07 DIAGNOSIS — S86011D Strain of right Achilles tendon, subsequent encounter: Secondary | ICD-10-CM | POA: Diagnosis not present

## 2020-09-09 DIAGNOSIS — M25572 Pain in left ankle and joints of left foot: Secondary | ICD-10-CM | POA: Diagnosis not present

## 2020-09-09 DIAGNOSIS — S86011D Strain of right Achilles tendon, subsequent encounter: Secondary | ICD-10-CM | POA: Diagnosis not present

## 2020-09-09 DIAGNOSIS — S86012D Strain of left Achilles tendon, subsequent encounter: Secondary | ICD-10-CM | POA: Diagnosis not present

## 2020-09-10 DIAGNOSIS — M25572 Pain in left ankle and joints of left foot: Secondary | ICD-10-CM | POA: Diagnosis not present

## 2020-09-11 DIAGNOSIS — M25572 Pain in left ankle and joints of left foot: Secondary | ICD-10-CM | POA: Diagnosis not present

## 2020-09-13 ENCOUNTER — Other Ambulatory Visit: Payer: Self-pay | Admitting: Internal Medicine

## 2020-09-16 DIAGNOSIS — L739 Follicular disorder, unspecified: Secondary | ICD-10-CM | POA: Diagnosis not present

## 2020-09-16 DIAGNOSIS — L578 Other skin changes due to chronic exposure to nonionizing radiation: Secondary | ICD-10-CM | POA: Diagnosis not present

## 2020-09-16 DIAGNOSIS — D2271 Melanocytic nevi of right lower limb, including hip: Secondary | ICD-10-CM | POA: Diagnosis not present

## 2020-09-16 DIAGNOSIS — D2272 Melanocytic nevi of left lower limb, including hip: Secondary | ICD-10-CM | POA: Diagnosis not present

## 2020-09-16 DIAGNOSIS — L821 Other seborrheic keratosis: Secondary | ICD-10-CM | POA: Diagnosis not present

## 2020-09-17 DIAGNOSIS — M810 Age-related osteoporosis without current pathological fracture: Secondary | ICD-10-CM | POA: Diagnosis not present

## 2020-09-23 DIAGNOSIS — Z23 Encounter for immunization: Secondary | ICD-10-CM | POA: Diagnosis not present

## 2020-10-01 ENCOUNTER — Other Ambulatory Visit: Payer: Self-pay | Admitting: Internal Medicine

## 2020-10-01 DIAGNOSIS — Z1231 Encounter for screening mammogram for malignant neoplasm of breast: Secondary | ICD-10-CM

## 2020-10-05 ENCOUNTER — Other Ambulatory Visit (HOSPITAL_COMMUNITY): Payer: Self-pay | Admitting: *Deleted

## 2020-10-05 DIAGNOSIS — H2513 Age-related nuclear cataract, bilateral: Secondary | ICD-10-CM | POA: Diagnosis not present

## 2020-10-05 DIAGNOSIS — H353132 Nonexudative age-related macular degeneration, bilateral, intermediate dry stage: Secondary | ICD-10-CM | POA: Diagnosis not present

## 2020-10-06 ENCOUNTER — Other Ambulatory Visit: Payer: Self-pay

## 2020-10-06 ENCOUNTER — Encounter (HOSPITAL_COMMUNITY)
Admission: RE | Admit: 2020-10-06 | Discharge: 2020-10-06 | Disposition: A | Discharge status: Home / Self Care | Payer: Medicare Other | Source: Ambulatory Visit | Attending: Sports Medicine | Admitting: Sports Medicine

## 2020-10-06 DIAGNOSIS — M8000XD Age-related osteoporosis with current pathological fracture, unspecified site, subsequent encounter for fracture with routine healing: Secondary | ICD-10-CM | POA: Diagnosis not present

## 2020-10-06 MED ORDER — ROMOSOZUMAB-AQQG 105 MG/1.17ML ~~LOC~~ SOSY
210.0000 mg | PREFILLED_SYRINGE | Freq: Once | SUBCUTANEOUS | Status: DC
  Administered 2020-10-06: 210 mg via SUBCUTANEOUS
  Filled 2020-10-06: qty 2.4

## 2020-10-06 NOTE — Discharge Instructions (Signed)
Romosozumab injection °What is this medicine? °ROMOSOZUMAB (roe moe SOZ ue mab) increases bone formation. It is used to treat osteoporosis in women. °This medicine may be used for other purposes; ask your health care provider or pharmacist if you have questions. °COMMON BRAND NAME(S): EVENITY °What should I tell my health care provider before I take this medicine? °They need to know if you have any of these conditions: °· dental disease or wear dentures °· heart disease °· history of stroke °· kidney disease °· low levels of calcium in the blood °· an unusual or allergic reaction to romosozumab, other medicines, foods, dyes or preservatives °· pregnant or trying to get pregnant °· breast-feeding °How should I use this medicine? °This medicine is for injection under the skin. It is given by a healthcare professional in a hospital or clinic setting. °Talk to your pediatrician about the use of this medicine in children. Special care may be needed. °Overdosage: If you think you have taken too much of this medicine contact a poison control center or emergency room at once. °NOTE: This medicine is only for you. Do not share this medicine with others. °What if I miss a dose? °Keep appointments for follow-up doses. It is important not to miss your dose. Call your doctor or healthcare professional if you are unable to keep an appointment. °What may interact with this medicine? °Interactions are not expected. °This list may not describe all possible interactions. Give your health care provider a list of all the medicines, herbs, non-prescription drugs, or dietary supplements you use. Also tell them if you smoke, drink alcohol, or use illegal drugs. Some items may interact with your medicine. °What should I watch for while using this medicine? °Your condition will be monitored carefully while you are receiving this medicine. °You may need blood work done while you are taking this medicine. °Some people who take this medicine  have severe bone, joint, or muscle pain. This medicine may also increase your risk for jaw problems or a broken thigh bone. Tell your healthcare professional right away if you have severe pain in your jaw, bones, joints, or muscles. Tell your healthcare professional if you have any pain that does not go away or that gets worse. °You should make sure you get enough calcium and vitamin D while you are taking this medicine. Discuss the foods you eat and the vitamins you take with your healthcare professional. °Tell your dentist and dental surgeon that you are taking this medicine. You should not have major dental surgery while on this medicine. See your dentist to have a dental exam and fix any dental problems before starting this medicine. Take good care of your teeth while on this medicine. Make sure you see your dentist for regular follow-up appointments. °What side effects may I notice from receiving this medicine? °Side effects that you should report to your doctor or health care professional as soon as possible: °· allergic reactions like skin rash, itching or hives, swelling of the face, lips, or tongue °· bone pain °· chest pain or chest tightness °· jaw pain, especially after dental work °· signs and symptoms of a stroke like changes in vision; confusion; trouble speaking or understanding; severe headaches; sudden numbness or weakness of the face, arm or leg; trouble walking; dizziness; loss of balance or coordination °· signs and symptoms of low calcium like fast heartbeat; muscle cramps; muscle pain; pain, tingling, numbness in the hands or feet; seizures °Side effects that usually do not require   medical attention (report these to your doctor or health care professional if they continue or are bothersome): °· headache °· joint pain °· pain, redness, or irritation at site where injected °· pain, tingling, numbness in the hands or feet °· swelling of ankles, feet, hands °· trouble sleeping °This list may not  describe all possible side effects. Call your doctor for medical advice about side effects. You may report side effects to FDA at 1-800-FDA-1088. °Where should I keep my medicine? °This medicine is given in a hospital or clinic and will not be stored at home. °NOTE: This sheet is a summary. It may not cover all possible information. If you have questions about this medicine, talk to your doctor, pharmacist, or health care provider. °© 2020 Elsevier/Gold Standard (2018-03-29 01:15:32) ° °

## 2020-10-08 DIAGNOSIS — M84375A Stress fracture, left foot, initial encounter for fracture: Secondary | ICD-10-CM | POA: Diagnosis not present

## 2020-10-28 ENCOUNTER — Encounter: Payer: Self-pay | Admitting: Internal Medicine

## 2020-10-28 ENCOUNTER — Ambulatory Visit (INDEPENDENT_AMBULATORY_CARE_PROVIDER_SITE_OTHER): Payer: Medicare Other | Admitting: Internal Medicine

## 2020-10-28 VITALS — BP 120/70 | HR 64 | Ht 63.0 in | Wt 113.0 lb

## 2020-10-28 DIAGNOSIS — K222 Esophageal obstruction: Secondary | ICD-10-CM | POA: Diagnosis not present

## 2020-10-28 DIAGNOSIS — R1310 Dysphagia, unspecified: Secondary | ICD-10-CM | POA: Diagnosis not present

## 2020-10-28 DIAGNOSIS — K219 Gastro-esophageal reflux disease without esophagitis: Secondary | ICD-10-CM

## 2020-10-28 MED ORDER — PANTOPRAZOLE SODIUM 40 MG PO TBEC
40.0000 mg | DELAYED_RELEASE_TABLET | Freq: Every day | ORAL | 3 refills | Status: DC
Start: 2020-10-28 — End: 2021-11-09

## 2020-10-28 NOTE — Progress Notes (Signed)
HISTORY OF PRESENT ILLNESS:  Dawn Tucker is a 74 y.o. female with past medical history as listed below.  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  She underwent upper endoscopy with esophageal dilation several occasions.  However, it was not until she was dilated with a 60 [place] Maloney dilator in January 2016 that she responded.  Dilation was performed for intermittent solid food dysphagia, severe at times.  She has been on chronic Dexilant therapy.  She actually did well until recently when she developed a severe episode of transient food impaction to bread.  Has had no problems since.  She inquires today about alternatives to Dexilant, secondary to cost.  She also has questions regarding medication side effects.  Finally, she wonders if repeat EGD with dilation might be in order.  Review of blood work from May 19, 2020 shows normal CBC with hemoglobin 12.7.  Her last complete colonoscopy for colon cancer screening was performed August 15, 2019.  There was mild focal nonspecific erythema.  Biopsies were read as ischemic change.  She was asymptomatic.  The exam was otherwise normal.  No routine follow-up recommended based on current guidelines.  She has completed her Covid vaccination series  REVIEW OF SYSTEMS:  All non-GI ROS negative unless otherwise stated in the HPI (entirely negative)  Past Medical History:  Diagnosis Date  . CHEST PAIN 03/23/2010  . DEGENERATIVE DISC DISEASE 08/07/2008  . Dysphagia   . DYSPHAGIA UNSPECIFIED 03/23/2010  . ESOPHAGEAL STRICTURE 01/10/2011  . FIBROIDS, UTERUS 08/07/2008  . GERD 03/23/2010  . Herpes   . HIATAL HERNIA 01/10/2011  . HYPOTHYROIDISM 08/07/2008  . IRRITABLE BOWEL SYNDROME, HX OF 08/07/2008  . Macular degeneration 2019   R mild dry  . Mixed hyperlipidemia 08/07/2008  . Osteoporosis 08/2019   T score -2.5 stable from prior DEXA  . PERSONAL HX COLONIC POLYPS  03/23/2010  . Scarlet fever 08/07/2008  . Unspecified pruritic disorder 08/07/2008  . VAGINITIS, ATROPHIC 08/07/2008    Past Surgical History:  Procedure Laterality Date  . BREAST EXCISIONAL BIOPSY Bilateral    benign  . BREAST SURGERY     Breast biopsies right & left- benign lesions  . CESAREAN SECTION    . COLONOSCOPY    . COLPOSCOPY    . GYNECOLOGIC CRYOSURGERY  by Dr. Dorathy Kinsman   low-grade dysplasia  . RHINOPLASTY    . TUBAL LIGATION      Social History Dawn Tucker  reports that she has never smoked. She has never used smokeless tobacco. She reports current alcohol use. She reports that she does not use drugs.  family history includes Breast cancer (age of onset: 19) in her paternal aunt; Breast cancer (age of onset: 62) in her sister; Coronary artery disease in her father; Heart disease (age of onset: 48) in her father; Heart disease (age of onset: 11) in her mother; Hypertension in her sister; Ovarian cancer in her paternal aunt; Pneumonia in her mother; Prostate cancer in her maternal grandfather.  Allergies  Allergen Reactions  . Sulfonamide Derivatives Other (See Comments)    REACTION AS A CHILD       PHYSICAL EXAMINATION: Vital signs: BP 120/70   Pulse 64   Ht 5\' 3"  (1.6 m)   Wt 113 lb (51.3 kg)   BMI 20.02 kg/m   Constitutional: generally well-appearing, no acute distress Psychiatric: alert and oriented x3, cooperative Eyes: extraocular movements intact, anicteric, conjunctiva  pink Mouth: oral pharynx moist, no lesions Neck: supple no lymphadenopathy Cardiovascular: heart regular rate and rhythm, no murmur Lungs: clear to auscultation bilaterally Abdomen: soft, nontender, nondistended, no obvious ascites, no peritoneal signs, normal bowel sounds, no organomegaly Rectal: Omitted Extremities: no clubbing, cyanosis, or lower extremity edema bilaterally Skin: no lesions on visible extremities Neuro: No focal deficits.  Cranial nerves  intact  ASSESSMENT:  1.  Recurrent intermittent solid food dysphagia likely secondary to the distal esophageal stricture/ring 2.  GERD.  Reflux symptoms controlled on once daily Dexilant.  Wants to try alternative secondary to cost. 3.  Colon cancer screening.  Last examinations negative for neoplasia.  Aged out   PLAN:  1.  Reflux precautions 2.  Prescribe pantoprazole 40 mg daily.  Medication risks reviewed 3.  Pantoprazole ineffective, may return to Dexilant 60 mg daily 4.  Schedule upper endoscopy with large caliber dilation of the esophagus.  Suspect she will require a 86 French dilator as previous.The nature of the procedure, as well as the risks, benefits, and alternatives were carefully and thoroughly reviewed with the patient. Ample time for discussion and questions allowed. The patient understood, was satisfied, and agreed to proceed.

## 2020-10-28 NOTE — Patient Instructions (Signed)
We have sent the following medications to your pharmacy for you to pick up at your convenience:  Pantoprazole  You have been scheduled for an endoscopy. Please follow written instructions given to you at your visit today. If you use inhalers (even only as needed), please bring them with you on the day of your procedure.   

## 2020-10-29 ENCOUNTER — Ambulatory Visit
Admission: RE | Admit: 2020-10-29 | Discharge: 2020-10-29 | Disposition: A | Discharge status: Home / Self Care | Payer: Medicare Other | Source: Ambulatory Visit | Attending: Internal Medicine | Admitting: Internal Medicine

## 2020-10-29 ENCOUNTER — Other Ambulatory Visit: Payer: Self-pay

## 2020-10-29 DIAGNOSIS — Z1231 Encounter for screening mammogram for malignant neoplasm of breast: Secondary | ICD-10-CM | POA: Diagnosis not present

## 2020-11-03 ENCOUNTER — Other Ambulatory Visit: Payer: Self-pay

## 2020-11-03 ENCOUNTER — Ambulatory Visit (HOSPITAL_COMMUNITY)
Admission: RE | Admit: 2020-11-03 | Discharge: 2020-11-03 | Disposition: A | Discharge status: Home / Self Care | Payer: Medicare Other | Source: Ambulatory Visit | Attending: Sports Medicine | Admitting: Sports Medicine

## 2020-11-03 DIAGNOSIS — M810 Age-related osteoporosis without current pathological fracture: Secondary | ICD-10-CM | POA: Insufficient documentation

## 2020-11-03 DIAGNOSIS — M8440XA Pathological fracture, unspecified site, initial encounter for fracture: Secondary | ICD-10-CM | POA: Insufficient documentation

## 2020-11-03 MED ORDER — ROMOSOZUMAB-AQQG 105 MG/1.17ML ~~LOC~~ SOSY
210.0000 mg | PREFILLED_SYRINGE | Freq: Once | SUBCUTANEOUS | Status: AC
  Administered 2020-11-03: 210 mg via SUBCUTANEOUS
  Filled 2020-11-03: qty 2.4

## 2020-11-05 DIAGNOSIS — M84375D Stress fracture, left foot, subsequent encounter for fracture with routine healing: Secondary | ICD-10-CM | POA: Diagnosis not present

## 2020-11-06 DIAGNOSIS — M7662 Achilles tendinitis, left leg: Secondary | ICD-10-CM | POA: Diagnosis not present

## 2020-11-09 ENCOUNTER — Ambulatory Visit (AMBULATORY_SURGERY_CENTER): Payer: Medicare Other | Admitting: Internal Medicine

## 2020-11-09 ENCOUNTER — Encounter: Payer: Self-pay | Admitting: Internal Medicine

## 2020-11-09 ENCOUNTER — Other Ambulatory Visit: Payer: Self-pay

## 2020-11-09 VITALS — BP 108/64 | HR 58 | Temp 97.5°F | Resp 16 | Ht 63.0 in | Wt 113.0 lb

## 2020-11-09 DIAGNOSIS — R1310 Dysphagia, unspecified: Secondary | ICD-10-CM | POA: Diagnosis not present

## 2020-11-09 DIAGNOSIS — K2100 Gastro-esophageal reflux disease with esophagitis, without bleeding: Secondary | ICD-10-CM

## 2020-11-09 DIAGNOSIS — K449 Diaphragmatic hernia without obstruction or gangrene: Secondary | ICD-10-CM | POA: Diagnosis not present

## 2020-11-09 DIAGNOSIS — K219 Gastro-esophageal reflux disease without esophagitis: Secondary | ICD-10-CM | POA: Diagnosis not present

## 2020-11-09 DIAGNOSIS — K222 Esophageal obstruction: Secondary | ICD-10-CM

## 2020-11-09 MED ORDER — SODIUM CHLORIDE 0.9 % IV SOLN: 500.0000 mL | Freq: Once | INTRAVENOUS | Status: DC

## 2020-11-09 NOTE — Progress Notes (Signed)
PT taken to PACU. Monitors in place. VSS. Report given to RN. 

## 2020-11-09 NOTE — Progress Notes (Signed)
Called to room to assist during endoscopic procedure.  Patient ID and intended procedure confirmed with present staff. Received instructions for my participation in the procedure from the performing physician.  

## 2020-11-09 NOTE — Op Note (Signed)
[facility] Patient Name: Dawn Tucker Procedure Date: 11/09/2020 10:07 AM MRN: [id number] Endoscopist: Docia Chuck. Henrene Pastor , MD Age: 74 Referring MD:  Date of Birth: [birth year] Gender: Female Account #: [id number] Procedure:                Upper GI endoscopy with balloon dilation of the                            esophagus to 20 mm Indications:              Therapeutic procedure, Dysphagia. Multiple prior                            endoscopies with dilation. Most recent January 2016                            (16 French (20 mm) Maloney dilator) Medicines:                Monitored Anesthesia Care Procedure:                Pre-Anesthesia Assessment:                           - Prior to the procedure, a History and Physical                            was performed, and patient medications and                            allergies were reviewed. The patient's tolerance of                            previous anesthesia was also reviewed. The risks                            and benefits of the procedure and the sedation                            options and risks were discussed with the patient.                            All questions were answered, and informed consent                            was obtained. Prior Anticoagulants: The patient has                            taken no previous anticoagulant or antiplatelet                            agents. ASA Grade Assessment: I - A normal, healthy                            patient. After reviewing the risks and benefits,  the patient was deemed in satisfactory condition to                            undergo the procedure.                           After obtaining informed consent, the endoscope was                            passed under direct vision. Throughout the                            procedure, the patient's blood pressure, pulse, and                            oxygen saturations were  monitored continuously. The                            Endoscope was introduced through the mouth, and                            advanced to the second part of duodenum. The upper                            GI endoscopy was accomplished without difficulty.                            The patient tolerated the procedure well. Scope In: Scope Out: Findings:                 The esophagus a large caliber ringlike stricture at                            the gastroesophageal junction (38 cm). The mouth                            would not accommodate [facility] dilation. Thus, a TTS                            dilator was passed through the scope. Dilation with                            an 18-19-20 mm balloon dilator was performed to 20                            mm.                           The stomach revealed a small hiatal hernia. As                            well, incidental benign fundic gland type polyps.                           The  examined duodenum was normal.                           The cardia and gastric fundus were normal on                            retroflexion. Complications:            No immediate complications. Estimated Blood Loss:     Estimated blood loss: none. Impression:               1. 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 Incidental benign fundic gland polyps. Otherwise                            normal EGD. Recommendation:           1. Patient has a contact number available for                            emergencies. The signs and symptoms of potential                            delayed complications were discussed with the                            patient. Return to normal activities tomorrow.                            Written discharge instructions were provided to the                            patient.                           2. Post dilation diet.                           3.  Continue present medications.                           4. Any further difficulties with swallowing, please                            contact Dr. Henrene Pastor. Docia Chuck. Henrene Pastor, MD 11/09/2020 10:44:36 AM This report has been signed electronically.

## 2020-11-09 NOTE — Progress Notes (Signed)
Pt's states no medical or surgical changes since previsit or office visit. 

## 2020-11-09 NOTE — Patient Instructions (Signed)
You may resume your medications. Follow the diet instructions. Read all of the handouts given to you by your recovery room nurse.  YOU HAD AN ENDOSCOPIC PROCEDURE TODAY AT [facility]:   Refer to the procedure report that was given to you for any specific questions about what was found during the examination.  If the procedure report does not answer your questions, please call your gastroenterologist to clarify.  If you requested that your care partner not be given the details of your procedure findings, then the procedure report has been included in a sealed envelope for you to review at your convenience later.  YOU SHOULD EXPECT: Some feelings of bloating in the abdomen. Passage of more gas than usual.  Walking can help get rid of the air that was put into your GI tract during the procedure and reduce the bloating.   Please Note:  You might notice some irritation and congestion in your nose or some drainage.  This is from the oxygen used during your procedure.  There is no need for concern and it should clear up in a day or so.  SYMPTOMS TO REPORT IMMEDIATELY:    Following upper endoscopy (EGD)  Vomiting of blood or coffee ground material  New chest pain or pain under the shoulder blades  Painful or persistently difficult swallowing  New shortness of breath  Fever of 100F or higher  Black, tarry-looking stools  For urgent or emergent issues, a gastroenterologist can be reached at any hour by calling [phone]. Do not use MyChart messaging for urgent concerns.    DIET:  We do recommend nothing by mouth until 11:30 A M.  Then clear liquids for an hour.   Then you may proceed to your regular diet. You may have a regular diet tomorrow. Drink plenty of fluids but you should avoid alcoholic beverages for 24 hours.  ACTIVITY:  You should plan to take it easy for the rest of today and you should NOT DRIVE or use heavy machinery until tomorrow (because of the sedation  medicines used during the test).    FOLLOW UP: Our staff will call the number listed on your records 48-72 hours following your procedure to check on you and address any questions or concerns that you may have regarding the information given to you following your procedure. If we do not reach you, we will leave a message.  We will attempt to reach you two times.  During this call, we will ask if you have developed any symptoms of COVID 19. If you develop any symptoms (ie: fever, flu-like symptoms, shortness of breath, cough etc.) before then, please call [phone].  If you test positive for Covid 19 in the 2 weeks post procedure, please call and report this information to [place].     SIGNATURES/CONFIDENTIALITY: You and/or your care partner have signed paperwork which will be entered into your electronic medical record.  These signatures attest to the fact that that the information above on your After Visit Summary has been reviewed and is understood.  Full responsibility of the confidentiality of this discharge information lies with you and/or your care-partner.

## 2020-11-10 ENCOUNTER — Telehealth: Payer: Self-pay

## 2020-11-10 NOTE — Telephone Encounter (Signed)
Second follow up call attempt, no answer LM 

## 2020-11-18 ENCOUNTER — Telehealth: Payer: Self-pay | Admitting: *Deleted

## 2020-11-18 ENCOUNTER — Encounter: Payer: Self-pay | Admitting: Internal Medicine

## 2020-11-18 ENCOUNTER — Ambulatory Visit (INDEPENDENT_AMBULATORY_CARE_PROVIDER_SITE_OTHER): Payer: Medicare Other | Admitting: Internal Medicine

## 2020-11-18 ENCOUNTER — Other Ambulatory Visit: Payer: Self-pay

## 2020-11-18 VITALS — BP 112/62 | HR 72 | Temp 98.0°F | Wt 113.0 lb

## 2020-11-18 DIAGNOSIS — F4321 Adjustment disorder with depressed mood: Secondary | ICD-10-CM | POA: Diagnosis not present

## 2020-11-18 DIAGNOSIS — I10 Essential (primary) hypertension: Secondary | ICD-10-CM

## 2020-11-18 DIAGNOSIS — M84371S Stress fracture, right ankle, sequela: Secondary | ICD-10-CM

## 2020-11-18 DIAGNOSIS — F5101 Primary insomnia: Secondary | ICD-10-CM

## 2020-11-18 DIAGNOSIS — M810 Age-related osteoporosis without current pathological fracture: Secondary | ICD-10-CM | POA: Diagnosis not present

## 2020-11-18 DIAGNOSIS — E039 Hypothyroidism, unspecified: Secondary | ICD-10-CM | POA: Diagnosis not present

## 2020-11-18 DIAGNOSIS — M84373A Stress fracture, unspecified ankle, initial encounter for fracture: Secondary | ICD-10-CM | POA: Insufficient documentation

## 2020-11-18 LAB — LIPID PANEL
Cholesterol: 196 mg/dL (ref 0–200)
HDL: 58.6 mg/dL (ref >39.00)
LDL Cholesterol: 101 mg/dL — ABNORMAL HIGH (ref 0–99)
NonHDL: 137.15
Total CHOL/HDL Ratio: 3
Triglycerides: 181 mg/dL — ABNORMAL HIGH (ref 0.0–149.0)
VLDL: 36.2 mg/dL (ref 0.0–40.0)

## 2020-11-18 LAB — CBC WITH DIFFERENTIAL/PLATELET
Basophils Absolute: 0 10*3/uL (ref 0.0–0.1)
Basophils Relative: 0.4 % (ref 0.0–3.0)
Eosinophils Absolute: 0.1 10*3/uL (ref 0.0–0.7)
Eosinophils Relative: 1.4 % (ref 0.0–5.0)
HCT: 38.5 % (ref 36.0–46.0)
Hemoglobin: 12.8 g/dL (ref 12.0–15.0)
Lymphocytes Relative: 20.7 % (ref 12.0–46.0)
Lymphs Abs: 1.6 10*3/uL (ref 0.7–4.0)
MCHC: 33.2 g/dL (ref 30.0–36.0)
MCV: 91.4 fl (ref 78.0–100.0)
Monocytes Absolute: 0.6 10*3/uL (ref 0.1–1.0)
Monocytes Relative: 7.4 % (ref 3.0–12.0)
Neutro Abs: 5.4 10*3/uL (ref 1.4–7.7)
Neutrophils Relative %: 70.1 % (ref 43.0–77.0)
Platelets: 249 10*3/uL (ref 150.0–400.0)
RBC: 4.21 Mil/uL (ref 3.87–5.11)
RDW: 13.1 % (ref 11.5–15.5)
WBC: 7.6 10*3/uL (ref 4.0–10.5)

## 2020-11-18 LAB — URINALYSIS
Bilirubin Urine: NEGATIVE
Hgb urine dipstick: NEGATIVE
Ketones, ur: NEGATIVE
Leukocytes,Ua: NEGATIVE
Nitrite: NEGATIVE
Specific Gravity, Urine: 1.02 (ref 1.000–1.030)
Total Protein, Urine: NEGATIVE
Urine Glucose: NEGATIVE
Urobilinogen, UA: 0.2 (ref 0.0–1.0)
pH: 6 (ref 5.0–8.0)

## 2020-11-18 LAB — COMPREHENSIVE METABOLIC PANEL
ALT: 28 U/L (ref 0–35)
AST: 25 U/L (ref 0–37)
Albumin: 4.3 g/dL (ref 3.5–5.2)
Alkaline Phosphatase: 112 U/L (ref 39–117)
BUN: 17 mg/dL (ref 6–23)
CO2: 32 mEq/L (ref 19–32)
Calcium: 9.3 mg/dL (ref 8.4–10.5)
Chloride: 102 mEq/L (ref 96–112)
Creatinine, Ser: 0.99 mg/dL (ref 0.40–1.20)
GFR: 56.34 mL/min — ABNORMAL LOW (ref >60.00)
Glucose, Bld: 106 mg/dL — ABNORMAL HIGH (ref 70–99)
Potassium: 4 mEq/L (ref 3.5–5.1)
Sodium: 139 mEq/L (ref 135–145)
Total Bilirubin: 0.4 mg/dL (ref 0.2–1.2)
Total Protein: 7.3 g/dL (ref 6.0–8.3)

## 2020-11-18 LAB — TSH: TSH: 0.51 u[IU]/mL (ref 0.35–4.50)

## 2020-11-18 MED ORDER — LOSARTAN POTASSIUM 25 MG PO TABS
25.0000 mg | ORAL_TABLET | Freq: Every day | ORAL | 3 refills | Status: DC
Start: 2020-11-18 — End: 2021-12-02

## 2020-11-18 MED ORDER — ZOLPIDEM TARTRATE ER 6.25 MG PO TBCR: EXTENDED_RELEASE_TABLET | ORAL | 1 refills | Status: DC

## 2020-11-18 MED ORDER — ESCITALOPRAM OXALATE 5 MG PO TABS: ORAL_TABLET | ORAL | 3 refills | Status: DC

## 2020-11-18 NOTE — Assessment & Plan Note (Signed)
Zolpidem prn  Potential benefits of a long term benzodiazepines  use as well as potential risks  and complications were explained to the patient and were aknowledged. 

## 2020-11-18 NOTE — Addendum Note (Signed)
Addended by: Cresenciano Lick on: 11/18/2020 02:11 PM   Modules accepted: Orders

## 2020-11-18 NOTE — Progress Notes (Signed)
Subjective:  Patient ID: Dawn Tucker, female    DOB: 1946-07-04  Age: 74 y.o. MRN: [id number]  CC: Follow-up (6 month f/u)   HPI Dawn Tucker presents for HTN, anxiety, GERD  C/o L ankle stress fx - pt had an MRI (Dr Noemi Chapel) - on [place]  Outpatient Medications Prior to Visit  Medication Sig Dispense Refill  . ALPRAZolam (XANAX) 0.25 MG tablet Take 1 tablet (0.25 mg total) by mouth 2 (two) times daily as needed for anxiety. 30 tablet 1  . aspirin EC 81 MG tablet Take 81 mg by mouth daily.    Marland Kitchen atorvastatin (LIPITOR) 10 MG tablet TAKE 1 TABLET(10 MG) BY MOUTH DAILY 90 tablet 0  . Ca Carbonate-Mag Hydroxide (ROLAIDS) 550-110 MG CHEW Chew by mouth. Take 2 chews every day    . Cholecalciferol (VITAMIN D-3) 125 MCG (5000 UT) TABS Take 1 tablet by mouth daily.    Marland Kitchen dexlansoprazole (DEXILANT) 60 MG capsule TAKE 1 CAPSULE BY MOUTH DAILY 90 capsule 0  . diclofenac sodium (VOLTAREN) 1 % GEL Apply 1 application topically 4 (four) times daily. 100 g 0  . escitalopram (LEXAPRO) 5 MG tablet TAKE 1 TABLET(5 MG) BY MOUTH DAILY 90 tablet 0  . lidocaine (LIDODERM) 5 % Place 1 patch onto the skin daily. Remove & Discard patch within 12 hours or as directed by MD 30 patch 0  . losartan (COZAAR) 50 MG tablet TAKE 1/2 TABLET BY MOUTH DAILY 90 tablet 1  . meloxicam (MOBIC) 15 MG tablet Take 15 mg by mouth daily as needed. Take 1 tablet by mouth daily    . Multiple Vitamin (MULTIVITAMIN) tablet Take 1 tablet by mouth daily.      . ondansetron (ZOFRAN) 4 MG tablet Take 1 tablet (4 mg total) by mouth every 8 (eight) hours as needed for nausea or vomiting. 20 tablet 0  . pantoprazole (PROTONIX) 40 MG tablet Take 1 tablet (40 mg total) by mouth daily. 90 tablet 3  . Romosozumab-aqqg (EVENITY) 105 MG/1.17ML SOSY injection Inject 210 mg into the skin every 30 (thirty) days.    Marland Kitchen SYNTHROID 88 MCG tablet Take 1 tablet (88 mcg total) by mouth daily before breakfast. 90 tablet 3  . valACYclovir (VALTREX) 500  MG tablet Take 1 tablet (500 mg total) by mouth daily. 90 tablet 3  . zolpidem (AMBIEN CR) 6.25 MG CR tablet TAKE 1 TABLET(6.25 MG) BY MOUTH AT BEDTIME AS NEEDED FOR SLEEP 30 tablet 5  . Calcium-Vitamin D-Vitamin K 751-025-85 MG-UNT-MCG CHEW Chew by mouth daily. Reported on 01/26/2016 (Patient not taking: Reported on 11/18/2020)    . 0.9 %  sodium chloride infusion      No facility-administered medications prior to visit.    ROS: Review of Systems  Constitutional: Negative for activity change, appetite change, chills, fatigue and unexpected weight change.  HENT: Negative for congestion, mouth sores and sinus pressure.   Eyes: Negative for visual disturbance.  Respiratory: Negative for cough and chest tightness.   Gastrointestinal: Negative for abdominal pain and nausea.  Genitourinary: Negative for difficulty urinating, frequency and vaginal pain.  Musculoskeletal: Positive for arthralgias. Negative for back pain and gait problem.  Skin: Negative for pallor and rash.  Neurological: Negative for dizziness, tremors, weakness, numbness and headaches.  Psychiatric/Behavioral: Positive for sleep disturbance. Negative for confusion.    Objective:  BP 112/62 (BP Location: Left Arm)   Pulse 72   Temp 98 F (36.7 C) (Oral)   Wt 113 lb (51.3 kg)  SpO2 96%   BMI 20.02 kg/m   BP Readings from Last 3 Encounters:  11/18/20 112/62  11/09/20 108/64  11/03/20 134/76    Wt Readings from Last 3 Encounters:  11/18/20 113 lb (51.3 kg)  11/09/20 113 lb (51.3 kg)  10/28/20 113 lb (51.3 kg)    Physical Exam Constitutional:      General: She is not in acute distress.    Appearance: She is well-developed.  HENT:     Head: Normocephalic.     Right Ear: External ear normal.     Left Ear: External ear normal.     Nose: Nose normal.  Eyes:     General:        Right eye: No discharge.        Left eye: No discharge.     Conjunctiva/sclera: Conjunctivae normal.     Pupils: Pupils are equal,  round, and reactive to light.  Neck:     Thyroid: No thyromegaly.     Vascular: No JVD.     Trachea: No tracheal deviation.  Cardiovascular:     Rate and Rhythm: Normal rate and regular rhythm.     Heart sounds: Normal heart sounds.  Pulmonary:     Effort: No respiratory distress.     Breath sounds: No stridor. No wheezing.  Abdominal:     General: Bowel sounds are normal. There is no distension.     Palpations: Abdomen is soft. There is no mass.     Tenderness: There is no abdominal tenderness. There is no guarding or rebound.  Musculoskeletal:        General: No tenderness.     Cervical back: Normal range of motion and neck supple.  Lymphadenopathy:     Cervical: No cervical adenopathy.  Skin:    Findings: No erythema or rash.  Neurological:     Mental Status: She is oriented to person, place, and time.     Cranial Nerves: No cranial nerve deficit.     Motor: No abnormal muscle tone.     Coordination: Coordination normal.     Deep Tendon Reflexes: Reflexes normal.  Psychiatric:        Behavior: Behavior normal.        Thought Content: Thought content normal.        Judgment: Judgment normal.     Lab Results  Component Value Date   WBC 6.4 05/19/2020   HGB 12.7 05/19/2020   HCT 37.7 05/19/2020   PLT 231.0 05/19/2020   GLUCOSE 100 (H) 05/19/2020   CHOL 194 05/19/2020   TRIG 114.0 05/19/2020   HDL 57.20 05/19/2020   LDLCALC 114 (H) 05/19/2020   ALT 31 05/19/2020   AST 26 05/19/2020   NA 135 05/19/2020   K 4.9 05/19/2020   CL 99 05/19/2020   CREATININE 1.13 05/19/2020   BUN 25 (H) 05/19/2020   CO2 29 05/19/2020   TSH 0.87 05/19/2020   HGBA1C 5.9 10/02/2015    No results found.  Assessment & Plan:   There are no diagnoses linked to this encounter.   No orders of the defined types were placed in this encounter.    Follow-up: No follow-ups on file.  Walker Kehr, MD

## 2020-11-18 NOTE — Telephone Encounter (Signed)
Rec'd fax stating medication Zolpidem Er 6.25 mg is not covered by insurance plan. The preferred alternatives are Trazodone, or Belsomra. Pls advise if you want to change or proceed w/PA.Marland KitchenJohny Chess

## 2020-11-18 NOTE — Assessment & Plan Note (Signed)
Dawn Tucker died on Jul 03, 2020

## 2020-11-18 NOTE — Assessment & Plan Note (Signed)
R ankle stress fx - pt had an MRI (Dr Noemi Chapel) - on [place]

## 2020-11-18 NOTE — Assessment & Plan Note (Signed)
Losartan BP Readings from Last 3 Encounters:  11/18/20 112/62  11/09/20 108/64  11/03/20 134/76

## 2020-11-18 NOTE — Assessment & Plan Note (Addendum)
L ankle stress fx - pt had an MRI (Dr Noemi Chapel) - on Dawn Tucker

## 2020-11-19 NOTE — Telephone Encounter (Signed)
Please ask the patient if she is willing to switch.  Regular zolpidem may be covered or inexpensive.  Thanks

## 2020-11-19 NOTE — Telephone Encounter (Signed)
Notified pt w/Md response. Pt is going to check to see the cost w/o her insurance. If expensive she will email MD to let him which once she decided for the preferred medication .Marland KitchenJohny Chess

## 2020-11-27 NOTE — Telephone Encounter (Signed)
The two preferred alternative was Trazodone, or Belsomra.Marland KitchenJohny Tucker

## 2020-11-30 ENCOUNTER — Other Ambulatory Visit: Payer: Self-pay | Admitting: Internal Medicine

## 2020-11-30 ENCOUNTER — Other Ambulatory Visit (HOSPITAL_COMMUNITY): Payer: Self-pay

## 2020-11-30 MED ORDER — TRAZODONE HCL 50 MG PO TABS: 25.0000 mg | ORAL_TABLET | Freq: Every day | ORAL | 3 refills | Status: DC

## 2020-12-01 ENCOUNTER — Inpatient Hospital Stay (HOSPITAL_COMMUNITY): Admission: RE | Admit: 2020-12-01 | Payer: Medicare Other | Source: Ambulatory Visit

## 2020-12-01 ENCOUNTER — Other Ambulatory Visit: Payer: Self-pay | Admitting: Internal Medicine

## 2020-12-07 ENCOUNTER — Other Ambulatory Visit: Payer: Self-pay

## 2020-12-07 ENCOUNTER — Ambulatory Visit (HOSPITAL_COMMUNITY)
Admission: RE | Admit: 2020-12-07 | Discharge: 2020-12-07 | Disposition: A | Discharge status: Home / Self Care | Payer: Medicare Other | Source: Ambulatory Visit | Attending: Sports Medicine | Admitting: Sports Medicine

## 2020-12-07 DIAGNOSIS — M8000XA Age-related osteoporosis with current pathological fracture, unspecified site, initial encounter for fracture: Secondary | ICD-10-CM | POA: Diagnosis not present

## 2020-12-07 MED ORDER — ROMOSOZUMAB-AQQG 105 MG/1.17ML ~~LOC~~ SOSY
210.0000 mg | PREFILLED_SYRINGE | Freq: Once | SUBCUTANEOUS | Status: AC
  Administered 2020-12-07: 08:00:00 210 mg via SUBCUTANEOUS
  Filled 2020-12-07: qty 2.4

## 2020-12-25 DIAGNOSIS — M7662 Achilles tendinitis, left leg: Secondary | ICD-10-CM | POA: Diagnosis not present

## 2020-12-29 ENCOUNTER — Other Ambulatory Visit: Payer: Self-pay

## 2020-12-29 ENCOUNTER — Encounter (HOSPITAL_COMMUNITY): Payer: Medicare Other

## 2020-12-29 ENCOUNTER — Ambulatory Visit (HOSPITAL_COMMUNITY)
Admission: RE | Admit: 2020-12-29 | Discharge: 2020-12-29 | Disposition: A | Discharge status: Home / Self Care | Payer: Medicare Other | Source: Ambulatory Visit | Attending: Sports Medicine | Admitting: Sports Medicine

## 2020-12-29 DIAGNOSIS — M810 Age-related osteoporosis without current pathological fracture: Secondary | ICD-10-CM | POA: Insufficient documentation

## 2020-12-29 MED ORDER — ROMOSOZUMAB-AQQG 105 MG/1.17ML ~~LOC~~ SOSY
210.0000 mg | PREFILLED_SYRINGE | Freq: Once | SUBCUTANEOUS | Status: DC
  Administered 2020-12-29: 210 mg via SUBCUTANEOUS
  Filled 2020-12-29: qty 2.4

## 2020-12-30 DIAGNOSIS — S86012D Strain of left Achilles tendon, subsequent encounter: Secondary | ICD-10-CM | POA: Diagnosis not present

## 2020-12-30 DIAGNOSIS — M8438XD Stress fracture, other site, subsequent encounter for fracture with routine healing: Secondary | ICD-10-CM | POA: Diagnosis not present

## 2021-01-02 ENCOUNTER — Encounter (HOSPITAL_COMMUNITY): Payer: Self-pay

## 2021-01-02 ENCOUNTER — Other Ambulatory Visit: Payer: Self-pay

## 2021-01-02 ENCOUNTER — Ambulatory Visit (HOSPITAL_COMMUNITY): Admit: 2021-01-02 | Discharge status: Absent without leave | Payer: Medicare Other

## 2021-01-02 ENCOUNTER — Ambulatory Visit (HOSPITAL_COMMUNITY)
Admission: EM | Admit: 2021-01-02 | Discharge: 2021-01-02 | Disposition: A | Discharge status: Home / Self Care | Payer: Medicare Other | Attending: Family Medicine | Admitting: Family Medicine

## 2021-01-02 DIAGNOSIS — N3090 Cystitis, unspecified without hematuria: Secondary | ICD-10-CM

## 2021-01-02 DIAGNOSIS — N3000 Acute cystitis without hematuria: Secondary | ICD-10-CM | POA: Diagnosis not present

## 2021-01-02 LAB — POCT URINALYSIS DIPSTICK, ED / UC
Bilirubin Urine: NEGATIVE
Glucose, UA: NEGATIVE mg/dL
Hgb urine dipstick: NEGATIVE
Ketones, ur: NEGATIVE mg/dL
Nitrite: NEGATIVE
Protein, ur: NEGATIVE mg/dL
Specific Gravity, Urine: 1.01 (ref 1.005–1.030)
Urobilinogen, UA: 0.2 mg/dL (ref 0.0–1.0)
pH: 6 (ref 5.0–8.0)

## 2021-01-02 MED ORDER — CEPHALEXIN 500 MG PO CAPS: 500.0000 mg | ORAL_CAPSULE | Freq: Two times a day (BID) | ORAL | 0 refills | Status: DC

## 2021-01-02 NOTE — ED Triage Notes (Signed)
Pt reports lower abdominal pressure, foamy and cloudy urine x 1 day. Denies fever, chills, back pain.

## 2021-01-02 NOTE — ED Provider Notes (Signed)
Dawn Tucker    ASSESSMENT & PLAN:  1. Cystitis     Begin: Meds ordered this encounter  Medications  . cephALEXin (KEFLEX) 500 MG capsule    Sig: Take 1 capsule (500 mg total) by mouth 2 (two) times daily.    Dispense:  10 capsule    Refill:  0    No signs of pyelonephritis. Discussed. Urine culture sent. Will follow up with her PCP or here if not showing improvement over the next 48 hours, sooner if needed.  Outlined signs and symptoms indicating need for more acute intervention. Patient verbalized understanding. After Visit Summary given.  SUBJECTIVE:  Dawn Tucker is a 75 y.o. female who complains of cloudy and foamy urine with lower midline abd pressure; x 1 day; similar symptoms with UTI in the past. for the past. Without associated flank pain, fever, chills, vaginal discharge or bleeding. Gross hematuria: not present. No specific aggravating or alleviating factors reported. No LE edema. Normal PO intake without n/v/d. Without specific abdominal pain. Ambulatory without difficulty. OTC treatment: none.  LMP: No LMP recorded. Patient is postmenopausal.    OBJECTIVE:  Vitals:   01/02/21 1255  BP: (!) 149/87  Pulse: 73  Resp: 16  Temp: 97.6 F (36.4 C)  TempSrc: Oral  SpO2: 100%   General appearance: alert; no distress Lungs: unlabored respirations Abdomen: soft Back: no CVA tenderness Extremities: no edema; symmetrical with no gross deformities Skin: warm and dry Neurologic: normal gait Psychological: alert and cooperative; normal mood and affect  Labs Reviewed  POCT URINALYSIS DIPSTICK, ED / UC - Abnormal; Notable for the following components:      Result Value   Leukocytes,Ua TRACE (*)    All other components within normal limits  URINE CULTURE    Allergies  Allergen Reactions  . Sulfonamide Derivatives Other (See Comments)    REACTION AS A CHILD    Past Medical History:  Diagnosis Date  . CHEST PAIN 03/23/2010  .  DEGENERATIVE DISC DISEASE 08/07/2008  . Dysphagia   . DYSPHAGIA UNSPECIFIED 03/23/2010  . ESOPHAGEAL STRICTURE 01/10/2011  . FIBROIDS, UTERUS 08/07/2008  . GERD 03/23/2010  . Herpes   . HIATAL HERNIA 01/10/2011  . HYPOTHYROIDISM 08/07/2008  . IRRITABLE BOWEL SYNDROME, HX OF 08/07/2008  . Macular degeneration 2019   R mild dry  . Mixed hyperlipidemia 08/07/2008  . Osteoporosis 08/2019   T score -2.5 stable from prior DEXA  . PERSONAL HX COLONIC POLYPS 03/23/2010  . Scarlet fever 08/07/2008  . Unspecified pruritic disorder 08/07/2008  . VAGINITIS, ATROPHIC 08/07/2008   Social History   Socioeconomic History  . Marital status: Widowed    Spouse name: Not on file  . Number of children: 2  . Years of education: Not on file  . Highest education level: Not on file  Occupational History    Employer: UNEMPLOYED  Tobacco Use  . Smoking status: Never Smoker  . Smokeless tobacco: Never Used  Vaping Use  . Vaping Use: Never used  Substance and Sexual Activity  . Alcohol use: Yes    Alcohol/week: 0.0 standard drinks    Comment: occassional  . Drug use: No  . Sexual activity: Not Currently    Partners: Male    Birth control/protection: Post-menopausal    Comment: INTERCOURSE AGE 63, LESS THAN 5 SEXUAL PARTNERS  Other Topics Concern  . Not on file  Social History Narrative   HSG, [employer]. Married 29'. 1 son- '79, 1 daughter- '71; 2 grandchildren. Marriage  is in good health.Very active in community affairs   Social Determinants of Health   Financial Resource Strain: Not on file  Food Insecurity: Not on file  Transportation Needs: Not on file  Physical Activity: Not on file  Stress: Not on file  Social Connections: Not on file  Intimate Partner Violence: Not on file   Family History  Problem Relation Age of Onset  . Pneumonia Mother   . Heart disease Mother 35       CAD  . Coronary artery disease Father   . Heart disease Father 83       CAD/MI-fatal  . Breast cancer Paternal  Aunt 62  . Ovarian cancer Paternal Aunt        dx in her 75s  . Prostate cancer Maternal Grandfather   . Breast cancer Sister 73       BRCA negative  . Hypertension Sister   . Diabetes Neg Hx   . Colon cancer Neg Hx   . Rectal cancer Neg Hx   . Stomach cancer Neg Hx   . Esophageal cancer Neg Hx        Vanessa Kick, MD 01/04/21 1220

## 2021-01-03 LAB — URINE CULTURE: Culture: NO GROWTH

## 2021-01-06 DIAGNOSIS — M8438XD Stress fracture, other site, subsequent encounter for fracture with routine healing: Secondary | ICD-10-CM | POA: Diagnosis not present

## 2021-01-06 DIAGNOSIS — S86012D Strain of left Achilles tendon, subsequent encounter: Secondary | ICD-10-CM | POA: Diagnosis not present

## 2021-01-15 DIAGNOSIS — S86012D Strain of left Achilles tendon, subsequent encounter: Secondary | ICD-10-CM | POA: Diagnosis not present

## 2021-01-15 DIAGNOSIS — M8438XD Stress fracture, other site, subsequent encounter for fracture with routine healing: Secondary | ICD-10-CM | POA: Diagnosis not present

## 2021-01-18 DIAGNOSIS — M8438XD Stress fracture, other site, subsequent encounter for fracture with routine healing: Secondary | ICD-10-CM | POA: Diagnosis not present

## 2021-01-18 DIAGNOSIS — S86012D Strain of left Achilles tendon, subsequent encounter: Secondary | ICD-10-CM | POA: Diagnosis not present

## 2021-01-26 ENCOUNTER — Other Ambulatory Visit: Payer: Self-pay

## 2021-01-26 ENCOUNTER — Ambulatory Visit (HOSPITAL_COMMUNITY)
Admission: RE | Admit: 2021-01-26 | Discharge: 2021-01-26 | Disposition: A | Discharge status: Home / Self Care | Payer: Medicare Other | Source: Ambulatory Visit | Attending: Sports Medicine | Admitting: Sports Medicine

## 2021-01-26 DIAGNOSIS — M810 Age-related osteoporosis without current pathological fracture: Secondary | ICD-10-CM | POA: Diagnosis not present

## 2021-01-26 MED ORDER — ROMOSOZUMAB-AQQG 105 MG/1.17ML ~~LOC~~ SOSY
210.0000 mg | PREFILLED_SYRINGE | Freq: Once | SUBCUTANEOUS | Status: AC
  Administered 2021-01-26: 210 mg via SUBCUTANEOUS
  Filled 2021-01-26: qty 2.4

## 2021-01-29 DIAGNOSIS — S86012D Strain of left Achilles tendon, subsequent encounter: Secondary | ICD-10-CM | POA: Diagnosis not present

## 2021-01-29 DIAGNOSIS — M8438XD Stress fracture, other site, subsequent encounter for fracture with routine healing: Secondary | ICD-10-CM | POA: Diagnosis not present

## 2021-02-01 DIAGNOSIS — M8438XD Stress fracture, other site, subsequent encounter for fracture with routine healing: Secondary | ICD-10-CM | POA: Diagnosis not present

## 2021-02-01 DIAGNOSIS — S86012D Strain of left Achilles tendon, subsequent encounter: Secondary | ICD-10-CM | POA: Diagnosis not present

## 2021-02-08 DIAGNOSIS — M7662 Achilles tendinitis, left leg: Secondary | ICD-10-CM | POA: Diagnosis not present

## 2021-02-14 IMAGING — US US RENAL
1 series · 14 of 25 positions shown · non-contrast
Comparison: 08/13/2014

CLINICAL DATA: Renal insufficiency.

EXAM:
RENAL / URINARY TRACT ULTRASOUND COMPLETE

[Series 1: us renal · 0.19mm/px · 14 of 49 slices shown]
[im 1/49]
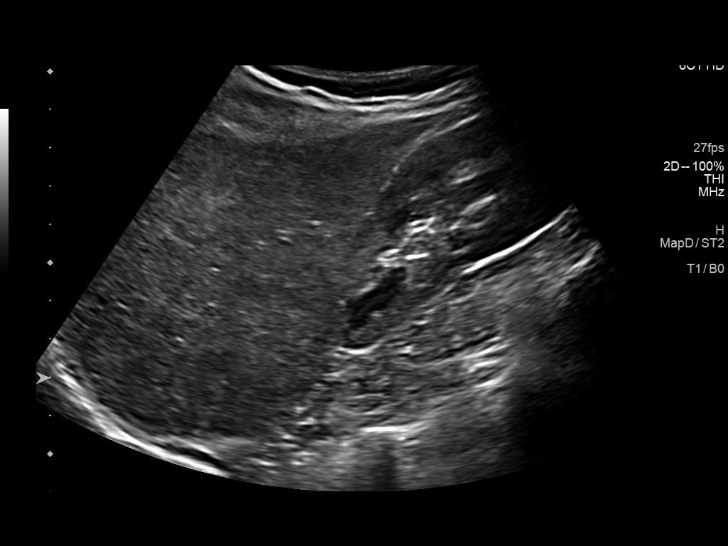
[im 5/49]
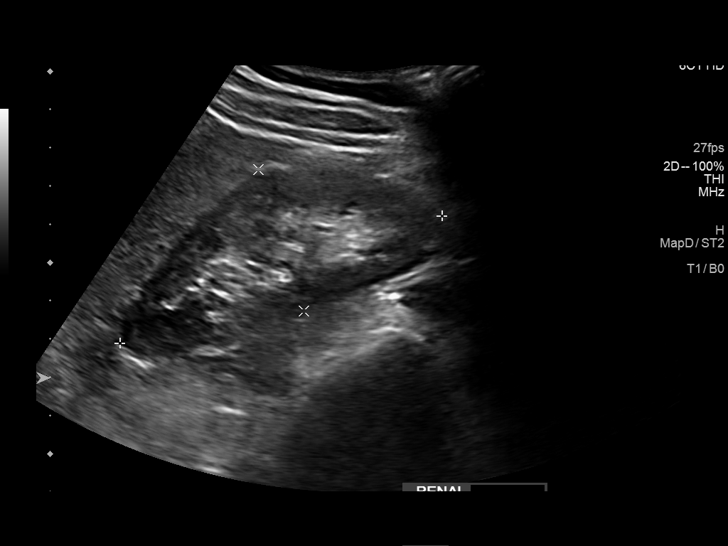
[im 9/49]
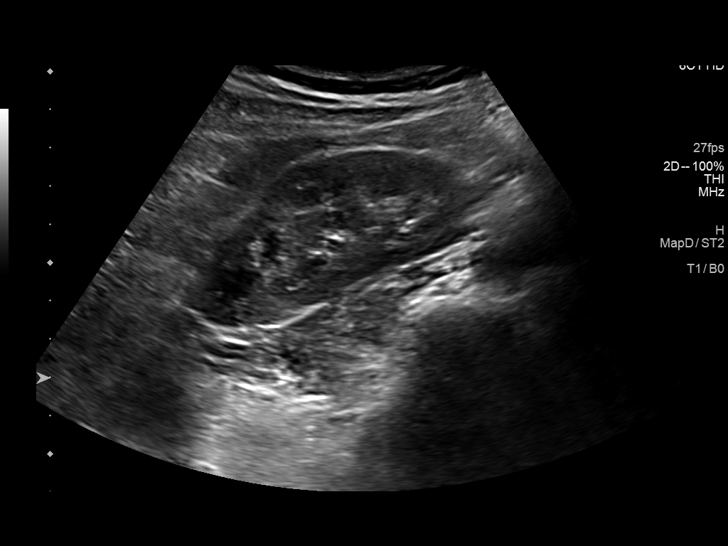
[im 13/49]
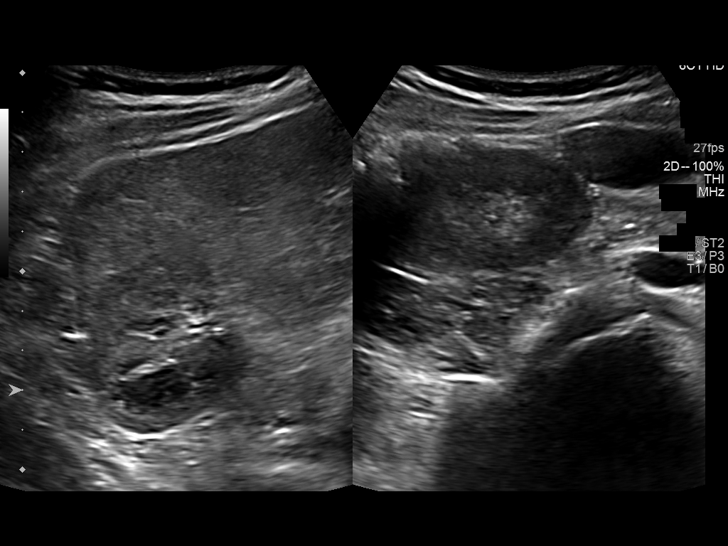
[im 17/49]
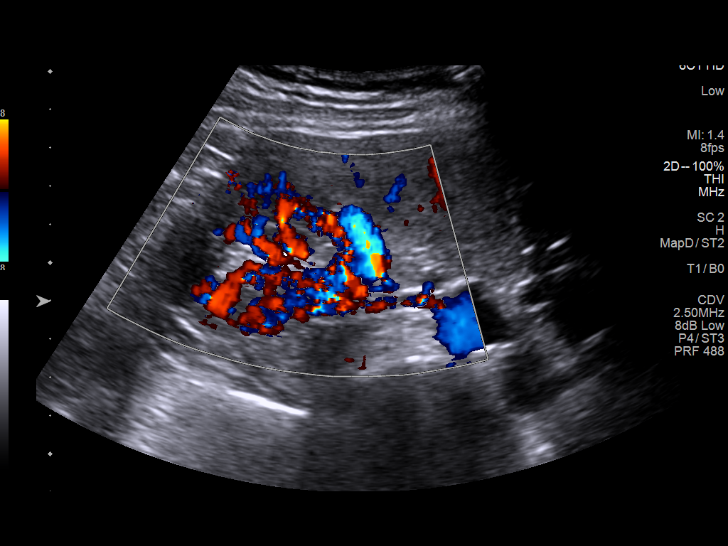
[im 19/49]
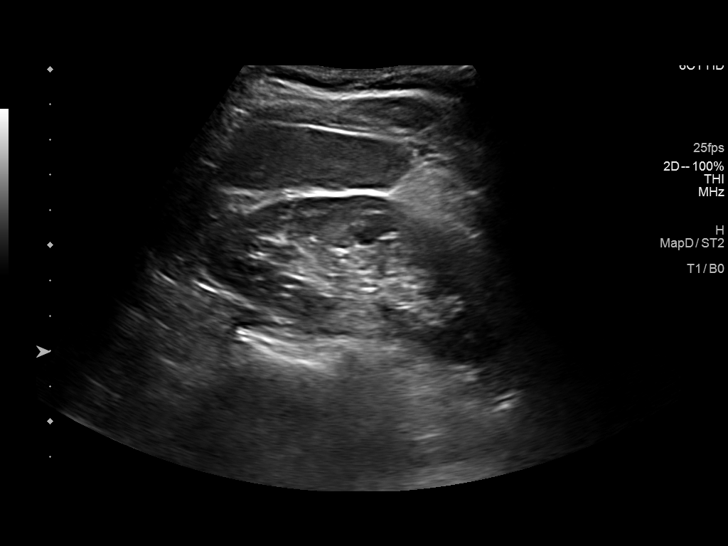
[im 23/49]
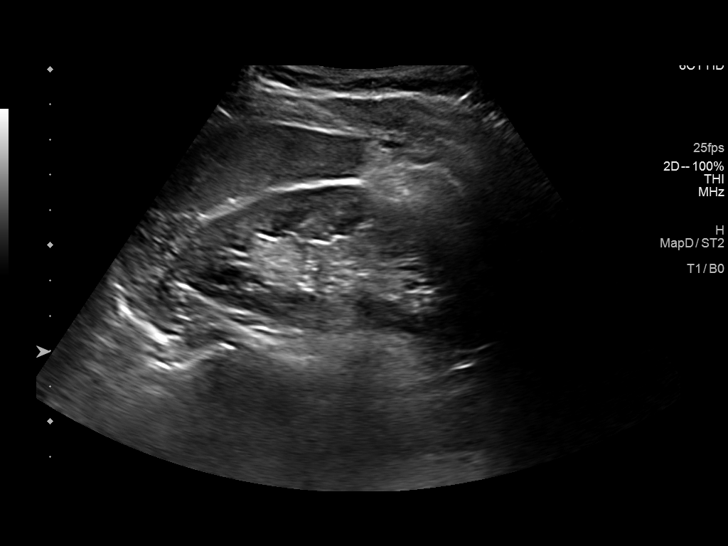
[im 27/49]
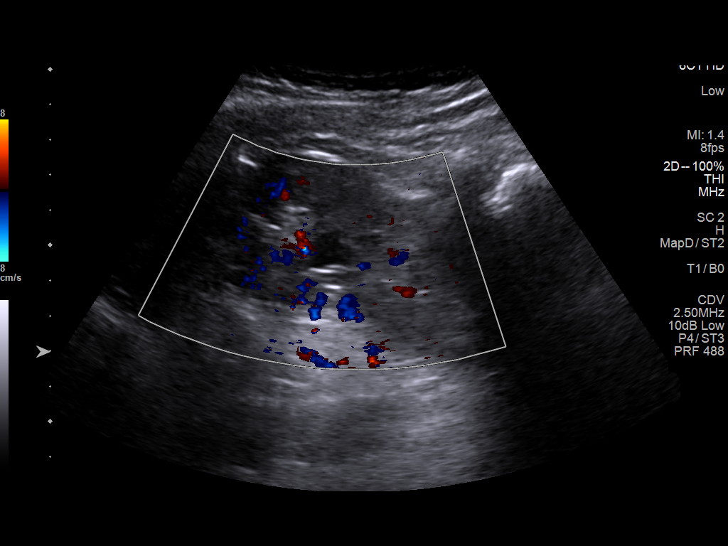
[im 31/49]
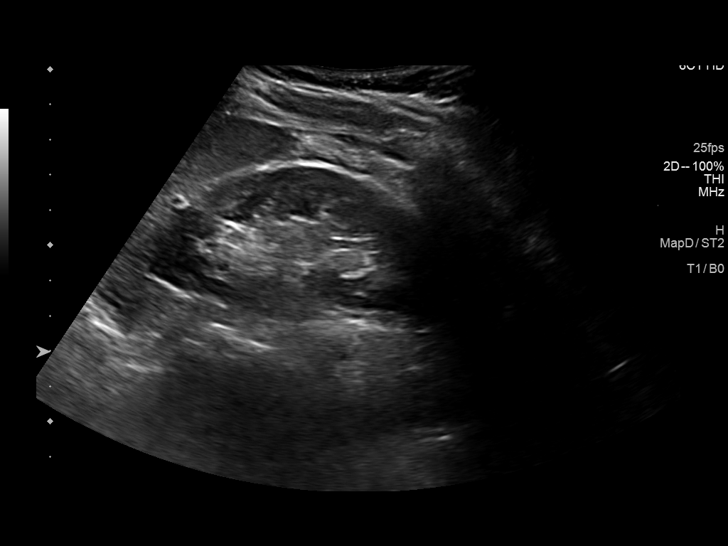
[im 33/49]
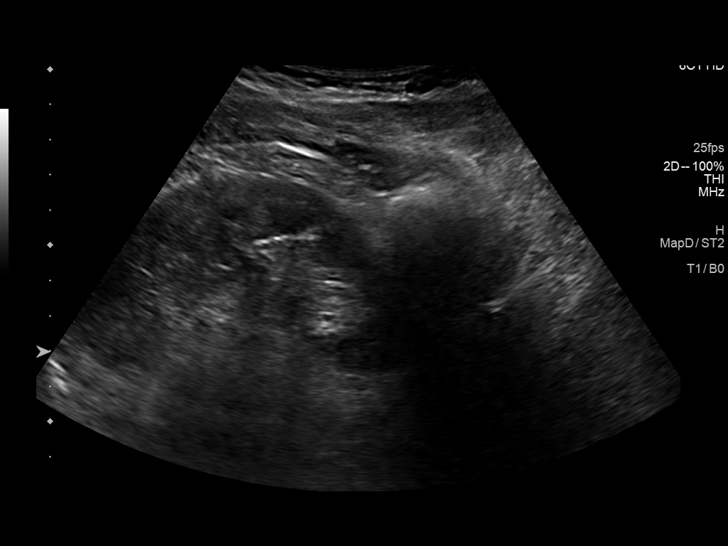
[im 37/49]
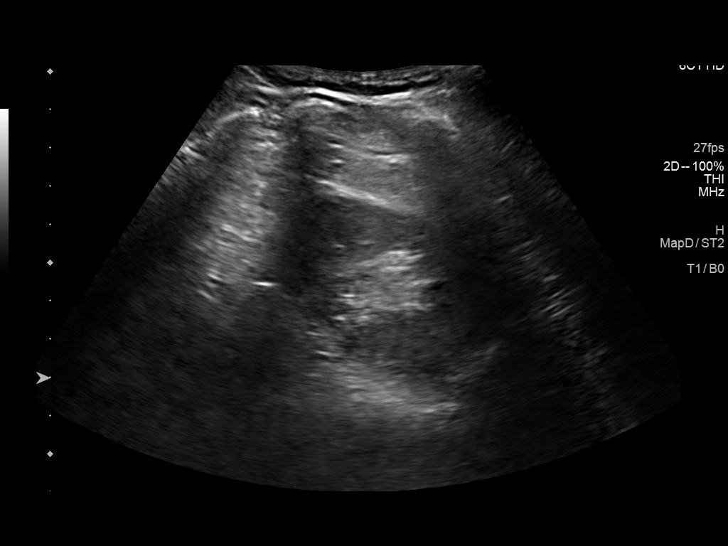
[im 41/49]
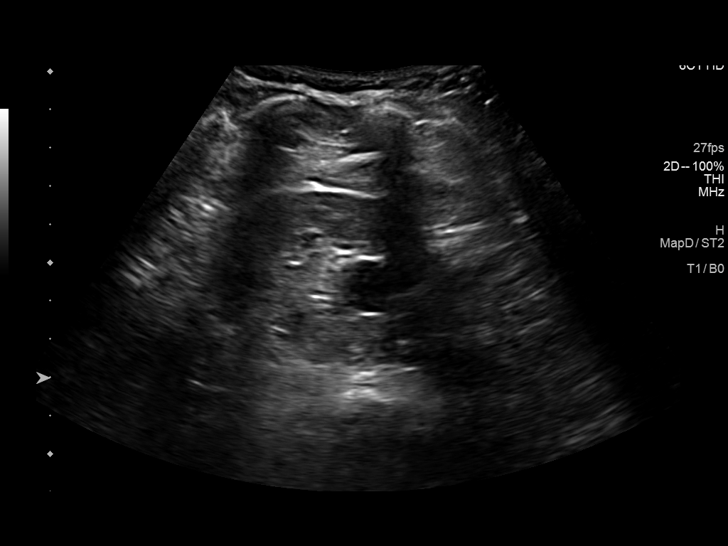
[im 45/49]
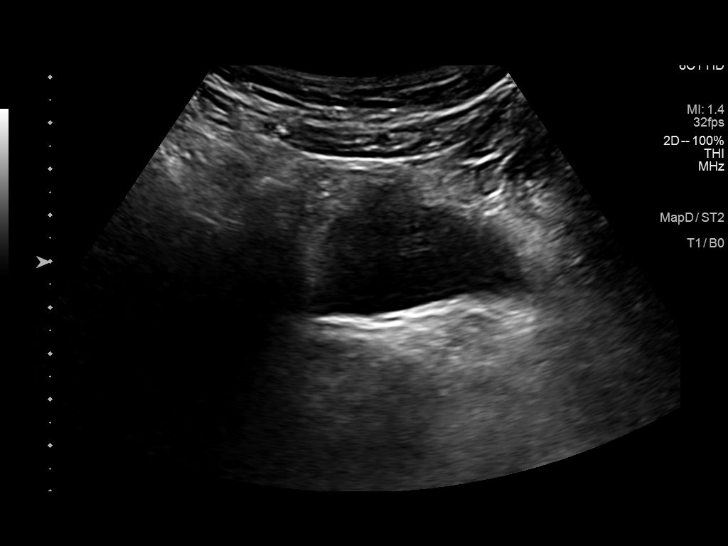
[im 49/49]
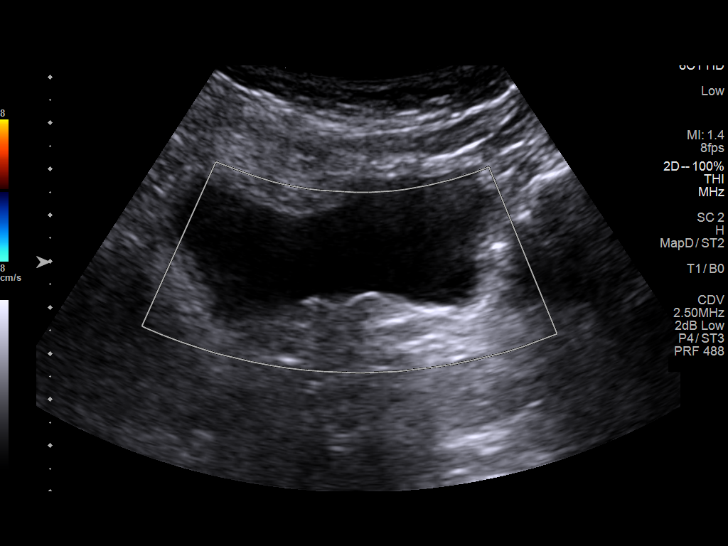

[14 of 25 positions shown; findings below may reference images not displayed]

FINDINGS: Right Kidney:

Renal measurements: 9.1 x 3.9 x 3.9 cm = volume: 72 mL .
Echogenicity within normal limits. No mass or hydronephrosis
visualized.

Left Kidney:

Renal measurements: 9.3 x 4.2 x 4.2 cm = volume: 85 mL. Echogenicity
within normal limits. No mass or hydronephrosis visualized. 1.6 cm
parapelvic cyst.

Bladder:

Appears normal for degree of bladder distention.

Other:

None.
IMPRESSION: Normal size kidneys without hydronephrosis.

1.6 cm left parapelvic renal cyst.

## 2021-02-16 DIAGNOSIS — M25572 Pain in left ankle and joints of left foot: Secondary | ICD-10-CM | POA: Diagnosis not present

## 2021-02-19 DIAGNOSIS — M7661 Achilles tendinitis, right leg: Secondary | ICD-10-CM | POA: Diagnosis not present

## 2021-02-19 DIAGNOSIS — M75121 Complete rotator cuff tear or rupture of right shoulder, not specified as traumatic: Secondary | ICD-10-CM | POA: Diagnosis not present

## 2021-02-23 ENCOUNTER — Ambulatory Visit (HOSPITAL_COMMUNITY)
Admission: RE | Admit: 2021-02-23 | Discharge: 2021-02-23 | Disposition: A | Discharge status: Home / Self Care | Payer: Medicare Other | Source: Ambulatory Visit | Attending: Sports Medicine | Admitting: Sports Medicine

## 2021-02-23 ENCOUNTER — Other Ambulatory Visit: Payer: Self-pay

## 2021-02-23 DIAGNOSIS — M810 Age-related osteoporosis without current pathological fracture: Secondary | ICD-10-CM | POA: Diagnosis not present

## 2021-02-23 MED ORDER — ROMOSOZUMAB-AQQG 105 MG/1.17ML ~~LOC~~ SOSY
210.0000 mg | PREFILLED_SYRINGE | Freq: Once | SUBCUTANEOUS | Status: DC
  Administered 2021-02-23: 210 mg via SUBCUTANEOUS
  Filled 2021-02-23: qty 2.4

## 2021-03-12 NOTE — Progress Notes (Signed)
75 y.o. [id number] Widowed White or Caucasian female here for breast & pelvic.     Husband passed in 05/2020, he was sick for many years Good support group [place] Has busy life. Bother her grown children and grandchildren live in [place]  No LMP recorded. Patient is postmenopausal.           Reports a little leaking of urine when wakes up and bladder is full. Interested in vaginal estrogen, started in last couple of months. Is just starting to date and is not sexually active but considering as a possibility in the future.  Sexually active: No.  The current method of family planning is post menopausal status.    Exercising: Yes.    pilates, eliptical, walking Smoker:  no  Health Maintenance: Pap:  12-22-11 neg History of abnormal Pap:  yes MMG:  11-03-2020 category c density birads 1:neg Colonoscopy:  08-15-2019 BMD:   09-05-2019 osteoporosis Gardasil:   n/a Covid-19: pfizer Hep C testing: neg 2017 Screening Labs: with PCP   reports that she has never smoked. She has never used smokeless tobacco. She reports current alcohol use. She reports that she does not use drugs.  Past Medical History:  Diagnosis Date  . Abnormal Pap smear of cervix   . CHEST PAIN 03/23/2010  . DEGENERATIVE DISC DISEASE 08/07/2008  . Dysphagia   . DYSPHAGIA UNSPECIFIED 03/23/2010  . ESOPHAGEAL STRICTURE 01/10/2011  . FIBROIDS, UTERUS 08/07/2008  . GERD 03/23/2010  . Herpes   . HIATAL HERNIA 01/10/2011  . HYPOTHYROIDISM 08/07/2008  . IRRITABLE BOWEL SYNDROME, HX OF 08/07/2008  . Macular degeneration 2019   R mild dry  . Mixed hyperlipidemia 08/07/2008  . Osteoporosis 08/2019   T score -2.5 stable from prior DEXA  . PERSONAL HX COLONIC POLYPS 03/23/2010  . Scarlet fever 08/07/2008  . Unspecified pruritic disorder 08/07/2008  . VAGINITIS, ATROPHIC 08/07/2008    Past Surgical History:  Procedure Laterality Date  . BREAST EXCISIONAL BIOPSY Bilateral    benign  . BREAST SURGERY     Breast biopsies right &  left- benign lesions  . CESAREAN SECTION    . COLONOSCOPY    . COLPOSCOPY    . GYNECOLOGIC CRYOSURGERY  by Dr. Dorathy Kinsman   low-grade dysplasia  . RHINOPLASTY    . TUBAL LIGATION      Current Outpatient Medications  Medication Sig Dispense Refill  . atorvastatin (LIPITOR) 10 MG tablet TAKE 1 TABLET(10 MG) BY MOUTH DAILY (Patient taking differently: every other .) 90 tablet 0  . Ca Carbonate-Mag Hydroxide (ROLAIDS) 550-110 MG CHEW Chew by mouth. Take 2 chews every     . Cholecalciferol (VITAMIN D-3) 125 MCG (5000 UT) TABS Take 1 tablet by mouth daily.    . diclofenac sodium (VOLTAREN) 1 % GEL Apply 1 application topically 4 (four) times daily. 100 g 0  . escitalopram (LEXAPRO) 5 MG tablet TAKE 1 TABLET(5 MG) BY MOUTH DAILY 90 tablet 3  . losartan (COZAAR) 25 MG tablet Take 1 tablet (25 mg total) by mouth daily. 90 tablet 3  . Multiple Vitamin (MULTIVITAMIN) tablet Take 1 tablet by mouth daily.    . ondansetron (ZOFRAN) 4 MG tablet Take 1 tablet (4 mg total) by mouth every 8 (eight) hours as needed for nausea or vomiting. 20 tablet 0  . pantoprazole (PROTONIX) 40 MG tablet Take 1 tablet (40 mg total) by mouth daily. 90 tablet 3  . Romosozumab-aqqg (EVENITY) 105 MG/1.17ML SOSY injection Inject 210 mg into the skin every  30 (thirty) days.    Marland Kitchen SYNTHROID 88 MCG tablet Take 1 tablet (88 mcg total) by mouth daily before breakfast. 90 tablet 3  . traZODone (DESYREL) 50 MG tablet Take 0.5-1 tablets (25-50 mg total) by mouth at bedtime. 90 tablet 3  . valACYclovir (VALTREX) 500 MG tablet Take 1 tablet (500 mg total) by mouth daily. 90 tablet 3  . ALPRAZolam (XANAX) 0.25 MG tablet Take 1 tablet (0.25 mg total) by mouth 2 (two) times daily as needed for anxiety. (Patient not taking: Reported on 03/15/2021) 30 tablet 1   No current facility-administered medications for this visit.    Family History  Problem Relation Age of Onset  . Pneumonia Mother   . Heart disease Mother 30       CAD  .  Coronary artery disease Father   . Heart disease Father 80       CAD/MI-fatal  . Breast cancer Paternal Aunt 11  . Ovarian cancer Paternal Aunt        dx in her 63s  . Prostate cancer Maternal Grandfather   . Breast cancer Sister 46       BRCA negative  . Hypertension Sister   . Diabetes Neg Hx   . Colon cancer Neg Hx   . Rectal cancer Neg Hx   . Stomach cancer Neg Hx   . Esophageal cancer Neg Hx     Review of Systems  Constitutional: Negative.   HENT: Negative.   Eyes: Negative.   Respiratory: Negative.   Cardiovascular: Negative.   Gastrointestinal: Negative.   Endocrine: Negative.   Genitourinary:       Vaginal dryness  Musculoskeletal: Negative.   Skin: Negative.   Allergic/Immunologic: Negative.   Neurological: Negative.   Hematological: Negative.   Psychiatric/Behavioral: Negative.     Exam:   BP 114/72   Pulse 70   Resp 16   Ht 5' 3.25" (1.607 m)   Wt 112 lb (50.8 kg)   BMI 19.68 kg/m   Height: 5' 3.25" (160.7 cm)  General appearance: alert, cooperative and appears stated age, no acute distress Head: Normocephalic, without obvious abnormality Neck: no adenopathy, thyroid normal to inspection and palpation Lungs: clear to auscultation bilaterally Breasts: No axillary or supraclavicular adenopathy, Normal to palpation without dominant masses Heart: regular rate and rhythm Abdomen: soft, non-tender; no masses,  no organomegaly Extremities: extremities normal, no edema Skin: No rashes or lesions Lymph nodes: Cervical, supraclavicular, and axillary nodes normal. No abnormal inguinal nodes palpated Neurologic: Grossly normal   Pelvic: External genitalia:  no lesions              Urethra:  normal appearing urethra with no masses, tenderness or lesions              Bartholins and Skenes: normal                 Vagina: normal appearing vagina, appropriate for age, normal appearing discharge, no lesions              Cervix: neg cervical motion tenderness, no  visible lesions             Bimanual Exam:   Uterus:  normal size, contour, position, consistency, mobility, non-tender              Adnexa: no mass, fullness, tenderness               Rectal: no palpable mass  Joy, CMA Chaperone was present for exam.  A:  Breast and pelvic screening  Recent onset incontinence in am (leaking when trying to get to bathroom)  P:   Pap :collected today  Mammogram: due 10/2021  Medications: Estrace vaginal (discussed starting therapy)  DEXA: will need in (08/2021)

## 2021-03-13 DIAGNOSIS — Z23 Encounter for immunization: Secondary | ICD-10-CM | POA: Diagnosis not present

## 2021-03-15 ENCOUNTER — Other Ambulatory Visit: Payer: Self-pay

## 2021-03-15 ENCOUNTER — Encounter: Payer: Self-pay | Admitting: Nurse Practitioner

## 2021-03-15 ENCOUNTER — Other Ambulatory Visit (HOSPITAL_COMMUNITY)
Admission: RE | Admit: 2021-03-15 | Discharge: 2021-03-15 | Disposition: A | Discharge status: Home / Self Care | Payer: Medicare Other | Source: Ambulatory Visit | Attending: Nurse Practitioner | Admitting: Nurse Practitioner

## 2021-03-15 ENCOUNTER — Ambulatory Visit (INDEPENDENT_AMBULATORY_CARE_PROVIDER_SITE_OTHER): Payer: Medicare Other | Admitting: Nurse Practitioner

## 2021-03-15 VITALS — BP 114/72 | HR 70 | Resp 16 | Ht 63.25 in | Wt 112.0 lb

## 2021-03-15 DIAGNOSIS — Z1272 Encounter for screening for malignant neoplasm of vagina: Secondary | ICD-10-CM

## 2021-03-15 DIAGNOSIS — B009 Herpesviral infection, unspecified: Secondary | ICD-10-CM

## 2021-03-15 DIAGNOSIS — N393 Stress incontinence (female) (male): Secondary | ICD-10-CM | POA: Diagnosis not present

## 2021-03-15 DIAGNOSIS — Z01419 Encounter for gynecological examination (general) (routine) without abnormal findings: Secondary | ICD-10-CM | POA: Diagnosis not present

## 2021-03-15 DIAGNOSIS — Z0000 Encounter for general adult medical examination without abnormal findings: Secondary | ICD-10-CM | POA: Insufficient documentation

## 2021-03-15 DIAGNOSIS — Z1151 Encounter for screening for human papillomavirus (HPV): Secondary | ICD-10-CM | POA: Insufficient documentation

## 2021-03-15 DIAGNOSIS — Z9189 Other specified personal risk factors, not elsewhere classified: Secondary | ICD-10-CM | POA: Diagnosis not present

## 2021-03-15 LAB — NO CULTURE INDICATED

## 2021-03-15 LAB — URINALYSIS, COMPLETE W/RFL CULTURE
Bacteria, UA: NONE SEEN /HPF
Bilirubin Urine: NEGATIVE
Glucose, UA: NEGATIVE
Hyaline Cast: NONE SEEN /LPF
Ketones, ur: NEGATIVE
Leukocyte Esterase: NEGATIVE
Nitrites, Initial: NEGATIVE
Protein, ur: NEGATIVE
RBC / HPF: NONE SEEN /HPF (ref 0–2)
Specific Gravity, Urine: 1.015 (ref 1.001–1.03)
WBC, UA: NONE SEEN /HPF (ref 0–5)
pH: 6.5 (ref 5.0–8.0)

## 2021-03-15 MED ORDER — ESTRADIOL 0.1 MG/GM VA CREA: TOPICAL_CREAM | VAGINAL | 3 refills | Status: DC

## 2021-03-15 NOTE — Patient Instructions (Addendum)
Lubricant: UberLube If needed: vaginal dilators Recommend start small with Estrace vaginal cream with 0.5 gm dose twice weekly    Health Maintenance After Age 75 After age 63, you are at a higher risk for certain long-term diseases and infections as well as injuries from falls. Falls are a major cause of broken bones and head injuries in people who are older than age 21. Getting regular preventive care can help to keep you healthy and well. Preventive care includes getting regular testing and making lifestyle changes as recommended by your health care provider. Talk with your health care provider about:  Which screenings and tests you should have. A screening is a test that checks for a disease when you have no symptoms.  A diet and exercise plan that is right for you. What should I know about screenings and tests to prevent falls? Screening and testing are the best ways to find a health problem early. Early diagnosis and treatment give you the best chance of managing medical conditions that are common after age 77. Certain conditions and lifestyle choices may make you more likely to have a fall. Your health care provider may recommend:  Regular vision checks. Poor vision and conditions such as cataracts can make you more likely to have a fall. If you wear glasses, make sure to get your prescription updated if your vision changes.  Medicine review. Work with your health care provider to regularly review all of the medicines you are taking, including over-the-counter medicines. Ask your health care provider about any side effects that may make you more likely to have a fall. Tell your health care provider if any medicines that you take make you feel dizzy or sleepy.  Osteoporosis screening. Osteoporosis is a condition that causes the bones to get weaker. This can make the bones weak and cause them to break more easily.  Blood pressure screening. Blood pressure changes and medicines to control  blood pressure can make you feel dizzy.  Strength and balance checks. Your health care provider may recommend certain tests to check your strength and balance while standing, walking, or changing positions.  Foot health exam. Foot pain and numbness, as well as not wearing proper footwear, can make you more likely to have a fall.  Depression screening. You may be more likely to have a fall if you have a fear of falling, feel emotionally low, or feel unable to do activities that you used to do.  Alcohol use screening. Using too much alcohol can affect your balance and may make you more likely to have a fall. What actions can I take to lower my risk of falls? General instructions  Talk with your health care provider about your risks for falling. Tell your health care provider if: ? You fall. Be sure to tell your health care provider about all falls, even ones that seem minor. ? You feel dizzy, sleepy, or off-balance.  Take over-the-counter and prescription medicines only as told by your health care provider. These include any supplements.  Eat a healthy diet and maintain a healthy weight. A healthy diet includes low-fat dairy products, low-fat (lean) meats, and fiber from whole grains, beans, and lots of fruits and vegetables. Home safety  Remove any tripping hazards, such as rugs, cords, and clutter.  Install safety equipment such as grab bars in bathrooms and safety rails on stairs.  Keep rooms and walkways well-lit. Activity  Follow a regular exercise program to stay fit. This will help you maintain your  balance. Ask your health care provider what types of exercise are appropriate for you.  If you need a cane or walker, use it as recommended by your health care provider.  Wear supportive shoes that have nonskid soles.   Lifestyle  Do not drink alcohol if your health care provider tells you not to drink.  If you drink alcohol, limit how much you have: ? 0-1 drink a day for  women. ? 0-2 drinks a day for men.  Be aware of how much alcohol is in your drink. In the [place] one drink equals one typical bottle of beer (12 oz), one-half glass of wine (5 oz), or one shot of hard liquor (1 oz).  Do not use any products that contain nicotine or tobacco, such as cigarettes and e-cigarettes. If you need help quitting, ask your health care provider. Summary  Having a healthy lifestyle and getting preventive care can help to protect your health and wellness after age 33.  Screening and testing are the best way to find a health problem early and help you avoid having a fall. Early diagnosis and treatment give you the best chance for managing medical conditions that are more common for people who are older than age 79.  Falls are a major cause of broken bones and head injuries in people who are older than age 96. Take precautions to prevent a fall at home.  Work with your health care provider to learn what changes you can make to improve your health and wellness and to prevent falls. This information is not intended to replace advice given to you by your health care provider. Make sure you discuss any questions you have with your health care provider. Document Revised: 03/28/2019 Document Reviewed: 10/18/2017 Elsevier Patient Education  2021 [employer].

## 2021-03-16 LAB — CYTOLOGY - PAP
Comment: NEGATIVE
Diagnosis: NEGATIVE
High risk HPV: NEGATIVE

## 2021-03-17 ENCOUNTER — Other Ambulatory Visit: Payer: Self-pay | Admitting: *Deleted

## 2021-03-17 DIAGNOSIS — M818 Other osteoporosis without current pathological fracture: Secondary | ICD-10-CM

## 2021-03-23 ENCOUNTER — Encounter (HOSPITAL_COMMUNITY)
Admission: RE | Admit: 2021-03-23 | Discharge: 2021-03-23 | Disposition: A | Discharge status: Home / Self Care | Payer: Medicare Other | Source: Ambulatory Visit | Attending: Sports Medicine | Admitting: Sports Medicine

## 2021-03-23 ENCOUNTER — Other Ambulatory Visit: Payer: Self-pay

## 2021-03-23 ENCOUNTER — Encounter (HOSPITAL_COMMUNITY): Payer: Medicare Other

## 2021-03-23 DIAGNOSIS — M810 Age-related osteoporosis without current pathological fracture: Secondary | ICD-10-CM | POA: Insufficient documentation

## 2021-03-23 MED ORDER — ROMOSOZUMAB-AQQG 105 MG/1.17ML ~~LOC~~ SOSY
210.0000 mg | PREFILLED_SYRINGE | Freq: Once | SUBCUTANEOUS | Status: AC
  Administered 2021-03-23: 210 mg via SUBCUTANEOUS
  Filled 2021-03-23: qty 2.4

## 2021-03-29 ENCOUNTER — Ambulatory Visit (INDEPENDENT_AMBULATORY_CARE_PROVIDER_SITE_OTHER): Payer: Medicare Other | Admitting: Podiatry

## 2021-03-29 ENCOUNTER — Ambulatory Visit (INDEPENDENT_AMBULATORY_CARE_PROVIDER_SITE_OTHER): Payer: Medicare Other

## 2021-03-29 ENCOUNTER — Other Ambulatory Visit: Payer: Self-pay

## 2021-03-29 ENCOUNTER — Encounter: Payer: Self-pay | Admitting: Podiatry

## 2021-03-29 DIAGNOSIS — M7662 Achilles tendinitis, left leg: Secondary | ICD-10-CM

## 2021-03-29 DIAGNOSIS — M21611 Bunion of right foot: Secondary | ICD-10-CM

## 2021-03-29 DIAGNOSIS — L600 Ingrowing nail: Secondary | ICD-10-CM | POA: Diagnosis not present

## 2021-03-29 DIAGNOSIS — H353132 Nonexudative age-related macular degeneration, bilateral, intermediate dry stage: Secondary | ICD-10-CM | POA: Diagnosis not present

## 2021-03-29 DIAGNOSIS — M21619 Bunion of unspecified foot: Secondary | ICD-10-CM

## 2021-03-29 DIAGNOSIS — M21612 Bunion of left foot: Secondary | ICD-10-CM | POA: Diagnosis not present

## 2021-03-30 ENCOUNTER — Telehealth: Payer: Self-pay | Admitting: Internal Medicine

## 2021-03-30 NOTE — Progress Notes (Signed)
Subjective:   Patient ID: Dawn Tucker, female   DOB: 75 y.o.   MRN: [id number]   HPI Patient presents with chronic ingrown toenails lateral border hallux bilateral bunions bilateral and a possible stress fracture right heel that has given her trouble and lasted for about 1 year.  Patient states the nails are chronic but she is getting ready to go out of town in several days wants something simple done today.  Patient does not smoke likes to be active   Review of Systems  All other systems reviewed and are negative.       Objective:  Physical Exam Vitals and nursing note reviewed.  Constitutional:      Appearance: She is well-developed.  Pulmonary:     Effort: Pulmonary effort is normal.  Musculoskeletal:        General: Normal range of motion.  Skin:    General: Skin is warm.  Neurological:     Mental Status: She is alert.     Neurovascular status intact muscle strength was found to be adequate range of motion adequate.  Patient is found to have incurvated hallux nails lateral bilateral that are moderately tender when pressed with no erythema edema or drainage noted and discomfort more on the posterior right heel at the insertion of the Achilles tendon and moderate structural bunion bilateral.  Patient has good digital perfusion well oriented x3     Assessment:  Ingrown toenail deformity hallux bilateral along with probable Achilles tendinitis right possible stress fracture and bunion deformity bilateral     Plan:  H&P x-rays reviewed and I do think that a eventual bunion correction may be necessary which I educated her on today and I carefully debrided out the nail corners and she wants to permanent procedures but will wait till she gets back from her trip and I do not recommend treatment for the Achilles unless it were to get worse again and I would consider injection treatment at that time.  Reappoint to recheck as needed  X-rays indicate that there is structural bunion  deformity bilateral no current indication of stress fracture right

## 2021-03-30 NOTE — Telephone Encounter (Signed)
LVM for pt to rtn my call to schedule AWV with [facility]. Please schedule AWV if pt calls the office  

## 2021-04-20 ENCOUNTER — Encounter (HOSPITAL_COMMUNITY)
Admission: RE | Admit: 2021-04-20 | Discharge: 2021-04-20 | Disposition: A | Discharge status: Home / Self Care | Payer: Medicare Other | Source: Ambulatory Visit | Attending: Sports Medicine | Admitting: Sports Medicine

## 2021-04-20 ENCOUNTER — Other Ambulatory Visit: Payer: Self-pay

## 2021-04-20 DIAGNOSIS — M810 Age-related osteoporosis without current pathological fracture: Secondary | ICD-10-CM | POA: Insufficient documentation

## 2021-04-20 MED ORDER — ROMOSOZUMAB-AQQG 105 MG/1.17ML ~~LOC~~ SOSY
210.0000 mg | PREFILLED_SYRINGE | Freq: Once | SUBCUTANEOUS | Status: DC
  Administered 2021-04-20: 210 mg via SUBCUTANEOUS
  Filled 2021-04-20: qty 2.4

## 2021-05-15 ENCOUNTER — Telehealth: Payer: Medicare Other | Admitting: Orthopedic Surgery

## 2021-05-15 DIAGNOSIS — U071 COVID-19: Secondary | ICD-10-CM

## 2021-05-15 MED ORDER — PAXLOVID 20 X 150 MG & 10 X 100MG PO TBPK: 3.0000 | ORAL_TABLET | Freq: Two times a day (BID) | ORAL | 0 refills | Status: DC

## 2021-05-15 MED ORDER — MOLNUPIRAVIR 200 MG PO CAPS: 800.0000 mg | ORAL_CAPSULE | Freq: Two times a day (BID) | ORAL | 0 refills | Status: AC

## 2021-05-15 MED ORDER — FLUTICASONE PROPIONATE 50 MCG/ACT NA SUSP: 2.0000 | Freq: Every day | NASAL | 0 refills | Status: DC

## 2021-05-15 NOTE — Progress Notes (Signed)
E-Visit for Positive Covid Test Result We are sorry you are not feeling well. We are here to help!  You have tested positive for COVID-19, meaning that you were infected with the novel coronavirus and could give the virus to others.  It is vitally important that you stay home so you do not spread it to others.      Please continue isolation at home, for at least 10 days since the start of your symptoms and until you have had 24 hours with no fever (without taking a fever reducer) and with improving of symptoms.  If you have no symptoms but tested positive (or all symptoms resolve after 5 days and you have no fever) you can leave your house but continue to wear a mask around others for an additional 5 days. If you have a fever,continue to stay home until you have had 24 hours of no fever. Most cases improve 5-10 days from onset but we have seen a small number of patients who have gotten worse after the 10 days.  Please be sure to watch for worsening symptoms and remain taking the proper precautions.   YOU QUALIFY FOR THE ORAL MEDICATIONS FOR COVID. WE CANNOT PRESCRIBE THEM THROUGH AN EVISIT. SINCE YOUR SYMPTOMS ARE VERY MILD YOU DON'T NECESSARILY NEED THEM BUT IF THEY GET WORSE AT ALL I WOULD RECOMMEND ONE. YOU CAN START THEM ANYTIME IN THE FIRST 5 DAYS SO PLEASE DO A VIDEO VISIT OR SEE YOUR PRIMARY CARE PROVIDER IF YOU WOULD LIKE A PRESCRIPTION.  Go to the nearest hospital ED for assessment if fever/cough/breathlessness are severe or illness seems like a threat to life.    The following symptoms may appear 2-14 days after exposure: . Fever . Cough . Shortness of breath or difficulty breathing . Chills . Repeated shaking with chills . Muscle pain . Headache . Sore throat . New loss of taste or smell . Fatigue . Congestion or runny nose . Nausea or vomiting . Diarrhea  You have been enrolled in [facility] for COVID-19. Daily you will receive a questionnaire within the [facility]  website. Our COVID-19 response team will be monitoring your responses daily.  You can use medication such as prescription for Fluticasone nasal spray 2 sprays in each nostril one time per day  You may also take acetaminophen (Tylenol) as needed for fever.  HOME CARE: . Only take medications as instructed by your medical team. . Drink plenty of fluids and get plenty of rest. . A steam or ultrasonic humidifier can help if you have congestion.   GET HELP RIGHT AWAY IF YOU HAVE EMERGENCY WARNING SIGNS.  Call 911 or proceed to your closest emergency facility if: . You develop worsening high fever. . Trouble breathing . Bluish lips or face . Persistent pain or pressure in the chest . New confusion . Inability to wake or stay awake . You cough up blood. . Your symptoms become more severe . Inability to hold down food or fluids  This list is not all possible symptoms. Contact your medical provider for any symptoms that are severe or concerning to you.    Your e-visit answers were reviewed by a board certified advanced clinical practitioner to complete your personal care plan.  Depending on the condition, your plan could have included both over the counter or prescription medications.  If there is a problem please reply once you have received a response from your provider.  Your safety is important to [place].  If  you have drug allergies check your prescription carefully.    You can use MyChart to ask questions about today's visit, request a non-urgent call back, or ask for a work or school excuse for 24 hours related to this e-Visit. If it has been greater than 24 hours you will need to follow up with your provider, or enter a new e-Visit to address those concerns. You will get an e-mail in the next two days asking about your experience.  I hope that your e-visit has been valuable and will speed your recovery. Thank you for using e-visits.      Greater than 5 minutes, yet less than 10  minutes of time have been spent researching, coordinating and implementing care for this patient today.

## 2021-05-15 NOTE — Progress Notes (Signed)
Ms. Dawn, Tucker are scheduled for a virtual visit with your provider today.    Just as we do with appointments in the office, we must obtain your consent to participate.  Your consent will be active for this visit and any virtual visit you may have with one of our providers in the next 365 days.    If you have a MyChart account, I can also send a copy of this consent to you electronically.  All virtual visits are billed to your insurance company just like a traditional visit in the office.  As this is a virtual visit, video technology does not allow for your provider to perform a traditional examination.  This may limit your provider's ability to fully assess your condition.  If your provider identifies any concerns that need to be evaluated in person or the need to arrange testing such as labs, EKG, etc, we will make arrangements to do so.    Although advances in technology are sophisticated, we cannot ensure that it will always work on either your end or our end.  If the connection with a video visit is poor, we may have to switch to a telephone visit.  With either a video or telephone visit, we are not always able to ensure that we have a secure connection.   I need to obtain your verbal consent now.   Are you willing to proceed with your visit today?   Dawn Tucker has provided verbal consent on 05/15/2021 for a virtual visit (video or telephone).   Dawn Abu, PA-C 05/15/2021  11:39 AM   Subjective: Pt called in for consideration of oral antiviral treatment for Covid.     Assessment/Plan: Covid -- Will prescribe molnupiravir 800mg  twice daily for 5 days.    Dawn Abu, PA-C 05/15/2021

## 2021-05-18 ENCOUNTER — Encounter (HOSPITAL_COMMUNITY): Payer: Medicare Other

## 2021-05-19 ENCOUNTER — Encounter (HOSPITAL_COMMUNITY): Payer: Medicare Other

## 2021-05-20 ENCOUNTER — Ambulatory Visit: Payer: Medicare Other | Admitting: Internal Medicine

## 2021-05-31 ENCOUNTER — Other Ambulatory Visit: Payer: Self-pay | Admitting: Internal Medicine

## 2021-06-01 ENCOUNTER — Other Ambulatory Visit: Payer: Self-pay

## 2021-06-01 ENCOUNTER — Ambulatory Visit (HOSPITAL_COMMUNITY)
Admission: RE | Admit: 2021-06-01 | Discharge: 2021-06-01 | Disposition: A | Discharge status: Home / Self Care | Payer: Medicare Other | Source: Ambulatory Visit | Attending: Sports Medicine | Admitting: Sports Medicine

## 2021-06-01 DIAGNOSIS — M810 Age-related osteoporosis without current pathological fracture: Secondary | ICD-10-CM | POA: Diagnosis not present

## 2021-06-01 MED ORDER — ROMOSOZUMAB-AQQG 105 MG/1.17ML ~~LOC~~ SOSY
210.0000 mg | PREFILLED_SYRINGE | Freq: Once | SUBCUTANEOUS | Status: DC
  Administered 2021-06-01: 11:00:00 210 mg via SUBCUTANEOUS
  Filled 2021-06-01: qty 2.4

## 2021-06-02 ENCOUNTER — Ambulatory Visit (INDEPENDENT_AMBULATORY_CARE_PROVIDER_SITE_OTHER): Payer: Medicare Other | Admitting: Internal Medicine

## 2021-06-02 ENCOUNTER — Encounter: Payer: Self-pay | Admitting: Internal Medicine

## 2021-06-02 VITALS — BP 108/70 | HR 62 | Temp 98.2°F | Ht 63.25 in | Wt 111.8 lb

## 2021-06-02 DIAGNOSIS — N1831 Chronic kidney disease, stage 3a: Secondary | ICD-10-CM | POA: Diagnosis not present

## 2021-06-02 DIAGNOSIS — I10 Essential (primary) hypertension: Secondary | ICD-10-CM | POA: Diagnosis not present

## 2021-06-02 DIAGNOSIS — F4323 Adjustment disorder with mixed anxiety and depressed mood: Secondary | ICD-10-CM | POA: Diagnosis not present

## 2021-06-02 DIAGNOSIS — F4321 Adjustment disorder with depressed mood: Secondary | ICD-10-CM | POA: Diagnosis not present

## 2021-06-02 DIAGNOSIS — F5101 Primary insomnia: Secondary | ICD-10-CM | POA: Diagnosis not present

## 2021-06-02 DIAGNOSIS — E039 Hypothyroidism, unspecified: Secondary | ICD-10-CM | POA: Diagnosis not present

## 2021-06-02 LAB — COMPREHENSIVE METABOLIC PANEL
ALT: 30 U/L (ref 0–35)
AST: 23 U/L (ref 0–37)
Albumin: 4.3 g/dL (ref 3.5–5.2)
Alkaline Phosphatase: 68 U/L (ref 39–117)
BUN: 23 mg/dL (ref 6–23)
CO2: 31 mEq/L (ref 19–32)
Calcium: 9.2 mg/dL (ref 8.4–10.5)
Chloride: 102 mEq/L (ref 96–112)
Creatinine, Ser: 1.11 mg/dL (ref 0.40–1.20)
GFR: 48.93 mL/min — ABNORMAL LOW (ref >60.00)
Glucose, Bld: 125 mg/dL — ABNORMAL HIGH (ref 70–99)
Potassium: 4.2 mEq/L (ref 3.5–5.1)
Sodium: 139 mEq/L (ref 135–145)
Total Bilirubin: 0.7 mg/dL (ref 0.2–1.2)
Total Protein: 7.3 g/dL (ref 6.0–8.3)

## 2021-06-02 LAB — TSH: TSH: 1.61 u[IU]/mL (ref 0.35–4.50)

## 2021-06-02 MED ORDER — TRAZODONE HCL 50 MG PO TABS: 25.0000 mg | ORAL_TABLET | Freq: Every day | ORAL | 3 refills | Status: DC

## 2021-06-02 MED ORDER — ATORVASTATIN CALCIUM 10 MG PO TABS: ORAL_TABLET | ORAL | 3 refills | Status: DC

## 2021-06-02 MED ORDER — SYNTHROID 88 MCG PO TABS: 88.0000 ug | ORAL_TABLET | Freq: Every day | ORAL | 3 refills | Status: DC

## 2021-06-02 NOTE — Assessment & Plan Note (Signed)
Coping OK 

## 2021-06-02 NOTE — Assessment & Plan Note (Signed)
Xanax prn  Potential benefits of a long term benzodiazepines  use as well as potential risks  and complications were explained to the patient and were aknowledged. Trazodone prn insomnia

## 2021-06-02 NOTE — Assessment & Plan Note (Signed)
Trazodone prn  Potential benefits of a long term benzodiazepines  use as well as potential risks  and complications were explained to the patient and were aknowledged

## 2021-06-02 NOTE — Progress Notes (Signed)
Subjective:  Patient ID: Dawn Tucker, female    DOB: [birth year]  Age: 75 y.o. MRN: [id number]  CC: Follow-up (6 month f/u)   HPI Dawn Tucker presents for a post-COVID visit F/u anxiety, dyslipidemia, hypothyroidism  Outpatient Medications Prior to Visit  Medication Sig Dispense Refill   ALPRAZolam (XANAX) 0.25 MG tablet Take 1 tablet (0.25 mg total) by mouth 2 (two) times daily as needed for anxiety. 30 tablet 1   atorvastatin (LIPITOR) 10 MG tablet TAKE 1 TABLET(10 MG) BY MOUTH DAILY (Patient taking differently: every other day.) 90 tablet 0   Ca Carbonate-Mag Hydroxide (ROLAIDS) 550-110 MG CHEW Chew by mouth. Take 2 chews every day     Cholecalciferol (VITAMIN D-3) 125 MCG (5000 UT) TABS Take 1 tablet by mouth daily.     escitalopram (LEXAPRO) 5 MG tablet TAKE 1 TABLET(5 MG) BY MOUTH DAILY 90 tablet 3   estradiol (ESTRACE) 0.1 MG/GM vaginal cream 0.5-1 gram vaginally twice weekly 42.5 g 3   fluticasone (FLONASE) 50 MCG/ACT nasal spray Place 2 sprays into both nostrils daily. 16 g 0   losartan (COZAAR) 25 MG tablet Take 1 tablet (25 mg total) by mouth daily. 90 tablet 3   Multiple Vitamin (MULTIVITAMIN) tablet Take 1 tablet by mouth daily.     pantoprazole (PROTONIX) 40 MG tablet Take 1 tablet (40 mg total) by mouth daily. 90 tablet 3   Romosozumab-aqqg (EVENITY) 105 MG/1.17ML SOSY injection Inject 210 mg into the skin every 30 (thirty) days.     SYNTHROID 88 MCG tablet Take 1 tablet (88 mcg total) by mouth daily before breakfast. 90 tablet 3   traZODone (DESYREL) 50 MG tablet Take 0.5-1 tablets (25-50 mg total) by mouth at bedtime. 90 tablet 3   valACYclovir (VALTREX) 500 MG tablet TAKE 1 TABLET(500 MG) BY MOUTH DAILY 90 tablet 1   diclofenac sodium (VOLTAREN) 1 % GEL Apply 1 application topically 4 (four) times daily. (Patient not taking: Reported on 06/02/2021) 100 g 0   Nirmatrelvir & Ritonavir (PAXLOVID) 20 x 150 MG & 10 x 100MG  TBPK Take 3 tablets by mouth in the  morning and at bedtime. As directed on a package. Drink fluids (Patient not taking: Reported on 06/02/2021) 30 tablet 0   ondansetron (ZOFRAN) 4 MG tablet Take 1 tablet (4 mg total) by mouth every 8 (eight) hours as needed for nausea or vomiting. (Patient not taking: Reported on 06/02/2021) 20 tablet 0   No facility-administered medications prior to visit.    ROS: Review of Systems  Constitutional:  Negative for activity change, appetite change, chills, fatigue and unexpected weight change.  HENT:  Negative for congestion, mouth sores and sinus pressure.   Eyes:  Negative for visual disturbance.  Respiratory:  Negative for cough and chest tightness.   Gastrointestinal:  Negative for abdominal pain and nausea.  Genitourinary:  Negative for difficulty urinating, frequency and vaginal pain.  Musculoskeletal:  Negative for back pain and gait problem.  Skin:  Negative for pallor and rash.  Neurological:  Negative for dizziness, tremors, weakness, numbness and headaches.  Psychiatric/Behavioral:  Negative for confusion and sleep disturbance.    Objective:  BP 108/70 (BP Location: Left Arm)   Pulse 62   Temp 98.2 F (36.8 C) (Oral)   Ht 5' 3.25" (1.607 m)   Wt 111 lb 12.8 oz (50.7 kg)   SpO2 98%   BMI 19.65 kg/m   BP Readings from Last 3 Encounters:  06/02/21 108/70  06/01/21 (!) 142/78  04/20/21 121/69    Wt Readings from Last 3 Encounters:  06/02/21 111 lb 12.8 oz (50.7 kg)  03/15/21 112 lb (50.8 kg)  11/18/20 113 lb (51.3 kg)    Physical Exam Constitutional:      General: She is not in acute distress.    Appearance: Normal appearance. She is well-developed.  HENT:     Head: Normocephalic.     Right Ear: External ear normal.     Left Ear: External ear normal.     Nose: Nose normal.  Eyes:     General:        Right eye: No discharge.        Left eye: No discharge.     Conjunctiva/sclera: Conjunctivae normal.     Pupils: Pupils are equal, round, and reactive to light.   Neck:     Thyroid: No thyromegaly.     Vascular: No JVD.     Trachea: No tracheal deviation.  Cardiovascular:     Rate and Rhythm: Normal rate and regular rhythm.     Heart sounds: Normal heart sounds.  Pulmonary:     Effort: No respiratory distress.     Breath sounds: No stridor. No wheezing.  Abdominal:     General: Bowel sounds are normal. There is no distension.     Palpations: Abdomen is soft. There is no mass.     Tenderness: There is no abdominal tenderness. There is no guarding or rebound.  Musculoskeletal:        General: No tenderness.     Cervical back: Normal range of motion and neck supple. No rigidity.  Lymphadenopathy:     Cervical: No cervical adenopathy.  Skin:    Findings: No erythema or rash.  Neurological:     Mental Status: She is oriented to person, place, and time.     Cranial Nerves: No cranial nerve deficit.     Motor: No abnormal muscle tone.     Coordination: Coordination normal.     Deep Tendon Reflexes: Reflexes normal.  Psychiatric:        Behavior: Behavior normal.        Thought Content: Thought content normal.        Judgment: Judgment normal.    Lab Results  Component Value Date   WBC 7.6 11/18/2020   HGB 12.8 11/18/2020   HCT 38.5 11/18/2020   PLT 249.0 11/18/2020   GLUCOSE 106 (H) 11/18/2020   CHOL 196 11/18/2020   TRIG 181.0 (H) 11/18/2020   HDL 58.60 11/18/2020   LDLCALC 101 (H) 11/18/2020   ALT 28 11/18/2020   AST 25 11/18/2020   NA 139 11/18/2020   K 4.0 11/18/2020   CL 102 11/18/2020   CREATININE 0.99 11/18/2020   BUN 17 11/18/2020   CO2 32 11/18/2020   TSH 0.51 11/18/2020   HGBA1C 5.9 10/02/2015    No results found.  Assessment & Plan:     Walker Kehr, MD

## 2021-06-02 NOTE — Assessment & Plan Note (Signed)
Cont w/good hydration 

## 2021-06-02 NOTE — Assessment & Plan Note (Signed)
On Losartan Get CMET

## 2021-06-29 ENCOUNTER — Ambulatory Visit (HOSPITAL_COMMUNITY)
Admission: RE | Admit: 2021-06-29 | Discharge: 2021-06-29 | Disposition: A | Discharge status: Home / Self Care | Payer: Medicare Other | Source: Ambulatory Visit | Attending: Sports Medicine | Admitting: Sports Medicine

## 2021-06-29 ENCOUNTER — Other Ambulatory Visit: Payer: Self-pay

## 2021-06-29 DIAGNOSIS — M810 Age-related osteoporosis without current pathological fracture: Secondary | ICD-10-CM | POA: Insufficient documentation

## 2021-06-29 MED ORDER — ROMOSOZUMAB-AQQG 105 MG/1.17ML ~~LOC~~ SOSY
210.0000 mg | PREFILLED_SYRINGE | Freq: Once | SUBCUTANEOUS | Status: AC
  Administered 2021-06-29: 210 mg via SUBCUTANEOUS
  Filled 2021-06-29: qty 2.4

## 2021-07-27 ENCOUNTER — Other Ambulatory Visit: Payer: Self-pay

## 2021-07-27 ENCOUNTER — Ambulatory Visit (HOSPITAL_COMMUNITY)
Admission: RE | Admit: 2021-07-27 | Discharge: 2021-07-27 | Disposition: A | Discharge status: Home / Self Care | Payer: Medicare Other | Source: Ambulatory Visit | Attending: Sports Medicine | Admitting: Sports Medicine

## 2021-07-27 DIAGNOSIS — M810 Age-related osteoporosis without current pathological fracture: Secondary | ICD-10-CM | POA: Insufficient documentation

## 2021-07-27 MED ORDER — ROMOSOZUMAB-AQQG 105 MG/1.17ML ~~LOC~~ SOSY
210.0000 mg | PREFILLED_SYRINGE | Freq: Once | SUBCUTANEOUS | Status: DC
  Administered 2021-07-27: 210 mg via SUBCUTANEOUS
  Filled 2021-07-27: qty 2.4

## 2021-08-24 ENCOUNTER — Encounter (HOSPITAL_COMMUNITY): Payer: Medicare Other

## 2021-08-25 ENCOUNTER — Other Ambulatory Visit: Payer: Self-pay

## 2021-08-25 ENCOUNTER — Ambulatory Visit (HOSPITAL_COMMUNITY)
Admission: RE | Admit: 2021-08-25 | Discharge: 2021-08-25 | Disposition: A | Discharge status: Home / Self Care | Payer: Medicare Other | Source: Ambulatory Visit | Attending: Sports Medicine | Admitting: Sports Medicine

## 2021-08-25 DIAGNOSIS — M810 Age-related osteoporosis without current pathological fracture: Secondary | ICD-10-CM | POA: Diagnosis not present

## 2021-08-25 MED ORDER — ROMOSOZUMAB-AQQG 105 MG/1.17ML ~~LOC~~ SOSY
210.0000 mg | PREFILLED_SYRINGE | Freq: Once | SUBCUTANEOUS | Status: AC
  Administered 2021-08-25: 210 mg via SUBCUTANEOUS
  Filled 2021-08-25: qty 2.4

## 2021-09-01 DIAGNOSIS — Z23 Encounter for immunization: Secondary | ICD-10-CM | POA: Diagnosis not present

## 2021-09-07 ENCOUNTER — Ambulatory Visit (INDEPENDENT_AMBULATORY_CARE_PROVIDER_SITE_OTHER): Payer: Medicare Other

## 2021-09-07 ENCOUNTER — Other Ambulatory Visit: Payer: Self-pay | Admitting: Nurse Practitioner

## 2021-09-07 ENCOUNTER — Other Ambulatory Visit: Payer: Self-pay

## 2021-09-07 DIAGNOSIS — Z780 Asymptomatic menopausal state: Secondary | ICD-10-CM

## 2021-09-07 DIAGNOSIS — M8589 Other specified disorders of bone density and structure, multiple sites: Secondary | ICD-10-CM

## 2021-09-07 DIAGNOSIS — M818 Other osteoporosis without current pathological fracture: Secondary | ICD-10-CM

## 2021-09-16 ENCOUNTER — Other Ambulatory Visit: Payer: Self-pay

## 2021-09-16 ENCOUNTER — Telehealth: Payer: Self-pay

## 2021-09-16 ENCOUNTER — Ambulatory Visit (INDEPENDENT_AMBULATORY_CARE_PROVIDER_SITE_OTHER): Payer: Medicare Other | Admitting: Nurse Practitioner

## 2021-09-16 ENCOUNTER — Encounter: Payer: Self-pay | Admitting: Nurse Practitioner

## 2021-09-16 VITALS — BP 122/78

## 2021-09-16 DIAGNOSIS — M8589 Other specified disorders of bone density and structure, multiple sites: Secondary | ICD-10-CM | POA: Diagnosis not present

## 2021-09-16 DIAGNOSIS — N8189 Other female genital prolapse: Secondary | ICD-10-CM

## 2021-09-16 DIAGNOSIS — N951 Menopausal and female climacteric states: Secondary | ICD-10-CM | POA: Diagnosis not present

## 2021-09-16 DIAGNOSIS — N3941 Urge incontinence: Secondary | ICD-10-CM

## 2021-09-16 DIAGNOSIS — R32 Unspecified urinary incontinence: Secondary | ICD-10-CM

## 2021-09-16 NOTE — Progress Notes (Signed)
   Acute Office Visit  Subjective:    Patient ID: Dawn Tucker, female    DOB: 1946/06/21, 75 y.o.   MRN: [id number]   HPI 75 y.o. presents today for vaginal dryness and urinary incontinence. She was started on vaginal estrogen in March 2022 and has noticed some improvement. She is currently using twice weekly. She has also been using vaginal dilators. She experiences urinary leakage in the mornings when first awakening or when unable to void for long periods.  Also wanted to discuss bone density report because it was stated that she had 2 doses of Evenity, but she has completed full 24-dose course. Seeing osteoporosis specialist Dr. Layne Benton.    Review of Systems  Constitutional: Negative.   Genitourinary:        Vaginal dryness Urinary incontinence      Objective:    Physical Exam Constitutional:      Appearance: Normal appearance.  GU: Declines  BP 122/78  Wt Readings from Last 3 Encounters:  06/02/21 111 lb 12.8 oz (50.7 kg)  03/15/21 112 lb (50.8 kg)  11/18/20 113 lb (51.3 kg)        Assessment & Plan:   Problem List Items Addressed This Visit   None Visit Diagnoses     Menopausal vaginal dryness    -  Primary   Urge incontinence       Osteopenia of multiple sites          Plan: Discussed increasing vaginal estrogen to 3 x weekly and applying coconut oil externally. She is not currently sexually active. Urge incontinence occurring due to full bladder. Recommend no fluids 2-3 hours prior to sleep or times when she cannot void for long periods, void just before bed, and void more often to avoid overfilling of bladder. She would also like referral for pelvic floor PT. All questions answered.      Tamela Gammon DNP, 2:24 PM 09/16/2021

## 2021-09-16 NOTE — Telephone Encounter (Signed)
I left message for patient that referral to PT for pelvic floor therapy has been placed and someone from that office will be calling her.

## 2021-09-16 NOTE — Telephone Encounter (Signed)
Pelvic floor PT referral Received: Today Dawn Gammon, NP  [facility] Triage Please send referral for pelvic floor PT due to incontinence and pelvic floor weakness

## 2021-09-21 DIAGNOSIS — L578 Other skin changes due to chronic exposure to nonionizing radiation: Secondary | ICD-10-CM | POA: Diagnosis not present

## 2021-09-21 DIAGNOSIS — L821 Other seborrheic keratosis: Secondary | ICD-10-CM | POA: Diagnosis not present

## 2021-09-21 DIAGNOSIS — D2272 Melanocytic nevi of left lower limb, including hip: Secondary | ICD-10-CM | POA: Diagnosis not present

## 2021-09-21 DIAGNOSIS — D2271 Melanocytic nevi of right lower limb, including hip: Secondary | ICD-10-CM | POA: Diagnosis not present

## 2021-09-28 DIAGNOSIS — M810 Age-related osteoporosis without current pathological fracture: Secondary | ICD-10-CM | POA: Diagnosis not present

## 2021-09-30 DIAGNOSIS — E559 Vitamin D deficiency, unspecified: Secondary | ICD-10-CM | POA: Diagnosis not present

## 2021-09-30 DIAGNOSIS — M810 Age-related osteoporosis without current pathological fracture: Secondary | ICD-10-CM | POA: Diagnosis not present

## 2021-10-03 ENCOUNTER — Other Ambulatory Visit: Payer: Self-pay

## 2021-10-03 ENCOUNTER — Ambulatory Visit (HOSPITAL_COMMUNITY)
Admission: EM | Admit: 2021-10-03 | Discharge: 2021-10-03 | Disposition: A | Discharge status: Home / Self Care | Payer: Medicare Other | Attending: Emergency Medicine | Admitting: Emergency Medicine

## 2021-10-03 ENCOUNTER — Encounter (HOSPITAL_COMMUNITY): Payer: Self-pay | Admitting: Emergency Medicine

## 2021-10-03 DIAGNOSIS — N390 Urinary tract infection, site not specified: Secondary | ICD-10-CM

## 2021-10-03 DIAGNOSIS — R300 Dysuria: Secondary | ICD-10-CM | POA: Diagnosis not present

## 2021-10-03 DIAGNOSIS — N898 Other specified noninflammatory disorders of vagina: Secondary | ICD-10-CM

## 2021-10-03 LAB — POCT URINALYSIS DIPSTICK, ED / UC
Bilirubin Urine: NEGATIVE
Glucose, UA: NEGATIVE mg/dL
Nitrite: NEGATIVE
Protein, ur: NEGATIVE mg/dL
Specific Gravity, Urine: 1.015 (ref 1.005–1.030)
Urobilinogen, UA: 1 mg/dL (ref 0.0–1.0)
pH: 6 (ref 5.0–8.0)

## 2021-10-03 MED ORDER — PHENAZOPYRIDINE HCL 100 MG PO TABS: 100.0000 mg | ORAL_TABLET | Freq: Three times a day (TID) | ORAL | 0 refills | Status: DC | PRN

## 2021-10-03 MED ORDER — NITROFURANTOIN MONOHYD MACRO 100 MG PO CAPS: 100.0000 mg | ORAL_CAPSULE | Freq: Two times a day (BID) | ORAL | 0 refills | Status: DC

## 2021-10-03 NOTE — Discharge Instructions (Addendum)
Urine culture sent.  We will call you with the results.   Push fluids and get plenty of rest.   Take antibiotic as directed and to completion Take pyridium as prescribed and as needed for symptomatic relief Follow up with PCP if symptoms persists Return here or go to ER if you have any new or worsening symptoms such as fever, worsening abdominal pain, nausea/vomiting, flank pain, etc... 

## 2021-10-03 NOTE — ED Provider Notes (Signed)
[place]   Chief Complaint  Patient presents with   Urinary Frequency   Vaginal Itching     SUBJECTIVE:  Dawn Tucker is a 75 y.o. female who presented to the urgent care for complaint of abdominal pressure, urinary frequency and vaginal irritation that started last night..  She reports she has started a dose of Prolia  Localizes the pain to the lower abdomen.  Pain is i intermittent and describes it as  achy.  Has not tried any OTC medication.  Symptoms are made worse with urination.  Admits to similar symptoms in the past.  Denies fever, chills, nausea, vomiting,  flank pain, abnormal vaginal discharge or bleeding, hematuria.    LMP: No LMP recorded. Patient is postmenopausal.  ROS: As in HPI.  All other pertinent ROS negative.     Past Medical History:  Diagnosis Date   Abnormal Pap smear of cervix    CHEST PAIN 03/23/2010   DEGENERATIVE DISC DISEASE 08/07/2008   Dysphagia    DYSPHAGIA UNSPECIFIED 03/23/2010   ESOPHAGEAL STRICTURE 01/10/2011   FIBROIDS, UTERUS 08/07/2008   GERD 03/23/2010   Herpes    HIATAL HERNIA 01/10/2011   HYPOTHYROIDISM 08/07/2008   IRRITABLE BOWEL SYNDROME, HX OF 08/07/2008   Macular degeneration 2019   R mild dry   Mixed hyperlipidemia 08/07/2008   Osteoporosis 08/2019   T score -2.5 stable from prior DEXA   PERSONAL HX COLONIC POLYPS 03/23/2010   Scarlet fever 08/07/2008   Unspecified pruritic disorder 08/07/2008   VAGINITIS, ATROPHIC 08/07/2008   Past Surgical History:  Procedure Laterality Date   BREAST EXCISIONAL BIOPSY Bilateral    benign   BREAST SURGERY     Breast biopsies right & left- benign lesions   CESAREAN SECTION     COLONOSCOPY     COLPOSCOPY     GYNECOLOGIC CRYOSURGERY  by Dr. Dorathy Kinsman   low-grade dysplasia   RHINOPLASTY     TUBAL LIGATION     Allergies  Allergen Reactions   Sulfonamide Derivatives Other (See Comments)    REACTION AS A CHILD   No current facility-administered medications on file prior to  encounter.   Current Outpatient Medications on File Prior to Encounter  Medication Sig Dispense Refill   ALPRAZolam (XANAX) 0.25 MG tablet Take 1 tablet (0.25 mg total) by mouth 2 (two) times daily as needed for anxiety. 30 tablet 1   atorvastatin (LIPITOR) 10 MG tablet TAKE 1 TABLET(10 MG) BY MOUTH DAILY 90 tablet 3   Ca Carbonate-Mag Hydroxide (ROLAIDS) 550-110 MG CHEW Chew by mouth. Take 2 chews every day     Cholecalciferol (VITAMIN D-3) 125 MCG (5000 UT) TABS Take 1 tablet by mouth daily.     escitalopram (LEXAPRO) 5 MG tablet TAKE 1 TABLET(5 MG) BY MOUTH DAILY 90 tablet 3   estradiol (ESTRACE) 0.1 MG/GM vaginal cream 0.5-1 gram vaginally twice weekly 42.5 g 3   fluticasone (FLONASE) 50 MCG/ACT nasal spray Place 2 sprays into both nostrils daily. 16 g 0   losartan (COZAAR) 25 MG tablet Take 1 tablet (25 mg total) by mouth daily. 90 tablet 3   Multiple Vitamin (MULTIVITAMIN) tablet Take 1 tablet by mouth daily.     pantoprazole (PROTONIX) 40 MG tablet Take 1 tablet (40 mg total) by mouth daily. 90 tablet 3   Romosozumab-aqqg (EVENITY) 105 MG/1.17ML SOSY injection Inject 210 mg into the skin every 30 (thirty) days. (Patient not taking: Reported on 09/16/2021)     SYNTHROID 88 MCG tablet Take  1 tablet (88 mcg total) by mouth daily before breakfast. 90 tablet 3   traZODone (DESYREL) 50 MG tablet Take 0.5-1 tablets (25-50 mg total) by mouth at bedtime. 90 tablet 3   valACYclovir (VALTREX) 500 MG tablet TAKE 1 TABLET(500 MG) BY MOUTH DAILY 90 tablet 1   Social History   Socioeconomic History   Marital status: Widowed    Spouse name: Not on file   Number of children: 2   Years of education: Not on file   Highest education level: Not on file  Occupational History    Employer: UNEMPLOYED  Tobacco Use   Smoking status: Never   Smokeless tobacco: Never  Vaping Use   Vaping Use: Never used  Substance and Sexual Activity   Alcohol use: Yes    Comment: occassional   Drug use: No   Sexual  activity: Not Currently    Partners: Male    Birth control/protection: Post-menopausal    Comment: INTERCOURSE AGE 35, LESS THAN 5 SEXUAL PARTNERS  Other Topics Concern   Not on file  Social History Narrative   HSG, [employer]. Married 27'. 1 son- '79, 1 daughter- '71; 2 grandchildren. Marriage is in good health.Very active in community affairs   Social Determinants of Health   Financial Resource Strain: Not on file  Food Insecurity: Not on file  Transportation Needs: Not on file  Physical Activity: Not on file  Stress: Not on file  Social Connections: Not on file  Intimate Partner Violence: Not on file   Family History  Problem Relation Age of Onset   Pneumonia Mother    Heart disease Mother 61       CAD   Coronary artery disease Father    Heart disease Father 27       CAD/MI-fatal   Breast cancer Paternal Aunt 25   Ovarian cancer Paternal Aunt        dx in her 30s   Prostate cancer Maternal Grandfather    Breast cancer Sister 24       BRCA negative   Hypertension Sister    Diabetes Neg Hx    Colon cancer Neg Hx    Rectal cancer Neg Hx    Stomach cancer Neg Hx    Esophageal cancer Neg Hx     OBJECTIVE:  Vitals:   10/03/21 1303  BP: 138/81  Pulse: 64  Resp: 16  Temp: 98.2 F (36.8 C)  TempSrc: Oral  SpO2: 98%   General appearance: AOx3 in no acute distress HEENT: NCAT.  Oropharynx clear.  Lungs: clear to auscultation bilaterally without adventitious breath sounds Heart: regular rate and rhythm.  Radial pulses 2+ symmetrical bilaterally Abdomen: soft; non-distended; no tenderness; bowel sounds present; no guarding or rebound tenderness Back: no CVA tenderness Extremities: no edema; symmetrical with no gross deformities Skin: warm and dry Neurologic: Ambulates from chair to exam table without difficulty Psychological: alert and cooperative; normal mood and affect  Labs Reviewed  POCT URINALYSIS DIPSTICK, ED / UC - Abnormal; Notable for the following  components:      Result Value   Ketones, ur TRACE (*)    Hgb urine dipstick LARGE (*)    Leukocytes,Ua LARGE (*)    All other components within normal limits  URINE CULTURE    ASSESSMENT & PLAN:  1. Dysuria   2. Vaginal irritation   3. Acute lower UTI (urinary tract infection)     Meds ordered this encounter  Medications   nitrofurantoin, macrocrystal-monohydrate, (MACROBID) 100  MG capsule    Sig: Take 1 capsule (100 mg total) by mouth 2 (two) times daily.    Dispense:  10 capsule    Refill:  0   phenazopyridine (PYRIDIUM) 100 MG tablet    Sig: Take 1 tablet (100 mg total) by mouth 3 (three) times daily as needed for pain.    Dispense:  10 tablet    Refill:  0    Discharge instructions  Urine culture sent.  We will call you with the results.   Push fluids and get plenty of rest.   Take antibiotic as directed and to completion Take pyridium as prescribed and as needed for symptomatic relief Follow up with PCP if symptoms persists Return here or go to ER if you have any new or worsening symptoms such as fever, worsening abdominal pain, nausea/vomiting, flank pain, etc...  Outlined signs and symptoms indicating need for more acute intervention. Patient verbalized understanding. After Visit Summary given.      Emerson Monte, Benton 10/03/21 1332

## 2021-10-03 NOTE — ED Triage Notes (Signed)
Notes that her first provia shot was Thursday.

## 2021-10-03 NOTE — ED Triage Notes (Signed)
PT reports abdominal pressure, urinary frequency, and vaginal irritation that started last night.

## 2021-10-04 LAB — URINE CULTURE: Culture: NO GROWTH

## 2021-10-21 NOTE — Telephone Encounter (Signed)
I called patient because she is still not scheduled with the PT office.  She said she has been meaning to contact us.  I did let her know referral has been placed and she can call them to schedule her appt if she would like. I offered to provide phone # to her however she was in the airport leaving for a trip. We agreed I would send her a My Chart message with this info and I did send that.

## 2021-11-09 ENCOUNTER — Other Ambulatory Visit: Payer: Self-pay | Admitting: Internal Medicine

## 2021-11-19 ENCOUNTER — Other Ambulatory Visit: Payer: Self-pay | Admitting: Internal Medicine

## 2021-11-19 ENCOUNTER — Ambulatory Visit
Admission: RE | Admit: 2021-11-19 | Discharge: 2021-11-19 | Disposition: A | Discharge status: Home / Self Care | Payer: Medicare Other | Source: Ambulatory Visit | Attending: Internal Medicine | Admitting: Internal Medicine

## 2021-11-19 DIAGNOSIS — Z1231 Encounter for screening mammogram for malignant neoplasm of breast: Secondary | ICD-10-CM | POA: Diagnosis not present

## 2021-11-26 DIAGNOSIS — D485 Neoplasm of uncertain behavior of skin: Secondary | ICD-10-CM | POA: Diagnosis not present

## 2021-11-26 DIAGNOSIS — L821 Other seborrheic keratosis: Secondary | ICD-10-CM | POA: Diagnosis not present

## 2021-12-02 ENCOUNTER — Encounter: Payer: Self-pay | Admitting: Internal Medicine

## 2021-12-02 ENCOUNTER — Other Ambulatory Visit: Payer: Self-pay

## 2021-12-02 ENCOUNTER — Ambulatory Visit (INDEPENDENT_AMBULATORY_CARE_PROVIDER_SITE_OTHER): Payer: Medicare Other | Admitting: Internal Medicine

## 2021-12-02 VITALS — BP 118/80 | HR 57 | Temp 98.2°F | Ht 63.25 in | Wt 110.0 lb

## 2021-12-02 DIAGNOSIS — F4323 Adjustment disorder with mixed anxiety and depressed mood: Secondary | ICD-10-CM | POA: Diagnosis not present

## 2021-12-02 DIAGNOSIS — Z7184 Encounter for health counseling related to travel: Secondary | ICD-10-CM | POA: Diagnosis not present

## 2021-12-02 DIAGNOSIS — N1831 Chronic kidney disease, stage 3a: Secondary | ICD-10-CM | POA: Diagnosis not present

## 2021-12-02 DIAGNOSIS — E039 Hypothyroidism, unspecified: Secondary | ICD-10-CM

## 2021-12-02 MED ORDER — ONDANSETRON HCL 4 MG PO TABS: 4.0000 mg | ORAL_TABLET | Freq: Three times a day (TID) | ORAL | 0 refills | Status: DC | PRN

## 2021-12-02 MED ORDER — SCOPOLAMINE 1 MG/3DAYS TD PT72: 1.0000 | MEDICATED_PATCH | TRANSDERMAL | 12 refills | Status: DC

## 2021-12-02 MED ORDER — LOSARTAN POTASSIUM 25 MG PO TABS: 25.0000 mg | ORAL_TABLET | Freq: Every day | ORAL | 3 refills | Status: DC

## 2021-12-02 MED ORDER — PROMETHAZINE HCL 25 MG PO TABS: 25.0000 mg | ORAL_TABLET | Freq: Three times a day (TID) | ORAL | 0 refills | Status: AC | PRN

## 2021-12-02 MED ORDER — CIPROFLOXACIN HCL 250 MG PO TABS: 250.0000 mg | ORAL_TABLET | Freq: Two times a day (BID) | ORAL | 0 refills | Status: AC

## 2021-12-02 MED ORDER — ESCITALOPRAM OXALATE 5 MG PO TABS: ORAL_TABLET | ORAL | 3 refills | Status: DC

## 2021-12-02 NOTE — Assessment & Plan Note (Signed)
Potential benefits of a long term benzodiazepines  use as well as potential risks  and complications were explained to the patient and were aknowledged. Trazodone prn

## 2021-12-02 NOTE — Progress Notes (Signed)
Subjective:  Patient ID: Dawn Tucker, female    DOB: 1946/07/09  Age: 75 y.o. MRN: [id number]  CC: Follow-up (6 month f/u- Pt states she Tucker be traveling Tucker need the travel rx's MD prescribe before) and Medication Refill (Tucker need refills on synthroid, atorvastin lexapro)    HPI RETHER RISON presents for a trip coming up - [place] F/u depression, anxiety, GERD  Outpatient Medications Prior to Visit  Medication Sig Dispense Refill   ALPRAZolam (XANAX) 0.25 MG tablet Take 1 tablet (0.25 mg total) by mouth 2 (two) times daily as needed for anxiety. 30 tablet 1   atorvastatin (LIPITOR) 10 MG tablet TAKE 1 TABLET(10 MG) BY MOUTH DAILY 90 tablet 3   Ca Carbonate-Mag Hydroxide (ROLAIDS) 550-110 MG CHEW Chew by mouth. Take 2 chews every day     Cholecalciferol (VITAMIN D-3) 125 MCG (5000 UT) TABS Take 1 tablet by mouth daily.     estradiol (ESTRACE) 0.1 MG/GM vaginal cream 0.5-1 gram vaginally twice weekly 42.5 g 3   Multiple Vitamin (MULTIVITAMIN) tablet Take 1 tablet by mouth daily.     pantoprazole (PROTONIX) 40 MG tablet TAKE 1 TABLET(40 MG) BY MOUTH DAILY 90 tablet 1   PROLIA 60 MG/ML SOSY injection Inject into the skin once. Inject into the skin once every six months     SYNTHROID 88 MCG tablet Take 1 tablet (88 mcg total) by mouth daily before breakfast. 90 tablet 3   traZODone (DESYREL) 50 MG tablet Take 0.5-1 tablets (25-50 mg total) by mouth at bedtime. 90 tablet 3   escitalopram (LEXAPRO) 5 MG tablet TAKE 1 TABLET(5 MG) BY MOUTH DAILY 90 tablet 3   losartan (COZAAR) 25 MG tablet Take 1 tablet (25 mg total) by mouth daily. 90 tablet 3   valACYclovir (VALTREX) 500 MG tablet TAKE 1 TABLET(500 MG) BY MOUTH DAILY 90 tablet 1   fluticasone (FLONASE) 50 MCG/ACT nasal spray Place 2 sprays into both nostrils daily. 16 g 0   nitrofurantoin, macrocrystal-monohydrate, (MACROBID) 100 MG capsule Take 1 capsule (100 mg total) by mouth 2 (two) times daily. 10 capsule 0    phenazopyridine (PYRIDIUM) 100 MG tablet Take 1 tablet (100 mg total) by mouth 3 (three) times daily as needed for pain. 10 tablet 0   Romosozumab-aqqg (EVENITY) 105 MG/1.17ML SOSY injection Inject 210 mg into the skin every 30 (thirty) days. (Patient not taking: Reported on 09/16/2021)     No facility-administered medications prior to visit.    ROS: Review of Systems  Constitutional:  Negative for activity change, appetite change, chills, fatigue and unexpected weight change.  HENT:  Negative for congestion, mouth sores and sinus pressure.   Eyes:  Negative for visual disturbance.  Respiratory:  Negative for cough and chest tightness.   Gastrointestinal:  Negative for abdominal pain and nausea.  Genitourinary:  Negative for difficulty urinating, frequency and vaginal pain.  Musculoskeletal:  Negative for back pain and gait problem.  Skin:  Negative for pallor and rash.  Neurological:  Negative for dizziness, tremors, weakness, numbness and headaches.  Psychiatric/Behavioral:  Negative for confusion and sleep disturbance.    Objective:  BP 118/80 (BP Location: Left Arm)    Pulse (!) 57    Temp 98.2 F (36.8 C) (Oral)    Ht 5' 3.25" (1.607 m)    Wt 110 lb (49.9 kg)    SpO2 98%    BMI 19.33 kg/m   BP Readings from Last 3 Encounters:  12/02/21 118/80  10/03/21 138/81  09/16/21 122/78    Wt Readings from Last 3 Encounters:  12/02/21 110 lb (49.9 kg)  06/02/21 111 lb 12.8 oz (50.7 kg)  03/15/21 112 lb (50.8 kg)    Physical Exam Constitutional:      General: She is not in acute distress.    Appearance: She is well-developed.  HENT:     Head: Normocephalic.     Right Ear: External ear normal.     Left Ear: External ear normal.     Nose: Nose normal.  Eyes:     General:        Right eye: No discharge.        Left eye: No discharge.     Conjunctiva/sclera: Conjunctivae normal.     Pupils: Pupils are equal, round, and reactive to light.  Neck:     Thyroid: No thyromegaly.      Vascular: No JVD.     Trachea: No tracheal deviation.  Cardiovascular:     Rate and Rhythm: Normal rate and regular rhythm.     Heart sounds: Normal heart sounds.  Pulmonary:     Effort: No respiratory distress.     Breath sounds: No stridor. No wheezing.  Abdominal:     General: Bowel sounds are normal. There is no distension.     Palpations: Abdomen is soft. There is no mass.     Tenderness: There is no abdominal tenderness. There is no guarding or rebound.  Musculoskeletal:        General: No tenderness.     Cervical back: Normal range of motion and neck supple. No rigidity.  Lymphadenopathy:     Cervical: No cervical adenopathy.  Skin:    Findings: No erythema or rash.  Neurological:     Cranial Nerves: No cranial nerve deficit.     Motor: No abnormal muscle tone.     Coordination: Coordination normal.     Deep Tendon Reflexes: Reflexes normal.  Psychiatric:        Behavior: Behavior normal.        Thought Content: Thought content normal.        Judgment: Judgment normal.    Lab Results  Component Value Date   WBC 7.6 11/18/2020   HGB 12.8 11/18/2020   HCT 38.5 11/18/2020   PLT 249.0 11/18/2020   GLUCOSE 90 12/03/2021   CHOL 193 12/03/2021   TRIG 111.0 12/03/2021   HDL 63.00 12/03/2021   LDLCALC 108 (H) 12/03/2021   ALT 38 (H) 12/03/2021   AST 28 12/03/2021   NA 139 12/03/2021   K 4.3 12/03/2021   CL 103 12/03/2021   CREATININE 1.06 12/03/2021   BUN 19 12/03/2021   CO2 31 12/03/2021   TSH 6.39 (H) 12/03/2021   HGBA1C 5.9 10/02/2015    MM 3D SCREEN BREAST BILATERAL  Result Date: 11/22/2021 CLINICAL DATA:  Screening. EXAM: DIGITAL SCREENING BILATERAL MAMMOGRAM WITH TOMOSYNTHESIS AND CAD TECHNIQUE: Bilateral screening digital craniocaudal and mediolateral oblique mammograms were obtained. Bilateral screening digital breast tomosynthesis was performed. The images were evaluated with computer-aided detection. COMPARISON:  Previous exam(s). ACR Breast Density  Category b: There are scattered areas of fibroglandular density. FINDINGS: There are no findings suspicious for malignancy. IMPRESSION: No mammographic evidence of malignancy. A result letter of this screening mammogram Tucker be mailed directly to the patient. RECOMMENDATION: Screening mammogram in one year. (Code:SM-B-01Y) BI-RADS CATEGORY  1: Negative. Electronically Signed   By: Lajean Manes M.D.   On: 11/22/2021 12:31    Assessment &  Plan:   Problem List Items Addressed This Visit     Adjustment disorder with mixed anxiety and depressed mood     Potential benefits of a long term benzodiazepines  use as well as potential risks  and complications were explained to the patient and were aknowledged. Trazodone prn      Relevant Orders   Comprehensive metabolic panel (Completed)   TSH (Completed)   Chronic renal impairment, stage 3a (HCC)   Hypothyroidism - Primary   Relevant Orders   TSH (Completed)   Lipid panel (Completed)   Travel advice encounter    Upcoming trip on a small boat.  Scopolamine patch prescription given.  Cipro if needed         Meds ordered this encounter  Medications   promethazine (PHENERGAN) 25 MG tablet    Sig: Take 1 tablet (25 mg total) by mouth every 8 (eight) hours as needed for nausea or vomiting.    Dispense:  20 tablet    Refill:  0   ondansetron (ZOFRAN) 4 MG tablet    Sig: Take 1 tablet (4 mg total) by mouth every 8 (eight) hours as needed for nausea or vomiting.    Dispense:  20 tablet    Refill:  0   scopolamine (TRANSDERM-SCOP) 1 MG/3DAYS    Sig: Place 1 patch (1.5 mg total) onto the skin every 3 (three) days.    Dispense:  10 patch    Refill:  12   DISCONTD: scopolamine (TRANSDERM-SCOP) 1 MG/3DAYS    Sig: Place 1 patch (1.5 mg total) onto the skin every 3 (three) days.    Dispense:  10 patch    Refill:  12   ciprofloxacin (CIPRO) 250 MG tablet    Sig: Take 1 tablet (250 mg total) by mouth 2 (two) times daily for 3 days.    Dispense:   20 tablet    Refill:  0   escitalopram (LEXAPRO) 5 MG tablet    Sig: TAKE 1 TABLET(5 MG) BY MOUTH DAILY    Dispense:  90 tablet    Refill:  3   losartan (COZAAR) 25 MG tablet    Sig: Take 1 tablet (25 mg total) by mouth daily.    Dispense:  90 tablet    Refill:  3      Follow-up: Return in about 6 months (around 06/02/2022) for Wellness Exam.  Walker Kehr, MD

## 2021-12-03 ENCOUNTER — Other Ambulatory Visit: Payer: Self-pay | Admitting: Internal Medicine

## 2021-12-03 LAB — LIPID PANEL
Cholesterol: 193 mg/dL (ref 0–200)
HDL: 63 mg/dL (ref >39.00)
LDL Cholesterol: 108 mg/dL — ABNORMAL HIGH (ref 0–99)
NonHDL: 130.19
Total CHOL/HDL Ratio: 3
Triglycerides: 111 mg/dL (ref 0.0–149.0)
VLDL: 22.2 mg/dL (ref 0.0–40.0)

## 2021-12-03 LAB — COMPREHENSIVE METABOLIC PANEL
ALT: 38 U/L — ABNORMAL HIGH (ref 0–35)
AST: 28 U/L (ref 0–37)
Albumin: 4.2 g/dL (ref 3.5–5.2)
Alkaline Phosphatase: 63 U/L (ref 39–117)
BUN: 19 mg/dL (ref 6–23)
CO2: 31 mEq/L (ref 19–32)
Calcium: 9 mg/dL (ref 8.4–10.5)
Chloride: 103 mEq/L (ref 96–112)
Creatinine, Ser: 1.06 mg/dL (ref 0.40–1.20)
GFR: 51.53 mL/min — ABNORMAL LOW (ref >60.00)
Glucose, Bld: 90 mg/dL (ref 70–99)
Potassium: 4.3 mEq/L (ref 3.5–5.1)
Sodium: 139 mEq/L (ref 135–145)
Total Bilirubin: 0.6 mg/dL (ref 0.2–1.2)
Total Protein: 7 g/dL (ref 6.0–8.3)

## 2021-12-03 LAB — TSH: TSH: 6.39 u[IU]/mL — ABNORMAL HIGH (ref 0.35–5.50)

## 2021-12-06 DIAGNOSIS — Z20822 Contact with and (suspected) exposure to covid-19: Secondary | ICD-10-CM | POA: Diagnosis not present

## 2021-12-20 DIAGNOSIS — Z7184 Encounter for health counseling related to travel: Secondary | ICD-10-CM | POA: Insufficient documentation

## 2021-12-20 NOTE — Assessment & Plan Note (Signed)
Upcoming trip on a small boat.  Scopolamine patch prescription given.  Cipro if needed

## 2022-01-11 DIAGNOSIS — H2511 Age-related nuclear cataract, right eye: Secondary | ICD-10-CM | POA: Diagnosis not present

## 2022-01-11 DIAGNOSIS — H2512 Age-related nuclear cataract, left eye: Secondary | ICD-10-CM | POA: Diagnosis not present

## 2022-02-07 ENCOUNTER — Other Ambulatory Visit (INDEPENDENT_AMBULATORY_CARE_PROVIDER_SITE_OTHER): Payer: Medicare Other

## 2022-02-07 DIAGNOSIS — E039 Hypothyroidism, unspecified: Secondary | ICD-10-CM

## 2022-02-07 LAB — TSH: TSH: 0.27 u[IU]/mL — ABNORMAL LOW (ref 0.35–5.50)

## 2022-02-07 LAB — T4, FREE: Free T4: 1.12 ng/dL (ref 0.60–1.60)

## 2022-02-23 DIAGNOSIS — H2511 Age-related nuclear cataract, right eye: Secondary | ICD-10-CM | POA: Diagnosis not present

## 2022-02-23 DIAGNOSIS — Z01818 Encounter for other preprocedural examination: Secondary | ICD-10-CM | POA: Diagnosis not present

## 2022-02-23 DIAGNOSIS — H524 Presbyopia: Secondary | ICD-10-CM | POA: Diagnosis not present

## 2022-03-08 ENCOUNTER — Other Ambulatory Visit: Payer: Self-pay | Admitting: Internal Medicine

## 2022-03-09 DIAGNOSIS — H2512 Age-related nuclear cataract, left eye: Secondary | ICD-10-CM | POA: Diagnosis not present

## 2022-03-09 DIAGNOSIS — H524 Presbyopia: Secondary | ICD-10-CM | POA: Diagnosis not present

## 2022-03-09 DIAGNOSIS — H2511 Age-related nuclear cataract, right eye: Secondary | ICD-10-CM | POA: Diagnosis not present

## 2022-04-14 DIAGNOSIS — M810 Age-related osteoporosis without current pathological fracture: Secondary | ICD-10-CM | POA: Diagnosis not present

## 2022-04-14 DIAGNOSIS — R5383 Other fatigue: Secondary | ICD-10-CM | POA: Diagnosis not present

## 2022-04-14 DIAGNOSIS — M25551 Pain in right hip: Secondary | ICD-10-CM | POA: Diagnosis not present

## 2022-04-14 DIAGNOSIS — E559 Vitamin D deficiency, unspecified: Secondary | ICD-10-CM | POA: Diagnosis not present

## 2022-05-18 ENCOUNTER — Ambulatory Visit: Payer: Medicare Other | Admitting: Podiatry

## 2022-05-26 ENCOUNTER — Ambulatory Visit (INDEPENDENT_AMBULATORY_CARE_PROVIDER_SITE_OTHER): Payer: Medicare Other | Admitting: Podiatry

## 2022-05-26 ENCOUNTER — Encounter: Payer: Self-pay | Admitting: Podiatry

## 2022-05-26 DIAGNOSIS — L600 Ingrowing nail: Secondary | ICD-10-CM

## 2022-05-26 DIAGNOSIS — M21619 Bunion of unspecified foot: Secondary | ICD-10-CM

## 2022-05-26 NOTE — Progress Notes (Signed)
Subjective:   Patient ID: Dawn Tucker, female   DOB: 76 y.o.   MRN: [id number]   HPI Patient presents with chronic ingrown toenails of the big toe of both feet and bunion deformity.  Wants to get the ingrown is interested to what the recovery will be and what can be done   ROS      Objective:  Physical Exam  Neurovascular status intact good digital perfusion noted range of motion adequate with incurvated chronic nature hallux nail medial borders bilateral also has moderate bunion deformity bilateral with redness around the first metatarsal head.  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  I educated her on recovery and how the procedure proceeds and we discussed it at great length and she wants to get it done and we will figure a time out that works best for her.  Today I went ahead and I debrided her nailbeds just to take pressure off and will be seen back as needed

## 2022-05-26 NOTE — Patient Instructions (Signed)

## 2022-05-28 ENCOUNTER — Other Ambulatory Visit: Payer: Self-pay | Admitting: Internal Medicine

## 2022-06-07 ENCOUNTER — Other Ambulatory Visit: Payer: Self-pay | Admitting: Internal Medicine

## 2022-06-09 ENCOUNTER — Encounter: Payer: Self-pay | Admitting: Internal Medicine

## 2022-06-09 ENCOUNTER — Ambulatory Visit (INDEPENDENT_AMBULATORY_CARE_PROVIDER_SITE_OTHER): Payer: Medicare Other | Admitting: Internal Medicine

## 2022-06-09 ENCOUNTER — Telehealth: Payer: Self-pay | Admitting: Internal Medicine

## 2022-06-09 VITALS — BP 118/82 | HR 64 | Temp 98.1°F | Ht 63.25 in | Wt 114.0 lb

## 2022-06-09 DIAGNOSIS — Z0000 Encounter for general adult medical examination without abnormal findings: Secondary | ICD-10-CM

## 2022-06-09 DIAGNOSIS — F4323 Adjustment disorder with mixed anxiety and depressed mood: Secondary | ICD-10-CM

## 2022-06-09 DIAGNOSIS — F5101 Primary insomnia: Secondary | ICD-10-CM | POA: Diagnosis not present

## 2022-06-09 DIAGNOSIS — E039 Hypothyroidism, unspecified: Secondary | ICD-10-CM | POA: Diagnosis not present

## 2022-06-09 NOTE — Assessment & Plan Note (Signed)
Better Xanax, Zolpidem prn d/c; not together  Potential benefits of a long term benzodiazepines  use as well as potential risks  and complications were explained to the patient and were aknowledged. Trazodone prn

## 2022-06-09 NOTE — Patient Instructions (Signed)
Blue-Emu cream -- use 2-3 times a day ? ?

## 2022-06-09 NOTE — Assessment & Plan Note (Signed)
Levothroid 88 mcg/d

## 2022-06-09 NOTE — Telephone Encounter (Signed)
PT visits today after her appointment and wanted to confirm with Dr.Plotnikov whether she need a 3 month follow up for a 6 month follow up?  I set her up for a 6 month (she stated that's how her follow up appointments are usually spaced) but will call her back if it needed to me 3 instead.

## 2022-06-09 NOTE — Assessment & Plan Note (Signed)
Trazodone prn  Potential benefits of a long term benzodiazepines  use as well as potential risks  and complications were explained to the patient and were aknowledged.

## 2022-06-09 NOTE — Progress Notes (Signed)
Subjective:  Patient ID: Dawn Tucker, female    DOB: Apr 25, 1946  Age: 76 y.o. MRN: [id number]  CC: No chief complaint on file.   HPI JI FAIRBURN presents for anxiety, HTN, hypothyroidism f/u  Outpatient Medications Prior to Visit  Medication Sig Dispense Refill   ALPRAZolam (XANAX) 0.25 MG tablet Take 1 tablet (0.25 mg total) by mouth 2 (two) times daily as needed for anxiety. 30 tablet 1   Ca Carbonate-Mag Hydroxide (ROLAIDS) 550-110 MG CHEW Chew by mouth. Take 2 chews every day     Cholecalciferol (VITAMIN D-3) 125 MCG (5000 UT) TABS Take 1 tablet by mouth daily.     escitalopram (LEXAPRO) 5 MG tablet TAKE 1 TABLET(5 MG) BY MOUTH DAILY 90 tablet 3   estradiol (ESTRACE) 0.1 MG/GM vaginal cream 0.5-1 gram vaginally twice weekly 42.5 g 3   losartan (COZAAR) 25 MG tablet Take 1 tablet (25 mg total) by mouth daily. 90 tablet 3   Multiple Vitamin (MULTIVITAMIN) tablet Take 1 tablet by mouth daily.     ondansetron (ZOFRAN) 4 MG tablet Take 1 tablet (4 mg total) by mouth every 8 (eight) hours as needed for nausea or vomiting. 20 tablet 0   pantoprazole (PROTONIX) 40 MG tablet TAKE 1 TABLET(40 MG) BY MOUTH DAILY 90 tablet 1   PROLIA 60 MG/ML SOSY injection Inject into the skin once. Inject into the skin once every six months     promethazine (PHENERGAN) 25 MG tablet Take 1 tablet (25 mg total) by mouth every 8 (eight) hours as needed for nausea or vomiting. 20 tablet 0   scopolamine (TRANSDERM-SCOP) 1 MG/3DAYS Place 1 patch (1.5 mg total) onto the skin every 3 (three) days. 10 patch 12   SYNTHROID 88 MCG tablet TAKE 1 TABLET(88 MCG) BY MOUTH DAILY BEFORE BREAKFAST 90 tablet 1   traZODone (DESYREL) 50 MG tablet Take 0.5-1 tablets (25-50 mg total) by mouth at bedtime. 90 tablet 3   atorvastatin (LIPITOR) 10 MG tablet TAKE 1 TABLET(10 MG) BY MOUTH DAILY 90 tablet 3   valACYclovir (VALTREX) 500 MG tablet TAKE 1 TABLET(500 MG) BY MOUTH DAILY 90 tablet 1   No facility-administered  medications prior to visit.    ROS: Review of Systems  Constitutional:  Negative for activity change, appetite change, chills, fatigue and unexpected weight change.  HENT:  Negative for congestion, mouth sores and sinus pressure.   Eyes:  Negative for visual disturbance.  Respiratory:  Negative for cough and chest tightness.   Gastrointestinal:  Negative for abdominal pain and nausea.  Genitourinary:  Negative for difficulty urinating, frequency and vaginal pain.  Musculoskeletal:  Negative for back pain and gait problem.  Skin:  Negative for pallor and rash.  Neurological:  Negative for dizziness, tremors, weakness, numbness and headaches.  Psychiatric/Behavioral:  Negative for confusion and sleep disturbance.     Objective:  BP 118/82 (BP Location: Left Arm, Patient Position: Sitting, Cuff Size: Normal)   Pulse 64   Temp 98.1 F (36.7 C) (Oral)   Ht 5' 3.25" (1.607 m)   Wt 114 lb (51.7 kg)   SpO2 98%   BMI 20.03 kg/m   BP Readings from Last 3 Encounters:  06/09/22 118/82  12/02/21 118/80  10/03/21 138/81    Wt Readings from Last 3 Encounters:  06/09/22 114 lb (51.7 kg)  12/02/21 110 lb (49.9 kg)  06/02/21 111 lb 12.8 oz (50.7 kg)    Physical Exam Constitutional:      General: She is not  in acute distress.    Appearance: She is well-developed.  HENT:     Head: Normocephalic.     Right Ear: External ear normal.     Left Ear: External ear normal.     Nose: Nose normal.  Eyes:     General:        Right eye: No discharge.        Left eye: No discharge.     Conjunctiva/sclera: Conjunctivae normal.     Pupils: Pupils are equal, round, and reactive to light.  Neck:     Thyroid: No thyromegaly.     Vascular: No JVD.     Trachea: No tracheal deviation.  Cardiovascular:     Rate and Rhythm: Normal rate and regular rhythm.     Heart sounds: Normal heart sounds.  Pulmonary:     Effort: No respiratory distress.     Breath sounds: No stridor. No wheezing.   Abdominal:     General: Bowel sounds are normal. There is no distension.     Palpations: Abdomen is soft. There is no mass.     Tenderness: There is no abdominal tenderness. There is no guarding or rebound.  Musculoskeletal:        General: No tenderness.     Cervical back: Normal range of motion and neck supple. No rigidity.  Lymphadenopathy:     Cervical: No cervical adenopathy.  Skin:    Findings: No erythema or rash.  Neurological:     Cranial Nerves: No cranial nerve deficit.     Motor: No abnormal muscle tone.     Coordination: Coordination normal.     Deep Tendon Reflexes: Reflexes normal.  Psychiatric:        Behavior: Behavior normal.        Thought Content: Thought content normal.        Judgment: Judgment normal.     Lab Results  Component Value Date   WBC 7.6 11/18/2020   HGB 12.8 11/18/2020   HCT 38.5 11/18/2020   PLT 249.0 11/18/2020   GLUCOSE 90 12/03/2021   CHOL 193 12/03/2021   TRIG 111.0 12/03/2021   HDL 63.00 12/03/2021   LDLCALC 108 (H) 12/03/2021   ALT 38 (H) 12/03/2021   AST 28 12/03/2021   NA 139 12/03/2021   K 4.3 12/03/2021   CL 103 12/03/2021   CREATININE 1.06 12/03/2021   BUN 19 12/03/2021   CO2 31 12/03/2021   TSH 0.27 (L) 02/07/2022   HGBA1C 5.9 10/02/2015    MM 3D SCREEN BREAST BILATERAL  Result Date: 11/22/2021 CLINICAL DATA:  Screening. EXAM: DIGITAL SCREENING BILATERAL MAMMOGRAM WITH TOMOSYNTHESIS AND CAD TECHNIQUE: Bilateral screening digital craniocaudal and mediolateral oblique mammograms were obtained. Bilateral screening digital breast tomosynthesis was performed. The images were evaluated with computer-aided detection. COMPARISON:  Previous exam(s). ACR Breast Density Category b: There are scattered areas of fibroglandular density. FINDINGS: There are no findings suspicious for malignancy. IMPRESSION: No mammographic evidence of malignancy. A result letter of this screening mammogram will be mailed directly to the patient.  RECOMMENDATION: Screening mammogram in one year. (Code:SM-B-01Y) BI-RADS CATEGORY  1: Negative. Electronically Signed   By: Lajean Manes M.D.   On: 11/22/2021 12:31    Assessment & Plan:   Problem List Items Addressed This Visit     Adjustment disorder with mixed anxiety and depressed mood    Better Xanax, Zolpidem prn d/c; not together  Potential benefits of a long term benzodiazepines  use as well as potential  risks  and complications were explained to the patient and were aknowledged. Trazodone prn      Hypothyroidism    Levothroid 88 mcg/d      Relevant Orders   TSH   Urinalysis   CBC with Differential/Platelet   Lipid panel   Comprehensive metabolic panel   T4, free   TSH   Insomnia    Trazodone prn  Potential benefits of a long term benzodiazepines  use as well as potential risks  and complications were explained to the patient and were aknowledged.      Well adult exam - Primary   Relevant Orders   TSH   Urinalysis   CBC with Differential/Platelet   Lipid panel   Comprehensive metabolic panel   T4, free   TSH      No orders of the defined types were placed in this encounter.     Follow-up: Return in about 3 months (around 09/09/2022) for a follow-up visit.  Walker Kehr, MD

## 2022-06-15 ENCOUNTER — Other Ambulatory Visit: Payer: Self-pay | Admitting: Internal Medicine

## 2022-07-04 ENCOUNTER — Ambulatory Visit (INDEPENDENT_AMBULATORY_CARE_PROVIDER_SITE_OTHER): Payer: Medicare Other

## 2022-07-04 VITALS — Ht 63.0 in | Wt 112.0 lb

## 2022-07-04 DIAGNOSIS — Z0000 Encounter for general adult medical examination without abnormal findings: Secondary | ICD-10-CM

## 2022-07-04 NOTE — Patient Instructions (Signed)
Ms. Dawn Tucker , Thank you for taking time to come for your Medicare Wellness Visit. I appreciate your ongoing commitment to your health goals. Please review the following plan we discussed and let me know if I can assist you in the future.   Screening recommendations/referrals: Colonoscopy: not required Mammogram: completed 11/22/2021, due 11/23/2022 Bone Density: completed 09/07/2021 Recommended yearly ophthalmology/optometry visit for glaucoma screening and checkup Recommended yearly dental visit for hygiene and checkup  Vaccinations: Influenza vaccine: due 07/19/2022 Pneumococcal vaccine: completed 09/16/2013 Tdap vaccine: completed 12/03/2021 Shingles vaccine: completed    Covid-19: 09/01/2021, 03/13/2021, 08/10/2020, 01/18/2020, 12/29/2019  Advanced directives: Please bring a copy of your POA (Power of Attorney) and/or Living Will to your next appointment.   Conditions/risks identified: none  Next appointment: Follow up in one year for your annual wellness visit    Preventive Care 65 Years and Older, Female Preventive care refers to lifestyle choices and visits with your health care provider that can promote health and wellness. What does preventive care include? A yearly physical exam. This is also called an annual well check. Dental exams once or twice a year. Routine eye exams. Ask your health care provider how often you should have your eyes checked. Personal lifestyle choices, including: Daily care of your teeth and gums. Regular physical activity. Eating a healthy diet. Avoiding tobacco and drug use. Limiting alcohol use. Practicing safe sex. Taking low-dose aspirin every day. Taking vitamin and mineral supplements as recommended by your health care provider. What happens during an annual well check? The services and screenings done by your health care provider during your annual well check will depend on your age, overall health, lifestyle risk factors, and family history of  disease. Counseling  Your health care provider may ask you questions about your: Alcohol use. Tobacco use. Drug use. Emotional well-being. Home and relationship well-being. Sexual activity. Eating habits. History of falls. Memory and ability to understand (cognition). Work and work Statistician. Reproductive health. Screening  You may have the following tests or measurements: Height, weight, and BMI. Blood pressure. Lipid and cholesterol levels. These may be checked every 5 years, or more frequently if you are over 21 years old. Skin check. Lung cancer screening. You may have this screening every year starting at age 44 if you have a 30-pack-year history of smoking and currently smoke or have quit within the past 15 years. Fecal occult blood test (FOBT) of the stool. You may have this test every year starting at age 9. Flexible sigmoidoscopy or colonoscopy. You may have a sigmoidoscopy every 5 years or a colonoscopy every 10 years starting at age 81. Hepatitis C blood test. Hepatitis B blood test. Sexually transmitted disease (STD) testing. Diabetes screening. This is done by checking your blood sugar (glucose) after you have not eaten for a while (fasting). You may have this done every 1-3 years. Bone density scan. This is done to screen for osteoporosis. You may have this done starting at age 73. Mammogram. This may be done every 1-2 years. Talk to your health care provider about how often you should have regular mammograms. Talk with your health care provider about your test results, treatment options, and if necessary, the need for more tests. Vaccines  Your health care provider may recommend certain vaccines, such as: Influenza vaccine. This is recommended every year. Tetanus, diphtheria, and acellular pertussis (Tdap, Td) vaccine. You may need a Td booster every 10 years. Zoster vaccine. You may need this after age 75. Pneumococcal 13-valent conjugate (PCV13)  vaccine. One  dose is recommended after age 34. Pneumococcal polysaccharide (PPSV23) vaccine. One dose is recommended after age 43. Talk to your health care provider about which screenings and vaccines you need and how often you need them. This information is not intended to replace advice given to you by your health care provider. Make sure you discuss any questions you have with your health care provider. Document Released: 01/01/2016 Document Revised: 08/24/2016 Document Reviewed: 10/06/2015 Elsevier Interactive Patient Education  2017 [place] Prevention in the Home Falls can cause injuries. They can happen to people of all ages. There are many things you can do to make your home safe and to help prevent falls. What can I do on the outside of my home? Regularly fix the edges of walkways and driveways and fix any cracks. Remove anything that might make you trip as you walk through a door, such as a raised step or threshold. Trim any bushes or trees on the path to your home. Use bright outdoor lighting. Clear any walking paths of anything that might make someone trip, such as rocks or tools. Regularly check to see if handrails are loose or broken. Make sure that both sides of any steps have handrails. Any raised decks and porches should have guardrails on the edges. Have any leaves, snow, or ice cleared regularly. Use sand or salt on walking paths during winter. Clean up any spills in your garage right away. This includes oil or grease spills. What can I do in the bathroom? Use night lights. Install grab bars by the toilet and in the tub and shower. Do not use towel bars as grab bars. Use non-skid mats or decals in the tub or shower. If you need to sit down in the shower, use a plastic, non-slip stool. Keep the floor dry. Clean up any water that spills on the floor as soon as it happens. Remove soap buildup in the tub or shower regularly. Attach bath mats securely with double-sided  non-slip rug tape. Do not have throw rugs and other things on the floor that can make you trip. What can I do in the bedroom? Use night lights. Make sure that you have a light by your bed that is easy to reach. Do not use any sheets or blankets that are too big for your bed. They should not hang down onto the floor. Have a firm chair that has side arms. You can use this for support while you get dressed. Do not have throw rugs and other things on the floor that can make you trip. What can I do in the kitchen? Clean up any spills right away. Avoid walking on wet floors. Keep items that you use a lot in easy-to-reach places. If you need to reach something above you, use a strong step stool that has a grab bar. Keep electrical cords out of the way. Do not use floor polish or wax that makes floors slippery. If you must use wax, use non-skid floor wax. Do not have throw rugs and other things on the floor that can make you trip. What can I do with my stairs? Do not leave any items on the stairs. Make sure that there are handrails on both sides of the stairs and use them. Fix handrails that are broken or loose. Make sure that handrails are as long as the stairways. Check any carpeting to make sure that it is firmly attached to the stairs. Fix any carpet that is loose  or worn. Avoid having throw rugs at the top or bottom of the stairs. If you do have throw rugs, attach them to the floor with carpet tape. Make sure that you have a light switch at the top of the stairs and the bottom of the stairs. If you do not have them, ask someone to add them for you. What else can I do to help prevent falls? Wear shoes that: Do not have high heels. Have rubber bottoms. Are comfortable and fit you well. Are closed at the toe. Do not wear sandals. If you use a stepladder: Make sure that it is fully opened. Do not climb a closed stepladder. Make sure that both sides of the stepladder are locked into place. Ask  someone to hold it for you, if possible. Clearly mark and make sure that you can see: Any grab bars or handrails. First and last steps. Where the edge of each step is. Use tools that help you move around (mobility aids) if they are needed. These include: Canes. Walkers. Scooters. Crutches. Turn on the lights when you go into a dark area. Replace any light bulbs as soon as they burn out. Set up your furniture so you have a clear path. Avoid moving your furniture around. If any of your floors are uneven, fix them. If there are any pets around you, be aware of where they are. Review your medicines with your doctor. Some medicines can make you feel dizzy. This can increase your chance of falling. Ask your doctor what other things that you can do to help prevent falls. This information is not intended to replace advice given to you by your health care provider. Make sure you discuss any questions you have with your health care provider. Document Released: 10/01/2009 Document Revised: 05/12/2016 Document Reviewed: 01/09/2015 Elsevier Interactive Patient Education  2017 [employer].

## 2022-07-04 NOTE — Progress Notes (Addendum)
I connected with Dawn Tucker today by telephone and verified that I am speaking with the correct person using two identifiers. Location patient: home Location provider: work Persons participating in the virtual visit: [name], Kook LPN.   I discussed the limitations, risks, security and privacy concerns of performing an evaluation and management service by telephone and the availability of in person appointments. I also discussed with the patient that there may be a patient responsible charge related to this service. The patient expressed understanding and verbally consented to this telephonic visit.    Interactive audio and video telecommunications were attempted between this provider and patient, however failed, due to patient having technical difficulties OR patient did not have access to video capability.  We continued and completed visit with audio only.     Vital signs may be patient reported or missing.  Subjective:   Dawn Tucker is a 76 y.o. female who presents for Medicare Annual (Subsequent) preventive examination.  Review of Systems     Cardiac Risk Factors include: advanced age (>83mn, >>61women);dyslipidemia;hypertension     Objective:    Today's Vitals   07/04/22 1114  Weight: 112 lb (50.8 kg)  Height: 5' 3"  (1.6 m)   Body mass index is 19.84 kg/m.     07/04/2022   11:19 AM 10/06/2015   11:48 AM  Advanced Directives  Does Patient Have a Medical Advance Directive? Yes Yes  [place]   Copy of [facility]? No - copy requested Yes    Current Medications (verified) Outpatient Encounter Medications as of 07/04/2022  Medication Sig   ALPRAZolam (XANAX) 0.25 MG tablet Take 1 tablet (0.25 mg total) by mouth 2 (two) times daily as needed for anxiety.   atorvastatin (LIPITOR) 10 MG tablet TAKE 1 TABLET(10 MG) BY MOUTH DAILY   Ca Carbonate-Mag Hydroxide  (ROLAIDS) 550-110 MG CHEW Chew by mouth. Take 2 chews every day   Cholecalciferol (VITAMIN D-3) 125 MCG (5000 UT) TABS Take 1 tablet by mouth daily.   escitalopram (LEXAPRO) 5 MG tablet TAKE 1 TABLET(5 MG) BY MOUTH DAILY   estradiol (ESTRACE) 0.1 MG/GM vaginal cream 0.5-1 gram vaginally twice weekly   losartan (COZAAR) 25 MG tablet Take 1 tablet (25 mg total) by mouth daily.   Multiple Vitamin (MULTIVITAMIN) tablet Take 1 tablet by mouth daily.   ondansetron (ZOFRAN) 4 MG tablet Take 1 tablet (4 mg total) by mouth every 8 (eight) hours as needed for nausea or vomiting.   pantoprazole (PROTONIX) 40 MG tablet TAKE 1 TABLET(40 MG) BY MOUTH DAILY   PROLIA 60 MG/ML SOSY injection Inject into the skin once. Inject into the skin once every six months   promethazine (PHENERGAN) 25 MG tablet Take 1 tablet (25 mg total) by mouth every 8 (eight) hours as needed for nausea or vomiting.   SYNTHROID 88 MCG tablet TAKE 1 TABLET(88 MCG) BY MOUTH DAILY BEFORE BREAKFAST   traZODone (DESYREL) 50 MG tablet Take 0.5-1 tablets (25-50 mg total) by mouth at bedtime.   valACYclovir (VALTREX) 500 MG tablet TAKE 1 TABLET(500 MG) BY MOUTH DAILY   scopolamine (TRANSDERM-SCOP) 1 MG/3DAYS Place 1 patch (1.5 mg total) onto the skin every 3 (three) days. (Patient not taking: Reported on 07/04/2022)   No facility-administered encounter medications on file as of 07/04/2022.    Allergies (verified) Sulfamethoxazole-trimethoprim, Sulfonamide derivatives, and Sulfur   History: Past Medical History:  Diagnosis Date   Abnormal Pap  smear of cervix    CHEST PAIN 03/23/2010   DEGENERATIVE DISC DISEASE 08/07/2008   Dysphagia    DYSPHAGIA UNSPECIFIED 03/23/2010   ESOPHAGEAL STRICTURE 01/10/2011   FIBROIDS, UTERUS 08/07/2008   GERD 03/23/2010   Herpes    HIATAL HERNIA 01/10/2011   HYPOTHYROIDISM 08/07/2008   IRRITABLE BOWEL SYNDROME, HX OF 08/07/2008   Macular degeneration 2019   R mild dry   Mixed hyperlipidemia 08/07/2008    Osteoporosis 08/2019   T score -2.5 stable from prior DEXA   PERSONAL HX COLONIC POLYPS 03/23/2010   Scarlet fever 08/07/2008   Unspecified pruritic disorder 08/07/2008   VAGINITIS, ATROPHIC 08/07/2008   Past Surgical History:  Procedure Laterality Date   BREAST EXCISIONAL BIOPSY Bilateral    benign   BREAST SURGERY     Breast biopsies right & left- benign lesions   CESAREAN SECTION     COLONOSCOPY     COLPOSCOPY     GYNECOLOGIC CRYOSURGERY  by Dr. Dorathy Kinsman   low-grade dysplasia   RHINOPLASTY     TUBAL LIGATION     Family History  Problem Relation Age of Onset   Pneumonia Mother    Heart disease Mother 54       CAD   Coronary artery disease Father    Heart disease Father 81       CAD/MI-fatal   Breast cancer Paternal Aunt 45   Ovarian cancer Paternal Aunt        dx in her 59s   Prostate cancer Maternal Grandfather    Breast cancer Sister 40       BRCA negative   Hypertension Sister    Diabetes Neg Hx    Colon cancer Neg Hx    Rectal cancer Neg Hx    Stomach cancer Neg Hx    Esophageal cancer Neg Hx    Social History   Socioeconomic History   Marital status: Widowed    Spouse name: Not on file   Number of children: 2   Years of education: Not on file   Highest education level: Not on file  Occupational History    Employer: UNEMPLOYED  Tobacco Use   Smoking status: Never   Smokeless tobacco: Never  Vaping Use   Vaping Use: Never used  Substance and Sexual Activity   Alcohol use: Yes    Comment: occassional   Drug use: No   Sexual activity: Not Currently    Partners: Male    Birth control/protection: Post-menopausal    Comment: INTERCOURSE AGE 61, LESS THAN 5 SEXUAL PARTNERS  Other Topics Concern   Not on file  Social History Narrative   HSG, [employer]. Married 35'. 1 son- '79, 1 daughter- '71; 2 grandchildren. Marriage is in good health.Very active in community affairs   Social Determinants of Health   Financial Resource Strain: Low Risk   (07/04/2022)   Overall Financial Resource Strain (CARDIA)    Difficulty of Paying Living Expenses: Not hard at all  Food Insecurity: No Food Insecurity (07/04/2022)   Hunger Vital Sign    Worried About Running Out of Food in the Last Year: Never true    Ran Out of Food in the Last Year: Never true  Transportation Needs: No Transportation Needs (07/04/2022)   PRAPARE - Hydrologist (Medical): No    Lack of Transportation (Non-Medical): No  Physical Activity: Sufficiently Active (07/04/2022)   Exercise Vital Sign    Days of Exercise per Week: 5 days  Minutes of Exercise per Session: 60 min  Stress: No Stress Concern Present (07/04/2022)   [place]    Feeling of Stress : Not at all  Social Connections: Not on file    Tobacco Counseling Counseling given: Not Answered   Clinical Intake:  Pre-visit preparation completed: Yes  Pain : No/denies pain     Nutritional Status: BMI of 19-24  Normal Nutritional Risks: None Diabetes: No  How often do you need to have someone help you when you read instructions, pamphlets, or other written materials from your doctor or pharmacy?: 1 - Never What is the last grade level you completed in school?: college  Diabetic? no  Interpreter Needed?: No  Information entered by :: NAllen LPN   Activities of Daily Living    07/04/2022   11:20 AM  In your present state of health, do you have any difficulty performing the following activities:  Hearing? 0  Vision? 0  Difficulty concentrating or making decisions? 0  Walking or climbing stairs? 0  Dressing or bathing? 0  Doing errands, shopping? 0  Preparing Food and eating ? N  Using the Toilet? N  In the past six months, have you accidently leaked urine? Y  Do you have problems with loss of bowel control? N  Managing your Medications? N  Managing your Finances? N  Housekeeping or managing your  Housekeeping? N    Patient Care Team: Plotnikov, Evie Lacks, MD as PCP - General (Internal Medicine) Elsie Saas, MD (Orthopedic Surgery) Irene Shipper, MD (Gastroenterology) Crista Luria, MD (Dermatology) Rutherford Guys, MD (Ophthalmology) Phineas Real, Belinda Block, MD (Inactive) as Consulting Physician (Gynecology)  Indicate any recent Medical Services you may have received from other than [facility] providers in the past year (date may be approximate).     Assessment:   This is a routine wellness examination for Ceaira.  Hearing/Vision screen Vision Screening - Comments:: Regular eye exams, [facility], Dr. Sharen Counter  Dietary issues and exercise activities discussed: Current Exercise Habits: Structured exercise class, Type of exercise: Other - see comments (pilates and eliptical), Time (Minutes): 60, Frequency (Times/Week): 5, Weekly Exercise (Minutes/Week): 300   Goals Addressed             This Visit's Progress    Patient Stated       07/04/2022, maintain current health       Depression Screen    07/04/2022   11:20 AM 06/09/2022   11:32 AM 06/02/2021   10:53 AM 11/05/2018   10:22 AM 10/31/2017    9:54 AM 10/17/2016    9:49 AM 10/06/2015   11:54 AM  PHQ 2/9 Scores  PHQ - 2 Score 0 0 0 0 0 1 0  PHQ- 9 Score   0        Fall Risk    07/04/2022   11:20 AM 06/09/2022   11:32 AM 06/02/2021   10:53 AM 11/13/2019    5:03 PM 11/05/2018   10:22 AM  Fall Risk   Falls in the past year? 0 0 0 0 0  Comment    Emmi Telephone Survey: data to providers prior to load   Number falls in past yr: 0 0 0    Injury with Fall? 0 0 0    Risk for fall due to : Medication side effect No Fall Risks No Fall Risks    Follow up Falls evaluation completed;Education provided;Falls prevention discussed Falls evaluation completed   Falls  evaluation completed    FALL RISK PREVENTION PERTAINING TO THE HOME:  Any stairs in or around the home? Yes  If so, are there any without handrails? No   Home free of loose throw rugs in walkways, pet beds, electrical cords, etc? Yes  Adequate lighting in your home to reduce risk of falls? Yes   ASSISTIVE DEVICES UTILIZED TO PREVENT FALLS:  Life alert? No  Use of a cane, walker or w/c? No  Grab bars in the bathroom? No  Shower chair or bench in shower? No  Elevated toilet seat or a handicapped toilet? No   TIMED UP AND GO:  Was the test performed? No .      Cognitive Function:        07/04/2022   11:21 AM  6CIT Screen  What Year? 0 points  What month? 0 points  What time? 0 points  Count back from 20 0 points  Months in reverse 0 points  Repeat phrase 2 points  Total Score 2 points    Immunizations Immunization History  Administered Date(s) Administered   Fluad Quad(high Dose 65+) 08/31/2019   H1N1 11/26/2008   Influenza Split 08/28/2012   Influenza, High Dose Seasonal PF 10/04/2016, 08/16/2017, 09/12/2018, 09/01/2021   Influenza,inj,Quad PF,6+ Mos 09/16/2013, 09/23/2014, 10/06/2015   Moderna Sars-Covid-2 Vaccination 03/13/2021   PFIZER(Purple Top)SARS-COV-2 Vaccination 12/29/2019, 01/18/2020, 08/10/2020   Pfizer Covid-19 Vaccine Bivalent Booster 27yr & up 09/01/2021   Pneumococcal Conjugate-13 09/16/2013   Pneumococcal Polysaccharide-23 07/28/2010, 08/15/2011   Td 09/19/2009   Tetanus 03/18/2013   Zoster Recombinat (Shingrix) 10/31/2017, 01/25/2018   Zoster, Live 09/14/2006    TDAP status: Up to date  Flu Vaccine status: Up to date  Pneumococcal vaccine status: Up to date  Covid-19 vaccine status: Completed vaccines  Qualifies for Shingles Vaccine? Yes   Zostavax completed Yes   Shingrix Completed?: Yes  Screening Tests Health Maintenance  Topic Date Due   Pneumonia Vaccine 76 Years old (3 - PPSV23 or PCV20) 08/14/2016   COVID-19 Vaccine (6 - Pfizer series) 01/01/2022   INFLUENZA VACCINE  07/19/2022   TETANUS/TDAP  03/19/2023   DEXA SCAN  Completed   Hepatitis C Screening  Completed    Zoster Vaccines- Shingrix  Completed   HPV VACCINES  Aged Out   COLONOSCOPY (Pts 45-441yrInsurance coverage will need to be confirmed)  Discontinued    Health Maintenance  Health Maintenance Due  Topic Date Due   Pneumonia Vaccine 6542Years old (3 - PPSV23 or PCV20) 08/14/2016   COVID-19 Vaccine (6 - Pfizer series) 01/01/2022    Colorectal cancer screening: No longer required.   Mammogram status: Completed 11/22/2021. Repeat every year  Bone Density status: Completed 09/07/2021.   Lung Cancer Screening: (Low Dose CT Chest recommended if Age 76-80ears, 30 pack-year currently smoking OR have quit w/in 15years.) does not qualify.   Lung Cancer Screening Referral: no  Additional Screening:  Hepatitis C Screening: does qualify; Completed 10/19/2016  Vision Screening: Recommended annual ophthalmology exams for early detection of glaucoma and other disorders of the eye. Is the patient up to date with their annual eye exam?  Yes  Who is the provider or what is the name of the office in which the patient attends annual eye exams? [facility], would they like to be referred to a provider to establish care? No .   Dental Screening: Recommended annual dental exams for proper oral hygiene  [facility]  Referral / Chronic Care Management: CRR required this visit?  No   CCM required this visit?  No      Plan:     I have personally reviewed and noted the following in the patient's chart:   Medical and social history Use of alcohol, tobacco or illicit drugs  Current medications and supplements including opioid prescriptions.  Functional ability and status Nutritional status Physical activity Advanced directives List of other physicians Hospitalizations, surgeries, and ER visits in previous 12 months Vitals Screenings to include cognitive, depression, and falls Referrals and appointments  In addition, I have reviewed and  discussed with patient certain preventive protocols, quality metrics, and best practice recommendations. A written personalized care plan for preventive services as well as general preventive health recommendations were provided to patient.     Kellie Simmering, LPN   [id number]/52/[id number]   Nurse Notes: none  Due to this being a virtual visit, the after visit summary with patients personalized plan was offered to patient via mail or my-chart. Patient would like to access on my-chart   Medical screening examination/treatment/procedure(s) were performed by non-physician practitioner and as supervising physician I was immediately available for consultation/collaboration.  I agree with above. Lew Dawes, MD

## 2022-07-14 ENCOUNTER — Other Ambulatory Visit: Payer: Self-pay

## 2022-07-14 DIAGNOSIS — N393 Stress incontinence (female) (male): Secondary | ICD-10-CM

## 2022-07-18 ENCOUNTER — Telehealth: Payer: Self-pay

## 2022-07-18 DIAGNOSIS — N393 Stress incontinence (female) (male): Secondary | ICD-10-CM

## 2022-07-18 MED ORDER — ESTRADIOL 0.1 MG/GM VA CREA: TOPICAL_CREAM | VAGINAL | 0 refills | Status: DC

## 2022-07-18 NOTE — Telephone Encounter (Signed)
Patient called requesting refill on Estradiol Cream. She was aware she needs to schedule AEX but asked for refill since she is leaving town Wed for 3 weeks.  Last AEX 03/15/2021.

## 2022-08-10 IMAGING — MG MM DIGITAL SCREENING BILAT W/ TOMO AND CAD
8 series · 9 of 24 positions shown · non-contrast
Comparison: Previous exam(s).

CLINICAL DATA: Screening.

EXAM:
DIGITAL SCREENING BILATERAL MAMMOGRAM WITH TOMOSYNTHESIS AND CAD
TECHNIQUE: Bilateral screening digital craniocaudal and mediolateral oblique
mammograms were obtained. Bilateral screening digital breast
tomosynthesis was performed. The images were evaluated with
computer-aided detection.

[L MLO synth-2D]
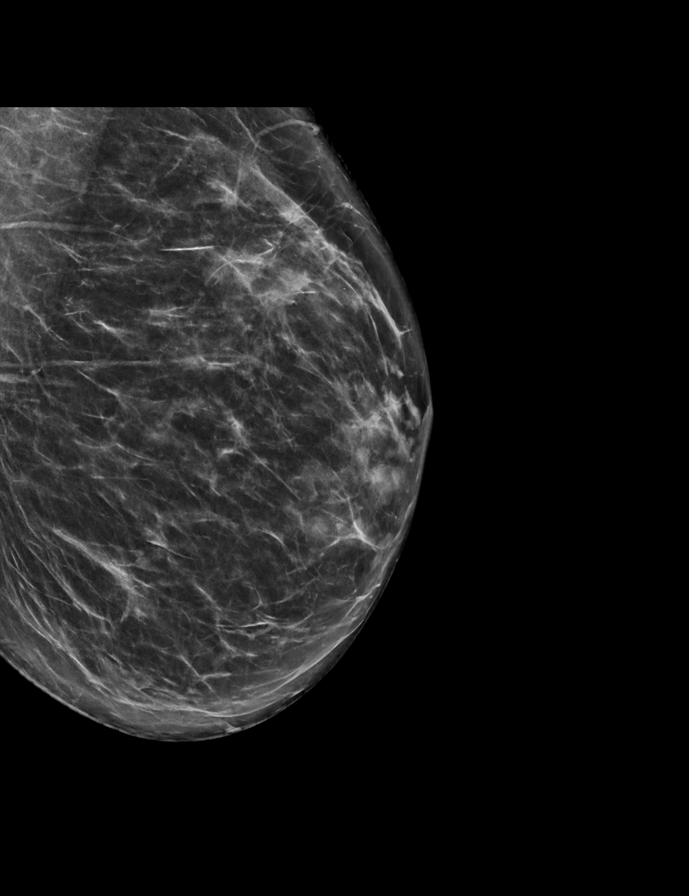

[R CC synth-2D]
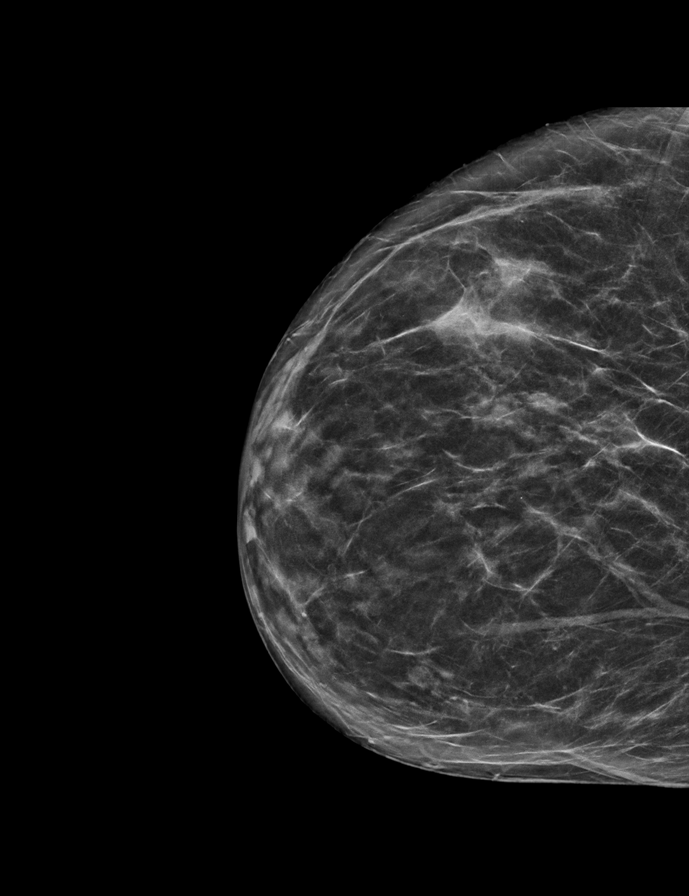

[L CC synth-2D]
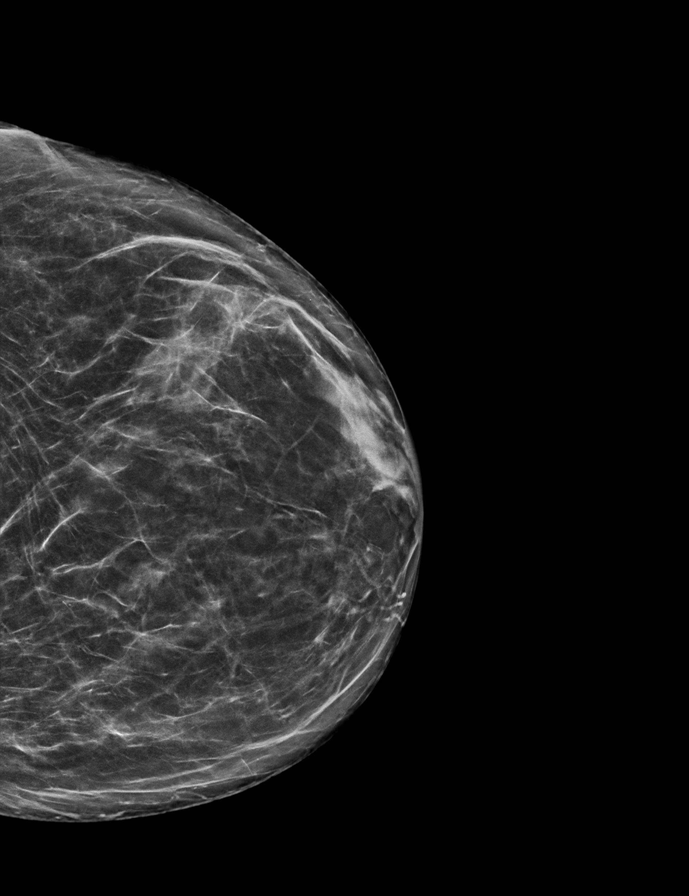

[R MLO synth-2D]
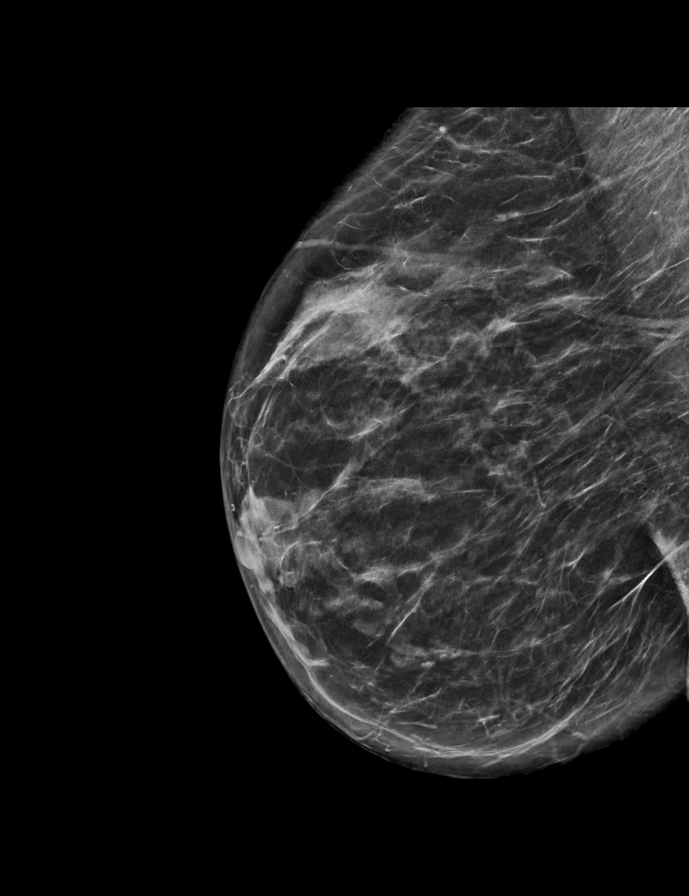

[L MLO tomo · 2 of 69 frames shown]
[frame 23/69]
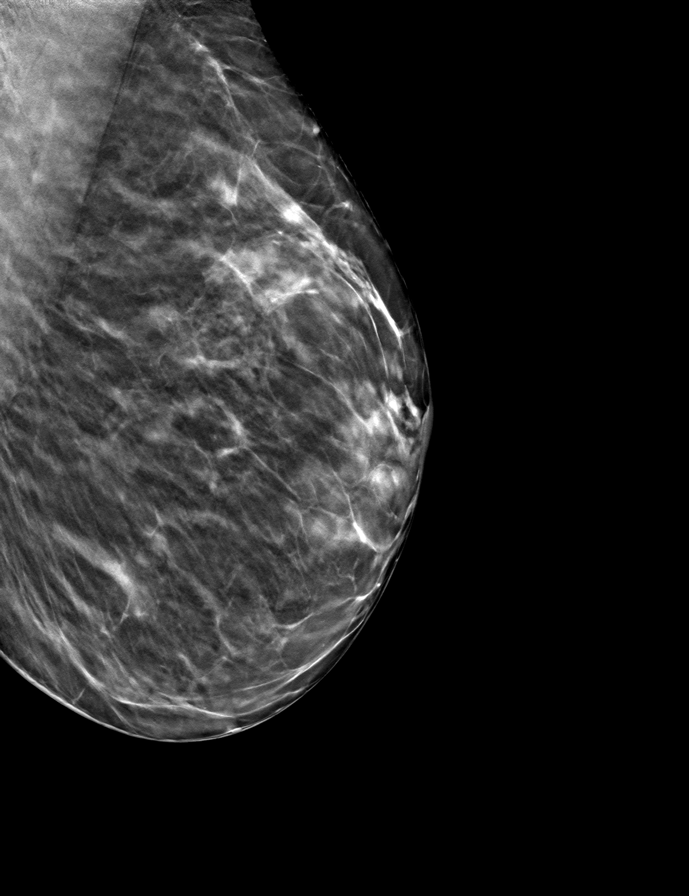
[frame 35/69]
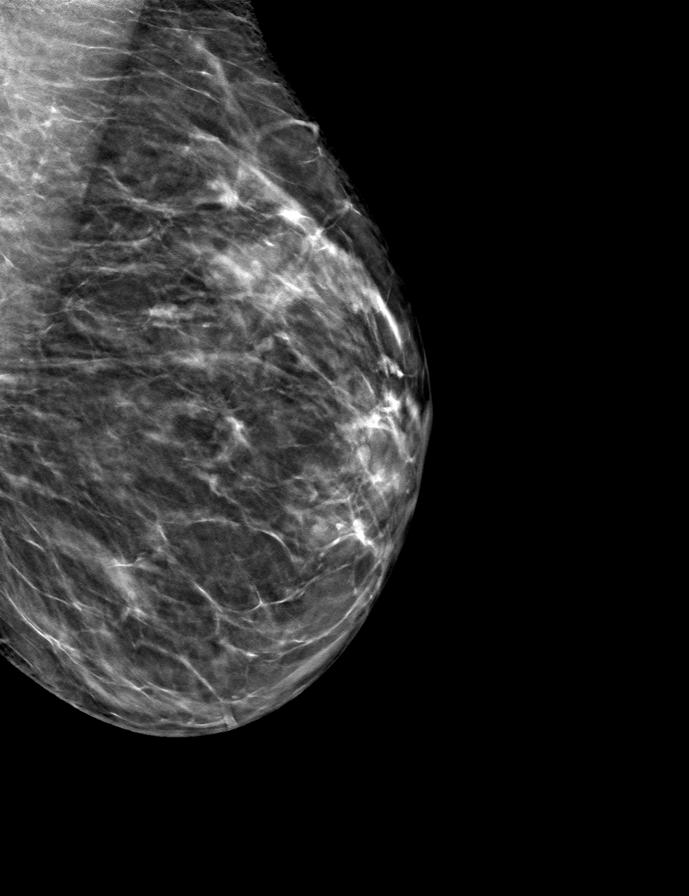

[L CC tomo · tomo slice 36/71.0]
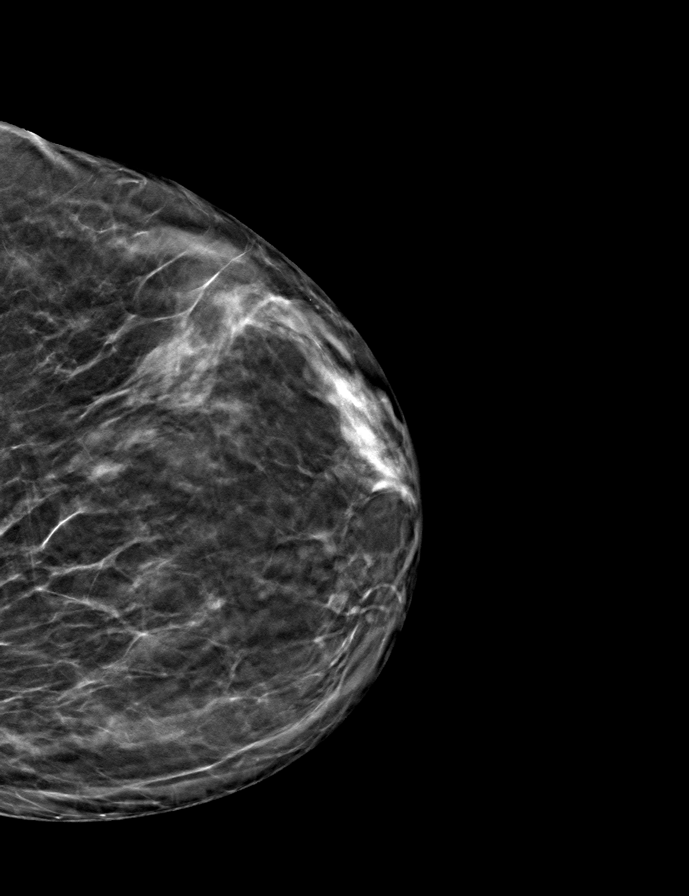

[R CC tomo · tomo slice 31/61.0]
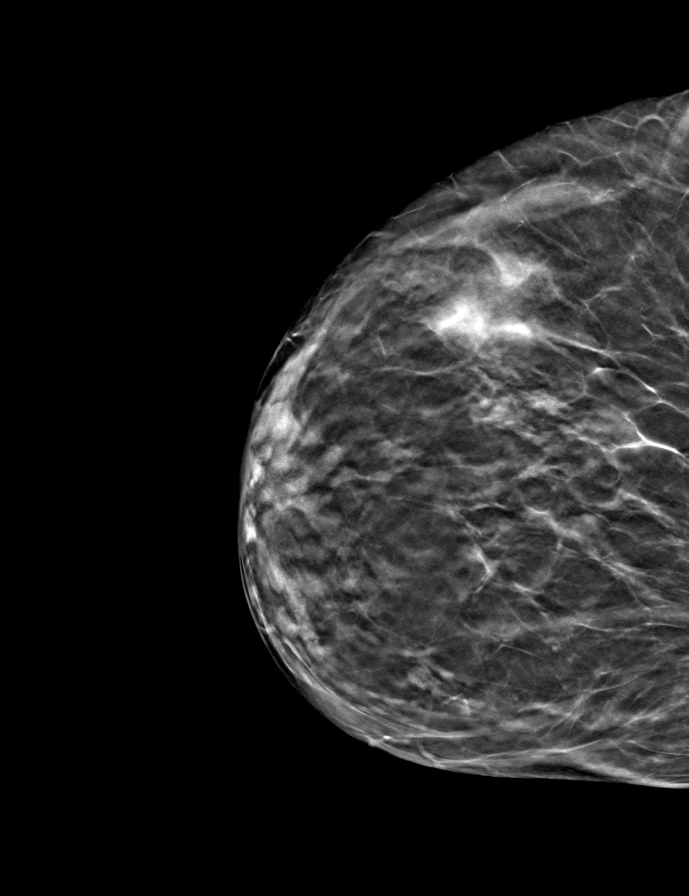

[R MLO tomo · tomo slice 33/65.0]
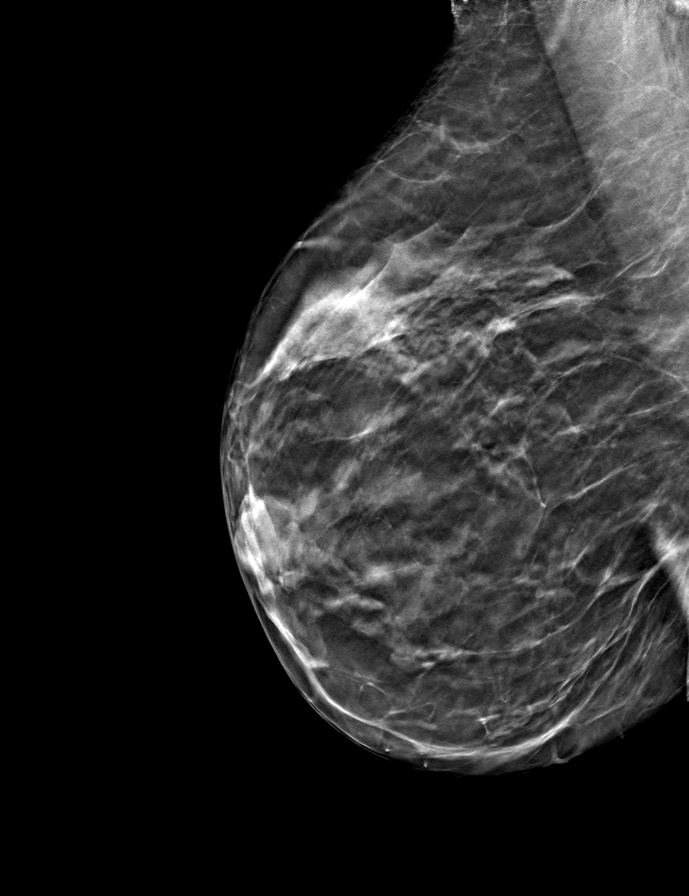

[9 of 24 positions shown; findings below may reference images not displayed]

ACR Breast Density Category b: There are scattered areas of
fibroglandular density.
FINDINGS: There are no findings suspicious for malignancy.
IMPRESSION: No mammographic evidence of malignancy. A result letter of this
screening mammogram will be mailed directly to the patient.

RECOMMENDATION:
Screening mammogram in one year. (Code:51-O-LD2)

BI-RADS CATEGORY  1: Negative.

## 2022-08-20 ENCOUNTER — Other Ambulatory Visit: Payer: Self-pay | Admitting: Internal Medicine

## 2022-09-05 DIAGNOSIS — Z23 Encounter for immunization: Secondary | ICD-10-CM | POA: Diagnosis not present

## 2022-09-14 ENCOUNTER — Encounter: Payer: Self-pay | Admitting: Internal Medicine

## 2022-09-14 ENCOUNTER — Ambulatory Visit (INDEPENDENT_AMBULATORY_CARE_PROVIDER_SITE_OTHER): Payer: Medicare Other | Admitting: Internal Medicine

## 2022-09-14 VITALS — BP 122/72 | HR 58 | Temp 98.2°F | Ht 63.25 in | Wt 119.0 lb

## 2022-09-14 DIAGNOSIS — N1831 Chronic kidney disease, stage 3a: Secondary | ICD-10-CM | POA: Diagnosis not present

## 2022-09-14 DIAGNOSIS — Z23 Encounter for immunization: Secondary | ICD-10-CM | POA: Diagnosis not present

## 2022-09-14 DIAGNOSIS — E559 Vitamin D deficiency, unspecified: Secondary | ICD-10-CM

## 2022-09-14 DIAGNOSIS — Z0000 Encounter for general adult medical examination without abnormal findings: Secondary | ICD-10-CM

## 2022-09-14 DIAGNOSIS — E039 Hypothyroidism, unspecified: Secondary | ICD-10-CM | POA: Diagnosis not present

## 2022-09-14 DIAGNOSIS — I10 Essential (primary) hypertension: Secondary | ICD-10-CM | POA: Diagnosis not present

## 2022-09-14 DIAGNOSIS — M810 Age-related osteoporosis without current pathological fracture: Secondary | ICD-10-CM | POA: Diagnosis not present

## 2022-09-14 LAB — CBC WITH DIFFERENTIAL/PLATELET
Basophils Absolute: 0 10*3/uL (ref 0.0–0.1)
Basophils Relative: 0.4 % (ref 0.0–3.0)
Eosinophils Absolute: 0.1 10*3/uL (ref 0.0–0.7)
Eosinophils Relative: 2.1 % (ref 0.0–5.0)
HCT: 36.9 % (ref 36.0–46.0)
Hemoglobin: 12.3 g/dL (ref 12.0–15.0)
Lymphocytes Relative: 25.1 % (ref 12.0–46.0)
Lymphs Abs: 1.3 10*3/uL (ref 0.7–4.0)
MCHC: 33.2 g/dL (ref 30.0–36.0)
MCV: 93.3 fl (ref 78.0–100.0)
Monocytes Absolute: 0.5 10*3/uL (ref 0.1–1.0)
Monocytes Relative: 8.7 % (ref 3.0–12.0)
Neutro Abs: 3.4 10*3/uL (ref 1.4–7.7)
Neutrophils Relative %: 63.7 % (ref 43.0–77.0)
Platelets: 212 10*3/uL (ref 150.0–400.0)
RBC: 3.96 Mil/uL (ref 3.87–5.11)
RDW: 13 % (ref 11.5–15.5)
WBC: 5.3 10*3/uL (ref 4.0–10.5)

## 2022-09-14 LAB — COMPREHENSIVE METABOLIC PANEL
ALT: 34 U/L (ref 0–35)
AST: 28 U/L (ref 0–37)
Albumin: 4.2 g/dL (ref 3.5–5.2)
Alkaline Phosphatase: 70 U/L (ref 39–117)
BUN: 21 mg/dL (ref 6–23)
CO2: 30 mEq/L (ref 19–32)
Calcium: 8.9 mg/dL (ref 8.4–10.5)
Chloride: 103 mEq/L (ref 96–112)
Creatinine, Ser: 1.06 mg/dL (ref 0.40–1.20)
GFR: 51.25 mL/min — ABNORMAL LOW (ref >60.00)
Glucose, Bld: 93 mg/dL (ref 70–99)
Potassium: 4.5 mEq/L (ref 3.5–5.1)
Sodium: 138 mEq/L (ref 135–145)
Total Bilirubin: 0.5 mg/dL (ref 0.2–1.2)
Total Protein: 7.3 g/dL (ref 6.0–8.3)

## 2022-09-14 LAB — URINALYSIS
Bilirubin Urine: NEGATIVE
Hgb urine dipstick: NEGATIVE
Ketones, ur: NEGATIVE
Leukocytes,Ua: NEGATIVE
Nitrite: NEGATIVE
Specific Gravity, Urine: <=1.005 — AB (ref 1.000–1.030)
Total Protein, Urine: NEGATIVE
Urine Glucose: NEGATIVE
Urobilinogen, UA: 0.2 (ref 0.0–1.0)
pH: 5.5 (ref 5.0–8.0)

## 2022-09-14 LAB — LIPID PANEL
Cholesterol: 181 mg/dL (ref 0–200)
HDL: 53 mg/dL (ref >39.00)
LDL Cholesterol: 107 mg/dL — ABNORMAL HIGH (ref 0–99)
NonHDL: 127.65
Total CHOL/HDL Ratio: 3
Triglycerides: 103 mg/dL (ref 0.0–149.0)
VLDL: 20.6 mg/dL (ref 0.0–40.0)

## 2022-09-14 LAB — VITAMIN D 25 HYDROXY (VIT D DEFICIENCY, FRACTURES): VITD: 44.57 ng/mL (ref 30.00–100.00)

## 2022-09-14 LAB — T4, FREE: Free T4: 1.2 ng/dL (ref 0.60–1.60)

## 2022-09-14 LAB — TSH: TSH: 0.15 u[IU]/mL — ABNORMAL LOW (ref 0.35–5.50)

## 2022-09-14 NOTE — Assessment & Plan Note (Addendum)
On Levothroid 88 mcg/d Obtain thyroid tests

## 2022-09-14 NOTE — Progress Notes (Signed)
Subjective:  Patient ID: BETHENE HANKINSON, female    DOB: [birth year]  Age: 76 y.o. MRN: [id number]  CC: Follow-up (Want blood work done)   HPI [employer] presents for hypothyroidism, anxiety, hypertension follow-up  Outpatient Medications Prior to Visit  Medication Sig Dispense Refill   ALPRAZolam (XANAX) 0.25 MG tablet Take 1 tablet (0.25 mg total) by mouth 2 (two) times daily as needed for anxiety. 30 tablet 1   atorvastatin (LIPITOR) 10 MG tablet TAKE 1 TABLET(10 MG) BY MOUTH DAILY 90 tablet 3   Ca Carbonate-Mag Hydroxide (ROLAIDS) 550-110 MG CHEW Chew by mouth. Take 2 chews every day     Cholecalciferol (VITAMIN D-3) 125 MCG (5000 UT) TABS Take 1 tablet by mouth daily.     escitalopram (LEXAPRO) 5 MG tablet TAKE 1 TABLET(5 MG) BY MOUTH DAILY 90 tablet 3   estradiol (ESTRACE) 0.1 MG/GM vaginal cream 0.5-1 gram vaginally twice weekly 42.5 g 0   losartan (COZAAR) 25 MG tablet Take 1 tablet (25 mg total) by mouth daily. 90 tablet 3   Multiple Vitamin (MULTIVITAMIN) tablet Take 1 tablet by mouth daily.     ondansetron (ZOFRAN) 4 MG tablet Take 1 tablet (4 mg total) by mouth every 8 (eight) hours as needed for nausea or vomiting. 20 tablet 0   pantoprazole (PROTONIX) 40 MG tablet TAKE 1 TABLET(40 MG) BY MOUTH DAILY 90 tablet 1   PROLIA 60 MG/ML SOSY injection Inject into the skin once. Inject into the skin once every six months     promethazine (PHENERGAN) 25 MG tablet Take 1 tablet (25 mg total) by mouth every 8 (eight) hours as needed for nausea or vomiting. 20 tablet 0   SYNTHROID 88 MCG tablet TAKE 1 TABLET(88 MCG) BY MOUTH DAILY BEFORE BREAKFAST 90 tablet 1   traZODone (DESYREL) 50 MG tablet TAKE 1/2 TO 1 TABLET(25 TO 50 MG) BY MOUTH AT BEDTIME 90 tablet 3   valACYclovir (VALTREX) 500 MG tablet TAKE 1 TABLET(500 MG) BY MOUTH DAILY 90 tablet 1   scopolamine (TRANSDERM-SCOP) 1 MG/3DAYS Place 1 patch (1.5 mg total) onto the skin every 3 (three) days. (Patient not taking: Reported  on 07/04/2022) 10 patch 12   No facility-administered medications prior to visit.    ROS: Review of Systems  Constitutional:  Positive for unexpected weight change. Negative for activity change, appetite change, chills and fatigue.  HENT:  Negative for congestion, mouth sores and sinus pressure.   Eyes:  Negative for visual disturbance.  Respiratory:  Negative for cough and chest tightness.   Gastrointestinal:  Negative for abdominal pain and nausea.  Genitourinary:  Negative for difficulty urinating, frequency and vaginal pain.  Musculoskeletal:  Negative for back pain and gait problem.  Skin:  Negative for pallor and rash.  Neurological:  Negative for dizziness, tremors, weakness, numbness and headaches.  Psychiatric/Behavioral:  Negative for confusion, sleep disturbance and suicidal ideas. The patient is not nervous/anxious.     Objective:  BP 122/72 (BP Location: Left Arm)   Pulse (!) 58   Temp 98.2 F (36.8 C) (Oral)   Ht 5' 3.25" (1.607 m)   Wt 119 lb (54 kg)   SpO2 97%   BMI 20.91 kg/m   BP Readings from Last 3 Encounters:  09/14/22 122/72  06/09/22 118/82  12/02/21 118/80    Wt Readings from Last 3 Encounters:  09/14/22 119 lb (54 kg)  07/04/22 112 lb (50.8 kg)  06/09/22 114 lb (51.7 kg)    Physical Exam  Constitutional:      General: She is not in acute distress.    Appearance: Normal appearance. She is well-developed.  HENT:     Head: Normocephalic.     Right Ear: External ear normal.     Left Ear: External ear normal.     Nose: Nose normal.  Eyes:     General:        Right eye: No discharge.        Left eye: No discharge.     Conjunctiva/sclera: Conjunctivae normal.     Pupils: Pupils are equal, round, and reactive to light.  Neck:     Thyroid: No thyromegaly.     Vascular: No JVD.     Trachea: No tracheal deviation.  Cardiovascular:     Rate and Rhythm: Normal rate and regular rhythm.     Heart sounds: Normal heart sounds.  Pulmonary:      Effort: No respiratory distress.     Breath sounds: No stridor. No wheezing.  Abdominal:     General: Bowel sounds are normal. There is no distension.     Palpations: Abdomen is soft. There is no mass.     Tenderness: There is no abdominal tenderness. There is no guarding or rebound.  Musculoskeletal:        General: No tenderness.     Cervical back: Normal range of motion and neck supple. No rigidity.  Lymphadenopathy:     Cervical: No cervical adenopathy.  Skin:    Findings: No erythema or rash.  Neurological:     Cranial Nerves: No cranial nerve deficit.     Motor: No abnormal muscle tone.     Coordination: Coordination normal.     Deep Tendon Reflexes: Reflexes normal.  Psychiatric:        Behavior: Behavior normal.        Thought Content: Thought content normal.        Judgment: Judgment normal.     Lab Results  Component Value Date   WBC 5.3 09/14/2022   HGB 12.3 09/14/2022   HCT 36.9 09/14/2022   PLT 212.0 09/14/2022   GLUCOSE 93 09/14/2022   CHOL 181 09/14/2022   TRIG 103.0 09/14/2022   HDL 53.00 09/14/2022   LDLCALC 107 (H) 09/14/2022   ALT 34 09/14/2022   AST 28 09/14/2022   NA 138 09/14/2022   K 4.5 09/14/2022   CL 103 09/14/2022   CREATININE 1.06 09/14/2022   BUN 21 09/14/2022   CO2 30 09/14/2022   TSH 0.15 (L) 09/14/2022   HGBA1C 5.9 10/02/2015    MM 3D SCREEN BREAST BILATERAL  Result Date: 11/22/2021 CLINICAL DATA:  Screening. EXAM: DIGITAL SCREENING BILATERAL MAMMOGRAM WITH TOMOSYNTHESIS AND CAD TECHNIQUE: Bilateral screening digital craniocaudal and mediolateral oblique mammograms were obtained. Bilateral screening digital breast tomosynthesis was performed. The images were evaluated with computer-aided detection. COMPARISON:  Previous exam(s). ACR Breast Density Category b: There are scattered areas of fibroglandular density. FINDINGS: There are no findings suspicious for malignancy. IMPRESSION: No mammographic evidence of malignancy. A result letter  of this screening mammogram will be mailed directly to the patient. RECOMMENDATION: Screening mammogram in one year. (Code:SM-B-01Y) BI-RADS CATEGORY  1: Negative. Electronically Signed   By: Lajean Manes M.D.   On: 11/22/2021 12:31    Assessment & Plan:   Problem List Items Addressed This Visit     Chronic renal impairment, stage 3a ([facility])    Hydrate well.  Continue to monitor GFR  Essential hypertension    On Losartan We will check c-Met      Hypothyroidism - Primary    On Levothroid 88 mcg/d Obtain thyroid tests      Osteoporosis    On Prolia, vitamin D, calcium Check labs w/CMET, Vit D      Well adult exam   Other Visit Diagnoses     Needs flu shot       Relevant Orders   Flu Vaccine QUAD High Dose(Fluad) (Completed)   Vitamin D deficiency       Relevant Orders   VITAMIN D 25 Hydroxy (Vit-D Deficiency, Fractures) (Completed)         No orders of the defined types were placed in this encounter.     Follow-up: Return in about 6 months (around 03/15/2023) for Wellness Exam, a follow-up visit.  Walker Kehr, MD

## 2022-09-14 NOTE — Patient Instructions (Addendum)
You can get Prevnar 20 (a pneumonia shot)  Try Colace 1-2 a day   Senokot-S is another option

## 2022-09-14 NOTE — Assessment & Plan Note (Addendum)
On Losartan We will check c-Met

## 2022-09-14 NOTE — Assessment & Plan Note (Addendum)
On Prolia, vitamin D, calcium Check labs w/CMET, Vit D

## 2022-09-16 NOTE — Assessment & Plan Note (Signed)
Hydrate well.  Continue to monitor GFR

## 2022-09-20 DIAGNOSIS — H353132 Nonexudative age-related macular degeneration, bilateral, intermediate dry stage: Secondary | ICD-10-CM | POA: Diagnosis not present

## 2022-09-20 DIAGNOSIS — Z961 Presence of intraocular lens: Secondary | ICD-10-CM | POA: Diagnosis not present

## 2022-11-01 DIAGNOSIS — E559 Vitamin D deficiency, unspecified: Secondary | ICD-10-CM | POA: Diagnosis not present

## 2022-11-01 DIAGNOSIS — M810 Age-related osteoporosis without current pathological fracture: Secondary | ICD-10-CM | POA: Diagnosis not present

## 2022-11-02 ENCOUNTER — Telehealth: Payer: Self-pay | Admitting: Internal Medicine

## 2022-11-02 DIAGNOSIS — R300 Dysuria: Secondary | ICD-10-CM | POA: Diagnosis not present

## 2022-11-02 DIAGNOSIS — N390 Urinary tract infection, site not specified: Secondary | ICD-10-CM

## 2022-11-02 NOTE — Telephone Encounter (Signed)
Patient thinks she has a uti - she would like to come in and leave a urine sample.  Please let her know if Dr. Camila Li will order one.

## 2022-11-03 NOTE — Telephone Encounter (Signed)
Notified pt w/MD response.../lmb 

## 2022-11-03 NOTE — Telephone Encounter (Signed)
Okay UA.  Thank you

## 2022-11-04 ENCOUNTER — Other Ambulatory Visit: Payer: Self-pay | Admitting: Internal Medicine

## 2022-11-04 DIAGNOSIS — Z1231 Encounter for screening mammogram for malignant neoplasm of breast: Secondary | ICD-10-CM

## 2022-11-18 ENCOUNTER — Other Ambulatory Visit: Payer: Self-pay | Admitting: Internal Medicine

## 2022-11-23 ENCOUNTER — Other Ambulatory Visit: Payer: Self-pay | Admitting: Nurse Practitioner

## 2022-11-23 ENCOUNTER — Ambulatory Visit
Admission: RE | Admit: 2022-11-23 | Discharge: 2022-11-23 | Disposition: A | Discharge status: Home / Self Care | Payer: Medicare Other | Source: Ambulatory Visit | Attending: Internal Medicine | Admitting: Internal Medicine

## 2022-11-23 DIAGNOSIS — Z1231 Encounter for screening mammogram for malignant neoplasm of breast: Secondary | ICD-10-CM

## 2022-11-23 DIAGNOSIS — N393 Stress incontinence (female) (male): Secondary | ICD-10-CM

## 2022-11-24 NOTE — Telephone Encounter (Signed)
Breast and pelvic 02/2021 Scheduled on 04/26/23

## 2022-12-22 ENCOUNTER — Ambulatory Visit: Payer: Medicare Other | Admitting: Internal Medicine

## 2023-01-06 ENCOUNTER — Ambulatory Visit: Payer: Medicare Other

## 2023-02-16 ENCOUNTER — Other Ambulatory Visit: Payer: Self-pay | Admitting: Internal Medicine

## 2023-02-21 ENCOUNTER — Encounter: Payer: Self-pay | Admitting: Internal Medicine

## 2023-02-21 MED ORDER — PANTOPRAZOLE SODIUM 40 MG PO TBEC: 40.0000 mg | DELAYED_RELEASE_TABLET | Freq: Every day | ORAL | 1 refills | Status: DC

## 2023-02-28 DIAGNOSIS — D2272 Melanocytic nevi of left lower limb, including hip: Secondary | ICD-10-CM | POA: Diagnosis not present

## 2023-02-28 DIAGNOSIS — L814 Other melanin hyperpigmentation: Secondary | ICD-10-CM | POA: Diagnosis not present

## 2023-02-28 DIAGNOSIS — L820 Inflamed seborrheic keratosis: Secondary | ICD-10-CM | POA: Diagnosis not present

## 2023-02-28 DIAGNOSIS — D1801 Hemangioma of skin and subcutaneous tissue: Secondary | ICD-10-CM | POA: Diagnosis not present

## 2023-02-28 DIAGNOSIS — D225 Melanocytic nevi of trunk: Secondary | ICD-10-CM | POA: Diagnosis not present

## 2023-02-28 DIAGNOSIS — L821 Other seborrheic keratosis: Secondary | ICD-10-CM | POA: Diagnosis not present

## 2023-03-02 NOTE — Progress Notes (Signed)
This encounter was created in error - please disregard.

## 2023-04-25 DIAGNOSIS — H35132 Retinopathy of prematurity, stage 2, left eye: Secondary | ICD-10-CM | POA: Diagnosis not present

## 2023-04-25 DIAGNOSIS — H26492 Other secondary cataract, left eye: Secondary | ICD-10-CM | POA: Diagnosis not present

## 2023-04-26 ENCOUNTER — Encounter: Payer: Self-pay | Admitting: Obstetrics & Gynecology

## 2023-04-26 ENCOUNTER — Other Ambulatory Visit (HOSPITAL_COMMUNITY)
Admission: RE | Admit: 2023-04-26 | Discharge: 2023-04-26 | Disposition: A | Discharge status: Home / Self Care | Payer: Medicare Other | Source: Ambulatory Visit | Attending: Obstetrics & Gynecology | Admitting: Obstetrics & Gynecology

## 2023-04-26 ENCOUNTER — Ambulatory Visit (INDEPENDENT_AMBULATORY_CARE_PROVIDER_SITE_OTHER): Payer: Medicare Other | Admitting: Obstetrics & Gynecology

## 2023-04-26 VITALS — BP 120/70 | HR 66 | Ht 62.75 in | Wt 113.0 lb

## 2023-04-26 DIAGNOSIS — Z9189 Other specified personal risk factors, not elsewhere classified: Secondary | ICD-10-CM | POA: Diagnosis not present

## 2023-04-26 DIAGNOSIS — Z01419 Encounter for gynecological examination (general) (routine) without abnormal findings: Secondary | ICD-10-CM

## 2023-04-26 DIAGNOSIS — N951 Menopausal and female climacteric states: Secondary | ICD-10-CM | POA: Diagnosis not present

## 2023-04-26 DIAGNOSIS — Z780 Asymptomatic menopausal state: Secondary | ICD-10-CM

## 2023-04-26 DIAGNOSIS — B009 Herpesviral infection, unspecified: Secondary | ICD-10-CM | POA: Diagnosis not present

## 2023-04-26 DIAGNOSIS — M8589 Other specified disorders of bone density and structure, multiple sites: Secondary | ICD-10-CM | POA: Diagnosis not present

## 2023-04-26 DIAGNOSIS — Z9289 Personal history of other medical treatment: Secondary | ICD-10-CM

## 2023-04-26 DIAGNOSIS — Z124 Encounter for screening for malignant neoplasm of cervix: Secondary | ICD-10-CM | POA: Diagnosis not present

## 2023-04-26 NOTE — Progress Notes (Signed)
Dawn Tucker 24-Jul-1946 [id number]   History:    77 y.o. G2P2L2 Widowed.  New boyfriend.  RP:  Established patient for Breast and Pelvic exam  HPI:  Postmenopause, well on no systemic HRT.  No PMB.  No pelvic pain.  Has started a new relationship.  Vaginal dryness with IC.  Recommend coconut oil.  Will also restart Estradiol cream 1/4 applicator twice a week.  Last Pap Neg 02/2021.  Pap reflex today. Breasts normal.  Mammo Neg 11/2022.  Colono 07/2019.  BD Osteopenia Rt Fem Neck -2.2 in 08/2021.  BMI 20.18.  Physically active.  Health labs with Fam MD.   Past medical history,surgical history, family history and social history were all reviewed and documented in the EPIC chart.  Gynecologic History No LMP recorded. Patient is postmenopausal.  Obstetric History OB History  Gravida Para Term Preterm AB Living  2 2 2     2   SAB IAB Ectopic Multiple Live Births               # Outcome Date GA Lbr Len/2nd Weight Sex Delivery Anes PTL Lv  2 Term           1 Term              ROS: A ROS was performed and pertinent positives and negatives are included in the history. GENERAL: No fevers or chills. HEENT: No change in vision, no earache, sore throat or sinus congestion. NECK: No pain or stiffness. CARDIOVASCULAR: No chest pain or pressure. No palpitations. PULMONARY: No shortness of breath, cough or wheeze. GASTROINTESTINAL: No abdominal pain, nausea, vomiting or diarrhea, melena or bright red blood per rectum. GENITOURINARY: No urinary frequency, urgency, hesitancy or dysuria. MUSCULOSKELETAL: No joint or muscle pain, no back pain, no recent trauma. DERMATOLOGIC: No rash, no itching, no lesions. ENDOCRINE: No polyuria, polydipsia, no heat or cold intolerance. No recent change in weight. HEMATOLOGICAL: No anemia or easy bruising or bleeding. NEUROLOGIC: No headache, seizures, numbness, tingling or weakness. PSYCHIATRIC: No depression, no loss of interest in normal activity or change in sleep  pattern.     Exam:   BP 120/70   Pulse 66   Ht 5' 2.75" (1.594 m)   Wt 113 lb (51.3 kg)   SpO2 98%   BMI 20.18 kg/m   Body mass index is 20.18 kg/m.  General appearance : Well developed well nourished female. No acute distress HEENT: Eyes: no retinal hemorrhage or exudates,  Neck supple, trachea midline, no carotid bruits, no thyroidmegaly Lungs: Clear to auscultation, no rhonchi or wheezes, or rib retractions  Heart: Regular rate and rhythm, no murmurs or gallops Breast:Examined in sitting and supine position were symmetrical in appearance, no palpable masses or tenderness,  no skin retraction, no nipple inversion, no nipple discharge, no skin discoloration, no axillary or supraclavicular lymphadenopathy Abdomen: no palpable masses or tenderness, no rebound or guarding Extremities: no edema or skin discoloration or tenderness  Pelvic: Vulva: Normal             Vagina: No gross lesions or discharge  Cervix: No gross lesions or discharge.  Pap reflex done.  Uterus  AV, normal size, shape and consistency, non-tender and mobile  Adnexa  Without masses or tenderness  Anus: Normal   Assessment/Plan:  77 y.o. female for annual exam   1. Encounter for routine gynecological examination with Papanicolaou smear of cervix Postmenopause, well on no systemic HRT.  No PMB.  No pelvic pain.  Has  started a new relationship.  Vaginal dryness with IC.  Recommend coconut oil.  Will also restart Estradiol cream 1/4 applicator twice a week.  Last Pap Neg 02/2021.  Pap reflex today. Breasts normal.  Mammo Neg 11/2022.  Colono 07/2019.  BD Osteopenia Rt Fem Neck -2.2 in 08/2021.  BMI 20.18.  Physically active.  Health labs with Fam MD. - Cytology - PAP( [place])  2. Postmenopause Postmenopause, well on no systemic HRT.  No PMB.  No pelvic pain.   3. Menopausal vaginal dryness Has started a new relationship.  Vaginal dryness with IC.  Recommend coconut oil.  Will also restart Estradiol cream 1/4  applicator twice a week.   4. Osteopenia of multiple sites BD Osteopenia Rt Fem Neck -2.2 in 08/2021.  Repeat BD 08/2023.  5. Personal history of other medical treatment   Genia Del MD, 10:46 AM

## 2023-04-27 DIAGNOSIS — M810 Age-related osteoporosis without current pathological fracture: Secondary | ICD-10-CM | POA: Diagnosis not present

## 2023-04-27 DIAGNOSIS — M25551 Pain in right hip: Secondary | ICD-10-CM | POA: Diagnosis not present

## 2023-04-27 LAB — CYTOLOGY - PAP: Diagnosis: NEGATIVE

## 2023-05-22 ENCOUNTER — Other Ambulatory Visit: Payer: Self-pay | Admitting: Internal Medicine

## 2023-05-23 ENCOUNTER — Other Ambulatory Visit: Payer: Self-pay | Admitting: Internal Medicine

## 2023-05-26 ENCOUNTER — Encounter: Payer: Self-pay | Admitting: Internal Medicine

## 2023-06-13 ENCOUNTER — Other Ambulatory Visit: Payer: Self-pay | Admitting: Internal Medicine

## 2023-06-13 ENCOUNTER — Other Ambulatory Visit (INDEPENDENT_AMBULATORY_CARE_PROVIDER_SITE_OTHER): Payer: Medicare Other

## 2023-06-13 ENCOUNTER — Telehealth: Payer: Self-pay | Admitting: Internal Medicine

## 2023-06-13 ENCOUNTER — Encounter: Payer: Self-pay | Admitting: Internal Medicine

## 2023-06-13 ENCOUNTER — Ambulatory Visit (INDEPENDENT_AMBULATORY_CARE_PROVIDER_SITE_OTHER): Payer: Medicare Other | Admitting: Internal Medicine

## 2023-06-13 ENCOUNTER — Ambulatory Visit: Payer: Medicare Other | Admitting: Internal Medicine

## 2023-06-13 VITALS — BP 110/68 | HR 65 | Temp 98.0°F | Ht 62.75 in | Wt 111.0 lb

## 2023-06-13 DIAGNOSIS — E039 Hypothyroidism, unspecified: Secondary | ICD-10-CM | POA: Diagnosis not present

## 2023-06-13 DIAGNOSIS — M810 Age-related osteoporosis without current pathological fracture: Secondary | ICD-10-CM | POA: Diagnosis not present

## 2023-06-13 DIAGNOSIS — E785 Hyperlipidemia, unspecified: Secondary | ICD-10-CM

## 2023-06-13 DIAGNOSIS — N1831 Chronic kidney disease, stage 3a: Secondary | ICD-10-CM | POA: Diagnosis not present

## 2023-06-13 DIAGNOSIS — R140 Abdominal distension (gaseous): Secondary | ICD-10-CM | POA: Diagnosis not present

## 2023-06-13 DIAGNOSIS — I10 Essential (primary) hypertension: Secondary | ICD-10-CM

## 2023-06-13 LAB — COMPREHENSIVE METABOLIC PANEL
ALT: 20 U/L (ref 0–35)
AST: 24 U/L (ref 0–37)
Albumin: 4.4 g/dL (ref 3.5–5.2)
Alkaline Phosphatase: 59 U/L (ref 39–117)
BUN: 24 mg/dL — ABNORMAL HIGH (ref 6–23)
CO2: 29 mEq/L (ref 19–32)
Calcium: 10 mg/dL (ref 8.4–10.5)
Chloride: 99 mEq/L (ref 96–112)
Creatinine, Ser: 1.28 mg/dL — ABNORMAL HIGH (ref 0.40–1.20)
GFR: 40.66 mL/min — ABNORMAL LOW (ref >60.00)
Glucose, Bld: 95 mg/dL (ref 70–99)
Potassium: 4.5 mEq/L (ref 3.5–5.1)
Sodium: 136 mEq/L (ref 135–145)
Total Bilirubin: 0.6 mg/dL (ref 0.2–1.2)
Total Protein: 7.3 g/dL (ref 6.0–8.3)

## 2023-06-13 LAB — T4, FREE: Free T4: 1.09 ng/dL (ref 0.60–1.60)

## 2023-06-13 LAB — CBC WITH DIFFERENTIAL/PLATELET
Basophils Absolute: 0 10*3/uL (ref 0.0–0.1)
Basophils Relative: 0.6 % (ref 0.0–3.0)
Eosinophils Absolute: 0.2 10*3/uL (ref 0.0–0.7)
Eosinophils Relative: 3.8 % (ref 0.0–5.0)
HCT: 38.4 % (ref 36.0–46.0)
Hemoglobin: 12.6 g/dL (ref 12.0–15.0)
Lymphocytes Relative: 29.5 % (ref 12.0–46.0)
Lymphs Abs: 1.4 10*3/uL (ref 0.7–4.0)
MCHC: 32.9 g/dL (ref 30.0–36.0)
MCV: 93.1 fl (ref 78.0–100.0)
Monocytes Absolute: 0.5 10*3/uL (ref 0.1–1.0)
Monocytes Relative: 9.3 % (ref 3.0–12.0)
Neutro Abs: 2.8 10*3/uL (ref 1.4–7.7)
Neutrophils Relative %: 56.8 % (ref 43.0–77.0)
Platelets: 257 10*3/uL (ref 150.0–400.0)
RBC: 4.12 Mil/uL (ref 3.87–5.11)
RDW: 13.4 % (ref 11.5–15.5)
WBC: 4.9 10*3/uL (ref 4.0–10.5)

## 2023-06-13 LAB — URINALYSIS
Bilirubin Urine: NEGATIVE
Hgb urine dipstick: NEGATIVE
Ketones, ur: NEGATIVE
Leukocytes,Ua: NEGATIVE
Nitrite: NEGATIVE
Specific Gravity, Urine: 1.01 (ref 1.000–1.030)
Total Protein, Urine: NEGATIVE
Urine Glucose: NEGATIVE
Urobilinogen, UA: 0.2 (ref 0.0–1.0)
pH: 6 (ref 5.0–8.0)

## 2023-06-13 LAB — TSH: TSH: 1.43 u[IU]/mL (ref 0.35–5.50)

## 2023-06-13 LAB — LIPID PANEL
Cholesterol: 197 mg/dL (ref 0–200)
HDL: 63 mg/dL (ref >39.00)
LDL Cholesterol: 113 mg/dL — ABNORMAL HIGH (ref 0–99)
NonHDL: 134.04
Total CHOL/HDL Ratio: 3
Triglycerides: 103 mg/dL (ref 0.0–149.0)
VLDL: 20.6 mg/dL (ref 0.0–40.0)

## 2023-06-13 MED ORDER — ALIGN 4 MG PO CAPS: 1.0000 | ORAL_CAPSULE | Freq: Every day | ORAL | 1 refills | Status: AC

## 2023-06-13 MED ORDER — METRONIDAZOLE 250 MG PO TABS: 250.0000 mg | ORAL_TABLET | Freq: Three times a day (TID) | ORAL | 0 refills | Status: DC

## 2023-06-13 NOTE — Progress Notes (Signed)
Subjective:  Patient ID: Dawn Tucker, female    DOB: Jun 29, 1946  Age: 77 y.o. MRN: [id number]  CC: Follow-up   HPI Dawn Tucker presents for hypothyroidism, HTN, CRI C/o intestinal gas x months  Outpatient Medications Prior to Visit  Medication Sig Dispense Refill   ALPRAZolam (XANAX) 0.25 MG tablet Take 1 tablet (0.25 mg total) by mouth 2 (two) times daily as needed for anxiety. 30 tablet 1   atorvastatin (LIPITOR) 10 MG tablet TAKE 1 TABLET(10 MG) BY MOUTH DAILY 90 tablet 3   Ca Carbonate-Mag Hydroxide (ROLAIDS) 550-110 MG CHEW Chew by mouth. Take 2 chews every day     Cholecalciferol (VITAMIN D-3) 125 MCG (5000 UT) TABS Take 1 tablet by mouth daily.     escitalopram (LEXAPRO) 5 MG tablet TAKE 1 TABLET(5 MG) BY MOUTH DAILY 90 tablet 2   estradiol (ESTRACE) 0.1 MG/GM vaginal cream USE 0.5 TO 1 GRAM VAGINALLY 2 TIMES A WEEK 42.5 g 0   losartan (COZAAR) 25 MG tablet TAKE 1 TABLET(25 MG) BY MOUTH DAILY 90 tablet 2   Multiple Vitamin (MULTIVITAMIN) tablet Take 1 tablet by mouth daily.     ondansetron (ZOFRAN) 4 MG tablet Take 1 tablet (4 mg total) by mouth every 8 (eight) hours as needed for nausea or vomiting. 20 tablet 0   pantoprazole (PROTONIX) 40 MG tablet Take 1 tablet (40 mg total) by mouth daily. 90 tablet 1   PROLIA 60 MG/ML SOSY injection Inject into the skin once. Inject into the skin once every six months     promethazine (PHENERGAN) 25 MG tablet Take 1 tablet (25 mg total) by mouth every 8 (eight) hours as needed for nausea or vomiting. 20 tablet 0   SYNTHROID 88 MCG tablet Take 1 tablet (88 mcg total) by mouth daily before breakfast. Annual appt is due w/labs must see provider for future refills 30 tablet 0   traZODone (DESYREL) 50 MG tablet TAKE 1/2 TO 1 TABLET(25 TO 50 MG) BY MOUTH AT BEDTIME 90 tablet 3   valACYclovir (VALTREX) 500 MG tablet TAKE 1 TABLET(500 MG) BY MOUTH DAILY 90 tablet 1   No facility-administered medications prior to visit.    ROS: Review  of Systems  Constitutional:  Negative for activity change, appetite change, chills, fatigue and unexpected weight change.  HENT:  Negative for congestion, mouth sores and sinus pressure.   Eyes:  Negative for visual disturbance.  Respiratory:  Negative for cough and chest tightness.   Gastrointestinal:  Negative for abdominal pain and nausea.  Genitourinary:  Negative for difficulty urinating, frequency and vaginal pain.  Musculoskeletal:  Negative for back pain and gait problem.  Skin:  Negative for pallor and rash.  Neurological:  Negative for dizziness, tremors, weakness, numbness and headaches.  Psychiatric/Behavioral:  Negative for confusion and sleep disturbance.     Objective:  BP 110/68 (BP Location: Right Arm, Patient Position: Sitting, Cuff Size: Normal)   Pulse 65   Temp 98 F (36.7 C) (Oral)   Ht 5' 2.75" (1.594 m)   Wt 111 lb (50.3 kg)   SpO2 97%   BMI 19.82 kg/m   BP Readings from Last 3 Encounters:  06/13/23 110/68  04/26/23 120/70  09/14/22 122/72    Wt Readings from Last 3 Encounters:  06/13/23 111 lb (50.3 kg)  04/26/23 113 lb (51.3 kg)  09/14/22 119 lb (54 kg)    Physical Exam Constitutional:      General: She is not in acute distress.  Appearance: She is well-developed.  HENT:     Head: Normocephalic.     Right Ear: External ear normal.     Left Ear: External ear normal.     Nose: Nose normal.  Eyes:     General:        Right eye: No discharge.        Left eye: No discharge.     Conjunctiva/sclera: Conjunctivae normal.     Pupils: Pupils are equal, round, and reactive to light.  Neck:     Thyroid: No thyromegaly.     Vascular: No JVD.     Trachea: No tracheal deviation.  Cardiovascular:     Rate and Rhythm: Normal rate and regular rhythm.     Heart sounds: Normal heart sounds.  Pulmonary:     Effort: No respiratory distress.     Breath sounds: No stridor. No wheezing.  Abdominal:     General: Bowel sounds are normal. There is no  distension.     Palpations: Abdomen is soft. There is no mass.     Tenderness: There is no abdominal tenderness. There is no guarding or rebound.  Musculoskeletal:        General: No tenderness.     Cervical back: Normal range of motion and neck supple. No rigidity.  Lymphadenopathy:     Cervical: No cervical adenopathy.  Skin:    Findings: No erythema or rash.  Neurological:     Cranial Nerves: No cranial nerve deficit.     Motor: No abnormal muscle tone.     Coordination: Coordination normal.     Deep Tendon Reflexes: Reflexes normal.  Psychiatric:        Behavior: Behavior normal.        Thought Content: Thought content normal.        Judgment: Judgment normal.     Lab Results  Component Value Date   WBC 4.9 06/13/2023   HGB 12.6 06/13/2023   HCT 38.4 06/13/2023   PLT 257.0 06/13/2023   GLUCOSE 95 06/13/2023   CHOL 197 06/13/2023   TRIG 103.0 06/13/2023   HDL 63.00 06/13/2023   LDLCALC 113 (H) 06/13/2023   ALT 20 06/13/2023   AST 24 06/13/2023   NA 136 06/13/2023   K 4.5 06/13/2023   CL 99 06/13/2023   CREATININE 1.28 (H) 06/13/2023   BUN 24 (H) 06/13/2023   CO2 29 06/13/2023   TSH 1.43 06/13/2023   HGBA1C 5.9 10/02/2015    No results found.  Assessment & Plan:   Problem List Items Addressed This Visit     Hypothyroidism    On Levothroid 88 mcg/d Obtain thyroid tests q6-12 months      Osteoporosis - Primary    On Prolia, vitamin D, calcium      Essential hypertension     Watching GFR  Stop Losartan. Re-check labs in 1 month.      Chronic renal impairment, stage 3a ([facility])    Worse Watching GFR Stop Losartan. Re-check labs in 1 month. Consider Renal referral       Bloating    [facility] - Flagyl and Align      Other Visit Diagnoses     Dyslipidemia             Meds ordered this encounter  Medications   Probiotic Product (ALIGN) 4 MG CAPS    Sig: Take 1 capsule (4 mg total) by mouth daily.    Dispense:  30 capsule  Refill:  1   metroNIDAZOLE (FLAGYL) 250 MG tablet    Sig: Take 1 tablet (250 mg total) by mouth 3 (three) times daily.    Dispense:  30 tablet    Refill:  0      Follow-up: Return in about 3 months (around 09/13/2023) for a follow-up visit.  Sonda Primes, MD

## 2023-06-13 NOTE — Assessment & Plan Note (Signed)
On Levothroid 88 mcg/d Obtain thyroid tests q6-12 months

## 2023-06-13 NOTE — Assessment & Plan Note (Addendum)
Worse Watching GFR Stop Losartan. Re-check labs in 1 month. Consider Renal referral

## 2023-06-13 NOTE — Assessment & Plan Note (Signed)
[facility] - Flagyl and Align

## 2023-06-13 NOTE — Telephone Encounter (Signed)
Note not needed 

## 2023-06-13 NOTE — Assessment & Plan Note (Addendum)
  Watching GFR  Stop Losartan. Re-check labs in 1 month.

## 2023-06-13 NOTE — Patient Instructions (Signed)
Stop Losartan. Re-check labs in 1 month.

## 2023-06-13 NOTE — Assessment & Plan Note (Signed)
On Prolia, vitamin D, calcium

## 2023-06-14 ENCOUNTER — Other Ambulatory Visit: Payer: Self-pay | Admitting: Internal Medicine

## 2023-06-14 MED ORDER — SYNTHROID 88 MCG PO TABS: 88.0000 ug | ORAL_TABLET | Freq: Every day | ORAL | 11 refills | Status: DC

## 2023-06-15 ENCOUNTER — Telehealth: Payer: Self-pay | Admitting: Pharmacy Technician

## 2023-06-15 ENCOUNTER — Other Ambulatory Visit: Payer: Self-pay

## 2023-06-15 NOTE — Telephone Encounter (Signed)
Auth Submission: NO AUTH NEEDED Site of care: Site of care: [facility] Payer: MEDICARE A/B & BCBS SUPP Medication & CPT/J Code(s) submitted: Prolia (Denosumab) [id number] Route of submission (phone, fax, portal):  Phone # Fax # Auth type: Buy/Bill Units/visits requested: X2 Reference number:  Approval from: 06/15/23 to 06/14/24

## 2023-06-19 ENCOUNTER — Other Ambulatory Visit: Payer: Self-pay | Admitting: Internal Medicine

## 2023-07-26 ENCOUNTER — Ambulatory Visit: Payer: Medicare Other

## 2023-07-26 ENCOUNTER — Telehealth: Payer: Self-pay | Admitting: Internal Medicine

## 2023-07-26 ENCOUNTER — Other Ambulatory Visit: Payer: Self-pay | Admitting: Internal Medicine

## 2023-07-26 VITALS — BP 161/82 | HR 71 | Temp 98.1°F | Resp 18

## 2023-07-26 DIAGNOSIS — M810 Age-related osteoporosis without current pathological fracture: Secondary | ICD-10-CM

## 2023-07-26 MED ORDER — DENOSUMAB 60 MG/ML ~~LOC~~ SOSY
60.0000 mg | PREFILLED_SYRINGE | Freq: Once | SUBCUTANEOUS | Status: AC
  Administered 2023-07-26: 60 mg via SUBCUTANEOUS

## 2023-07-26 NOTE — Telephone Encounter (Signed)
Patient called and said she just had a prolia injection at another location. They checked her bp and said it was high. She had no symptoms, but was recently taken off of her losartan. Patient has been scheduled for first opening with any provider for tomorrow with Dr. Jonny Ruiz. Best callback is [phone].

## 2023-07-26 NOTE — Progress Notes (Signed)
Diagnosis: Osteoporosis  Provider:  Mannam, Praveen MD  Procedure: Injection  Prolia (Denosumab), Dose: 60 mg, Site: subcutaneous, Number of injections: 1  Post Care:     Discharge: Condition: Good, Destination: Home . AVS Provided  Performed by:    , RN       

## 2023-07-27 ENCOUNTER — Ambulatory Visit: Payer: Medicare Other | Admitting: Internal Medicine

## 2023-07-27 VITALS — Ht 62.75 in

## 2023-07-27 DIAGNOSIS — R0989 Other specified symptoms and signs involving the circulatory and respiratory systems: Secondary | ICD-10-CM

## 2023-07-27 DIAGNOSIS — I10 Essential (primary) hypertension: Secondary | ICD-10-CM

## 2023-07-27 DIAGNOSIS — U071 COVID-19: Secondary | ICD-10-CM | POA: Diagnosis not present

## 2023-07-27 DIAGNOSIS — N1831 Chronic kidney disease, stage 3a: Secondary | ICD-10-CM | POA: Diagnosis not present

## 2023-07-27 DIAGNOSIS — N289 Disorder of kidney and ureter, unspecified: Secondary | ICD-10-CM | POA: Diagnosis not present

## 2023-07-27 LAB — BASIC METABOLIC PANEL
BUN: 15 mg/dL (ref 6–23)
CO2: 30 mEq/L (ref 19–32)
Calcium: 9.1 mg/dL (ref 8.4–10.5)
Chloride: 102 mEq/L (ref 96–112)
Creatinine, Ser: 1.08 mg/dL (ref 0.40–1.20)
GFR: 49.81 mL/min — ABNORMAL LOW (ref >60.00)
Glucose, Bld: 118 mg/dL — ABNORMAL HIGH (ref 70–99)
Potassium: 3.9 mEq/L (ref 3.5–5.1)
Sodium: 138 mEq/L (ref 135–145)

## 2023-07-27 LAB — POC COVID19 BINAXNOW: SARS Coronavirus 2 Ag: POSITIVE — AB

## 2023-07-27 MED ORDER — NIRMATRELVIR/RITONAVIR (PAXLOVID) TABLET (RENAL DOSING): 2.0000 | ORAL_TABLET | Freq: Two times a day (BID) | ORAL | 0 refills | Status: AC

## 2023-07-27 MED ORDER — AMLODIPINE BESYLATE 2.5 MG PO TABS: 2.5000 mg | ORAL_TABLET | Freq: Every day | ORAL | 3 refills | Status: DC

## 2023-07-27 NOTE — Patient Instructions (Addendum)
Your covid testing was done today- positive  Please take all new medication as prescribed - the amlodipine 2.5 mg per day, and the Paxlovid  Please continue all other medications as before, and refills have been done if requested.  Please have the pharmacy call with any other refills you may need.  Please keep your appointments with your specialists as you may have planned  Please go to the LAB at the blood drawing area for the tests to be done  You will be contacted by phone if any changes need to be made immediately.  Otherwise, you will receive a letter about your results with an explanation, but please check with MyChart first.

## 2023-07-27 NOTE — Progress Notes (Signed)
Patient ID: Dawn Tucker, female   DOB: 1946/07/31, 77 y.o.   MRN: [id number]        Chief Complaint: follow up htn and uri found to be covid +, renal insufficiency       HPI:  Dawn Tucker is a 77 y.o. female here with persistent mild elevated BP in the past several wks having been taken off losartan per pcp due to renal insufficiency; incidentally also has been visiting with daughter with her children both ill with URI in past 2-3 days; Pt denies chest pain, increased sob or doe, wheezing, orthopnea, PND, increased LE swelling, palpitations, dizziness or syncope.   Pt denies polydipsia, polyuria, or new focal neuro s/s.    Pt denies fever, wt loss, night sweats, loss of appetite, or other constitutional symptoms          Wt Readings from Last 3 Encounters:  06/13/23 111 lb (50.3 kg)  04/26/23 113 lb (51.3 kg)  09/14/22 119 lb (54 kg)   BP Readings from Last 3 Encounters:  07/26/23 (!) 161/82  06/13/23 110/68  04/26/23 120/70         Past Medical History:  Diagnosis Date   Abnormal Pap smear of cervix    CHEST PAIN 03/23/2010   DEGENERATIVE DISC DISEASE 08/07/2008   Dysphagia    DYSPHAGIA UNSPECIFIED 03/23/2010   ESOPHAGEAL STRICTURE 01/10/2011   FIBROIDS, UTERUS 08/07/2008   GERD 03/23/2010   Herpes    HIATAL HERNIA 01/10/2011   HYPOTHYROIDISM 08/07/2008   IRRITABLE BOWEL SYNDROME, HX OF 08/07/2008   Macular degeneration 2019   R mild dry   Mixed hyperlipidemia 08/07/2008   Osteoporosis 08/2019   T score -2.5 stable from prior DEXA   PERSONAL HX COLONIC POLYPS 03/23/2010   Scarlet fever 08/07/2008   Unspecified pruritic disorder 08/07/2008   VAGINITIS, ATROPHIC 08/07/2008   Past Surgical History:  Procedure Laterality Date   BREAST EXCISIONAL BIOPSY Bilateral    benign   BREAST SURGERY     Breast biopsies right & left- benign lesions   CESAREAN SECTION     COLONOSCOPY     COLPOSCOPY     GYNECOLOGIC CRYOSURGERY  by Dr. Lauralee Evener   low-grade dysplasia   RHINOPLASTY      TUBAL LIGATION      reports that she has never smoked. She has never used smokeless tobacco. She reports current alcohol use. She reports that she does not use drugs. family history includes Breast cancer (age of onset: 14) in her paternal aunt; Breast cancer (age of onset: 89) in her sister; Coronary artery disease in her father; Heart disease (age of onset: 21) in her father; Heart disease (age of onset: 38) in her mother; Hypertension in her sister; Ovarian cancer in her paternal aunt; Pneumonia in her mother; Prostate cancer in her maternal grandfather. Allergies  Allergen Reactions   Sulfamethoxazole-Trimethoprim Other (See Comments)   Sulfonamide Derivatives Other (See Comments)    REACTION AS A CHILD   Sulfur Nausea And Vomiting   Current Outpatient Medications on File Prior to Visit  Medication Sig Dispense Refill   ALPRAZolam (XANAX) 0.25 MG tablet Take 1 tablet (0.25 mg total) by mouth 2 (two) times daily as needed for anxiety. 30 tablet 1   atorvastatin (LIPITOR) 10 MG tablet TAKE 1 TABLET(10 MG) BY MOUTH DAILY 90 tablet 3   Ca Carbonate-Mag Hydroxide (ROLAIDS) 550-110 MG CHEW Chew by mouth. Take 2 chews every day     Cholecalciferol (VITAMIN D-3) 125 MCG (  5000 UT) TABS Take 1 tablet by mouth daily.     escitalopram (LEXAPRO) 5 MG tablet TAKE 1 TABLET(5 MG) BY MOUTH DAILY 90 tablet 2   estradiol (ESTRACE) 0.1 MG/GM vaginal cream USE 0.5 TO 1 GRAM VAGINALLY 2 TIMES A WEEK 42.5 g 0   Multiple Vitamin (MULTIVITAMIN) tablet Take 1 tablet by mouth daily.     ondansetron (ZOFRAN) 4 MG tablet Take 1 tablet (4 mg total) by mouth every 8 (eight) hours as needed for nausea or vomiting. 20 tablet 0   pantoprazole (PROTONIX) 40 MG tablet TAKE 1 TABLET(40 MG) BY MOUTH DAILY 90 tablet 1   Probiotic Product (ALIGN) 4 MG CAPS Take 1 capsule (4 mg total) by mouth daily. 30 capsule 1   PROLIA 60 MG/ML SOSY injection Inject into the skin once. Inject into the skin once every six months     SYNTHROID  88 MCG tablet Take 1 tablet (88 mcg total) by mouth daily before breakfast. 30 tablet 11   traZODone (DESYREL) 50 MG tablet TAKE 1/2 TO 1 TABLET(25 TO 50 MG) BY MOUTH AT BEDTIME 90 tablet 3   valACYclovir (VALTREX) 500 MG tablet TAKE 1 TABLET(500 MG) BY MOUTH DAILY 90 tablet 1   promethazine (PHENERGAN) 25 MG tablet Take 1 tablet (25 mg total) by mouth every 8 (eight) hours as needed for nausea or vomiting. (Patient not taking: Reported on 07/27/2023) 20 tablet 0   No current facility-administered medications on file prior to visit.        ROS:  All others reviewed and negative.  Objective        PE:  Ht 5' 2.75" (1.594 m)   BMI 19.82 kg/m                 Constitutional: Pt appears in NAD               HENT: Head: NCAT.                Right Ear: External ear normal.                 Left Ear: External ear normal. Bilat tm's with mild erythema.  Max sinus areas mild tender.  Pharynx with mild erythema, no exudate               Eyes: . Pupils are equal, round, and reactive to light. Conjunctivae and EOM are normal               Nose: without d/c or deformity               Neck: Neck supple. Gross normal ROM               Cardiovascular: Normal rate and regular rhythm.                 Pulmonary/Chest: Effort normal and breath sounds without rales or wheezing.                Abd:  Soft, NT, ND, + BS, no organomegaly               Neurological: Pt is alert. At baseline orientation, motor grossly intact               Skin: Skin is warm. No rashes, no other new lesions, LE edema - none               Psychiatric: Pt behavior is normal without agitation  Micro: none  Cardiac tracings I have personally interpreted today:  none  Pertinent Radiological findings (summarize): none   Lab Results  Component Value Date   WBC 4.9 06/13/2023   HGB 12.6 06/13/2023   HCT 38.4 06/13/2023   PLT 257.0 06/13/2023   GLUCOSE 118 (H) 07/27/2023   CHOL 197 06/13/2023   TRIG 103.0 06/13/2023   HDL 63.00  06/13/2023   LDLCALC 113 (H) 06/13/2023   ALT 20 06/13/2023   AST 24 06/13/2023   NA 138 07/27/2023   K 3.9 07/27/2023   CL 102 07/27/2023   CREATININE 1.08 07/27/2023   BUN 15 07/27/2023   CO2 30 07/27/2023   TSH 1.43 06/13/2023   HGBA1C 5.9 10/02/2015   Poct - COVID - positive Covid inf  Assessment/Plan:  Dawn Tucker is a 77 y.o. White or Caucasian [1] female with  has a past medical history of Abnormal Pap smear of cervix, CHEST PAIN (03/23/2010), DEGENERATIVE DISC DISEASE (08/07/2008), Dysphagia, DYSPHAGIA UNSPECIFIED (03/23/2010), ESOPHAGEAL STRICTURE (01/10/2011), FIBROIDS, UTERUS (08/07/2008), GERD (03/23/2010), Herpes, HIATAL HERNIA (01/10/2011), HYPOTHYROIDISM (08/07/2008), IRRITABLE BOWEL SYNDROME, HX OF (08/07/2008), Macular degeneration (2019), Mixed hyperlipidemia (08/07/2008), Osteoporosis (08/2019), PERSONAL HX COLONIC POLYPS (03/23/2010), Scarlet fever (08/07/2008), Unspecified pruritic disorder (08/07/2008), and VAGINITIS, ATROPHIC (08/07/2008).  COVID-19 virus infection Mild to mod, for antibx course paxlovid, cough med prn,,  to f/u any worsening symptoms or concerns  Chronic renal impairment, stage 3a (HCC) With recent mild worsening now off losartan, now for bmp today  Essential hypertension BP Readings from Last 3 Encounters:  07/26/23 (!) 161/82  06/13/23 110/68  04/26/23 120/70   uncontrolled pt to start amlodipine 2.5 mg every day only as she appeared to be sensitive to the losartan and is small by stature  Followup: Return if symptoms worsen or fail to improve.  Oliver Barre, MD 07/29/2023 1:51 PM  [facility]

## 2023-07-29 ENCOUNTER — Encounter: Payer: Self-pay | Admitting: Internal Medicine

## 2023-07-29 DIAGNOSIS — U071 COVID-19: Secondary | ICD-10-CM | POA: Insufficient documentation

## 2023-07-29 NOTE — Assessment & Plan Note (Signed)
Mild to mod, for antibx course paxlovid, cough med prn,,  to f/u any worsening symptoms or concerns

## 2023-07-29 NOTE — Assessment & Plan Note (Signed)
With recent mild worsening now off losartan, now for bmp today

## 2023-07-29 NOTE — Assessment & Plan Note (Addendum)
BP Readings from Last 3 Encounters:  07/26/23 (!) 161/82  06/13/23 110/68  04/26/23 120/70   uncontrolled pt to start amlodipine 2.5 mg every day only as she appeared to be sensitive to the losartan and is small by stature

## 2023-08-03 ENCOUNTER — Encounter (INDEPENDENT_AMBULATORY_CARE_PROVIDER_SITE_OTHER): Payer: Self-pay

## 2023-08-15 ENCOUNTER — Other Ambulatory Visit: Payer: Self-pay | Admitting: Internal Medicine

## 2023-09-14 ENCOUNTER — Ambulatory Visit: Payer: Medicare Other | Admitting: Internal Medicine

## 2023-09-14 ENCOUNTER — Encounter: Payer: Self-pay | Admitting: Internal Medicine

## 2023-09-14 VITALS — BP 108/72 | HR 63 | Temp 98.6°F | Ht 62.75 in | Wt 113.0 lb

## 2023-09-14 DIAGNOSIS — R140 Abdominal distension (gaseous): Secondary | ICD-10-CM | POA: Diagnosis not present

## 2023-09-14 DIAGNOSIS — F5101 Primary insomnia: Secondary | ICD-10-CM | POA: Diagnosis not present

## 2023-09-14 DIAGNOSIS — R739 Hyperglycemia, unspecified: Secondary | ICD-10-CM | POA: Diagnosis not present

## 2023-09-14 DIAGNOSIS — R202 Paresthesia of skin: Secondary | ICD-10-CM

## 2023-09-14 DIAGNOSIS — K623 Rectal prolapse: Secondary | ICD-10-CM

## 2023-09-14 DIAGNOSIS — E039 Hypothyroidism, unspecified: Secondary | ICD-10-CM

## 2023-09-14 DIAGNOSIS — E782 Mixed hyperlipidemia: Secondary | ICD-10-CM

## 2023-09-14 DIAGNOSIS — Z23 Encounter for immunization: Secondary | ICD-10-CM

## 2023-09-14 MED ORDER — PANTOPRAZOLE SODIUM 40 MG PO TBEC: 40.0000 mg | DELAYED_RELEASE_TABLET | Freq: Every day | ORAL | 3 refills | Status: DC

## 2023-09-14 NOTE — Assessment & Plan Note (Signed)
On Levothroid 88 mcg/d Obtain thyroid tests q6-12 months

## 2023-09-14 NOTE — Assessment & Plan Note (Addendum)
New symptoms.  PT for pelvic floor exercises referral Treat gas with Flagyl/probiotic Surgical referral was offered Good hydration, stool softener

## 2023-09-14 NOTE — Progress Notes (Signed)
Subjective:  Patient ID: Dawn Tucker, female    DOB: 1946-06-01  Age: 77 y.o. MRN: [id number]  CC: Follow-up (3 [facility] F/U)   HPI Dawn Tucker presents for new HTN - on Amlodipine 2.5 mg/d F/u CRI, hypothyroidism Dawn Tucker is complaining of to the beginning of rectal prolapse symptoms She continues to have bloating.  She never too metronidazole prescription  Outpatient Medications Prior to Visit  Medication Sig Dispense Refill   ALPRAZolam (XANAX) 0.25 MG tablet Take 1 tablet (0.25 mg total) by mouth 2 (two) times daily as needed for anxiety. 30 tablet 1   amLODipine (NORVASC) 2.5 MG tablet Take 1 tablet (2.5 mg total) by mouth daily. 90 tablet 3   atorvastatin (LIPITOR) 10 MG tablet TAKE 1 TABLET(10 MG) BY MOUTH DAILY 90 tablet 3   Cholecalciferol (VITAMIN D-3) 125 MCG (5000 UT) TABS Take 1 tablet by mouth daily.     escitalopram (LEXAPRO) 5 MG tablet TAKE 1 TABLET(5 MG) BY MOUTH DAILY 90 tablet 2   estradiol (ESTRACE) 0.1 MG/GM vaginal cream USE 0.5 TO 1 GRAM VAGINALLY 2 TIMES A WEEK 42.5 g 0   Multiple Vitamin (MULTIVITAMIN) tablet Take 1 tablet by mouth daily.     ondansetron (ZOFRAN) 4 MG tablet Take 1 tablet (4 mg total) by mouth every 8 (eight) hours as needed for nausea or vomiting. 20 tablet 0   Probiotic Product (ALIGN) 4 MG CAPS Take 1 capsule (4 mg total) by mouth daily. 30 capsule 1   PROLIA 60 MG/ML SOSY injection Inject into the skin once. Inject into the skin once every six months     promethazine (PHENERGAN) 25 MG tablet Take 1 tablet (25 mg total) by mouth every 8 (eight) hours as needed for nausea or vomiting. 20 tablet 0   SYNTHROID 88 MCG tablet Take 1 tablet (88 mcg total) by mouth daily before breakfast. 30 tablet 11   traZODone (DESYREL) 50 MG tablet TAKE 1/2 TO 1 TABLET(25 TO 50 MG) BY MOUTH AT BEDTIME 90 tablet 3   valACYclovir (VALTREX) 500 MG tablet TAKE 1 TABLET(500 MG) BY MOUTH DAILY 90 tablet 1   pantoprazole (PROTONIX) 40 MG tablet TAKE 1 TABLET(40  MG) BY MOUTH DAILY 90 tablet 1   Ca Carbonate-Mag Hydroxide (ROLAIDS) 550-110 MG CHEW Chew by mouth. Take 2 chews every day     No facility-administered medications prior to visit.    ROS: Review of Systems  Constitutional:  Negative for activity change, appetite change, chills, fatigue and unexpected weight change.  HENT:  Negative for congestion, mouth sores and sinus pressure.   Eyes:  Negative for visual disturbance.  Respiratory:  Negative for cough and chest tightness.   Gastrointestinal:  Negative for abdominal pain and nausea.  Genitourinary:  Negative for difficulty urinating, frequency and vaginal pain.  Musculoskeletal:  Negative for back pain and gait problem.  Skin:  Negative for pallor and rash.  Neurological:  Negative for dizziness, tremors, weakness, numbness and headaches.  Psychiatric/Behavioral:  Negative for confusion, sleep disturbance and suicidal ideas.     Objective:  BP 108/72 (BP Location: Right Arm, Patient Position: Sitting, Cuff Size: Normal)   Pulse 63   Temp 98.6 F (37 C) (Oral)   Ht 5' 2.75" (1.594 m)   Wt 113 lb (51.3 kg)   SpO2 98%   BMI 20.18 kg/m   BP Readings from Last 3 Encounters:  09/14/23 108/72  07/26/23 (!) 161/82  06/13/23 110/68    Wt Readings from Last  3 Encounters:  09/14/23 113 lb (51.3 kg)  06/13/23 111 lb (50.3 kg)  04/26/23 113 lb (51.3 kg)    Physical Exam Constitutional:      General: She is not in acute distress.    Appearance: She is well-developed.  HENT:     Head: Normocephalic.     Right Ear: External ear normal.     Left Ear: External ear normal.     Nose: Nose normal.  Eyes:     General:        Right eye: No discharge.        Left eye: No discharge.     Conjunctiva/sclera: Conjunctivae normal.     Pupils: Pupils are equal, round, and reactive to light.  Neck:     Thyroid: No thyromegaly.     Vascular: No JVD.     Trachea: No tracheal deviation.  Cardiovascular:     Rate and Rhythm: Normal rate  and regular rhythm.     Heart sounds: Normal heart sounds.  Pulmonary:     Effort: No respiratory distress.     Breath sounds: No stridor. No wheezing.  Abdominal:     General: Bowel sounds are normal. There is no distension.     Palpations: Abdomen is soft. There is no mass.     Tenderness: There is no abdominal tenderness. There is no guarding or rebound.  Musculoskeletal:        General: No tenderness.     Cervical back: Normal range of motion and neck supple. No rigidity.  Lymphadenopathy:     Cervical: No cervical adenopathy.  Skin:    Findings: No erythema or rash.  Neurological:     Cranial Nerves: No cranial nerve deficit.     Motor: No abnormal muscle tone.     Coordination: Coordination normal.     Deep Tendon Reflexes: Reflexes normal.  Psychiatric:        Behavior: Behavior normal.        Thought Content: Thought content normal.        Judgment: Judgment normal.     Lab Results  Component Value Date   WBC 4.9 06/13/2023   HGB 12.6 06/13/2023   HCT 38.4 06/13/2023   PLT 257.0 06/13/2023   GLUCOSE 118 (H) 07/27/2023   CHOL 197 06/13/2023   TRIG 103.0 06/13/2023   HDL 63.00 06/13/2023   LDLCALC 113 (H) 06/13/2023   ALT 20 06/13/2023   AST 24 06/13/2023   NA 138 07/27/2023   K 3.9 07/27/2023   CL 102 07/27/2023   CREATININE 1.08 07/27/2023   BUN 15 07/27/2023   CO2 30 07/27/2023   TSH 1.43 06/13/2023   HGBA1C 5.9 10/02/2015    No results found.  Assessment & Plan:   Problem List Items Addressed This Visit     Hypothyroidism    On Levothroid 88 mcg/d Obtain thyroid tests q6-12 months      Relevant Orders   CBC with Differential/Platelet   Comprehensive metabolic panel   TSH   Urinalysis   Vitamin B12   Lipid panel   Hemoglobin A1c   Mixed hyperlipidemia    CT Ca score test info      Insomnia    Trazodone prn  Potential benefits of a long term benzodiazepines  use as well as potential risks  and complications were explained to the  patient and were aknowledged.      Bloating    Meteorism, ongoing Oral Flagyl course followed by align  for 1 month      Relevant Orders   CBC with Differential/Platelet   Lipid panel   Rectal prolapse    New symptoms.  PT for pelvic floor exercises referral Treat gas with Flagyl/probiotic Surgical referral was offered Good hydration, stool softener      Relevant Orders   Ambulatory referral to Physical Therapy   Other Visit Diagnoses     Need for influenza vaccination    -  Primary   Relevant Orders   Flu Vaccine Trivalent High Dose (Fluad) (Completed)   Hyperglycemia       Relevant Orders   Comprehensive metabolic panel   TSH   Hemoglobin A1c   Paresthesia       Relevant Orders   Vitamin B12         Meds ordered this encounter  Medications   pantoprazole (PROTONIX) 40 MG tablet    Sig: Take 1 tablet (40 mg total) by mouth daily.    Dispense:  90 tablet    Refill:  3      Follow-up: Return in about 6 months (around 03/13/2024) for a follow-up visit.  Sonda Primes, MD

## 2023-09-15 NOTE — Assessment & Plan Note (Signed)
CT Ca score test info

## 2023-09-15 NOTE — Assessment & Plan Note (Signed)
Trazodone prn  Potential benefits of a long term benzodiazepines  use as well as potential risks  and complications were explained to the patient and were aknowledged

## 2023-09-15 NOTE — Assessment & Plan Note (Signed)
Meteorism, ongoing Oral Flagyl course followed by align for 1 month

## 2023-10-26 ENCOUNTER — Other Ambulatory Visit: Payer: Self-pay | Admitting: Internal Medicine

## 2023-10-31 DIAGNOSIS — H26493 Other secondary cataract, bilateral: Secondary | ICD-10-CM | POA: Diagnosis not present

## 2023-11-02 ENCOUNTER — Other Ambulatory Visit: Payer: Self-pay | Admitting: Internal Medicine

## 2023-11-02 DIAGNOSIS — Z1231 Encounter for screening mammogram for malignant neoplasm of breast: Secondary | ICD-10-CM

## 2023-11-22 ENCOUNTER — Other Ambulatory Visit: Payer: Self-pay | Admitting: Internal Medicine

## 2023-11-24 DIAGNOSIS — Z23 Encounter for immunization: Secondary | ICD-10-CM | POA: Diagnosis not present

## 2024-01-17 ENCOUNTER — Ambulatory Visit
Admission: RE | Admit: 2024-01-17 | Discharge: 2024-01-17 | Disposition: A | Discharge status: Home / Self Care | Payer: Medicare Other | Source: Ambulatory Visit | Attending: Internal Medicine | Admitting: Internal Medicine

## 2024-01-17 DIAGNOSIS — Z1231 Encounter for screening mammogram for malignant neoplasm of breast: Secondary | ICD-10-CM | POA: Diagnosis not present

## 2024-01-22 ENCOUNTER — Ambulatory Visit: Payer: BLUE CROSS/BLUE SHIELD

## 2024-01-26 ENCOUNTER — Other Ambulatory Visit: Payer: Self-pay | Admitting: Internal Medicine

## 2024-01-29 ENCOUNTER — Ambulatory Visit: Payer: BLUE CROSS/BLUE SHIELD

## 2024-04-09 ENCOUNTER — Ambulatory Visit: Payer: BLUE CROSS/BLUE SHIELD | Admitting: Internal Medicine

## 2024-04-10 ENCOUNTER — Ambulatory Visit: Payer: BLUE CROSS/BLUE SHIELD

## 2024-04-25 ENCOUNTER — Ambulatory Visit: Admitting: Internal Medicine

## 2024-04-25 ENCOUNTER — Encounter: Payer: Self-pay | Admitting: Internal Medicine

## 2024-04-25 VITALS — BP 138/80 | HR 56 | Temp 97.6°F | Ht 62.75 in | Wt 115.8 lb

## 2024-04-25 DIAGNOSIS — E039 Hypothyroidism, unspecified: Secondary | ICD-10-CM | POA: Diagnosis not present

## 2024-04-25 DIAGNOSIS — M25511 Pain in right shoulder: Secondary | ICD-10-CM

## 2024-04-25 DIAGNOSIS — G8929 Other chronic pain: Secondary | ICD-10-CM

## 2024-04-25 DIAGNOSIS — R140 Abdominal distension (gaseous): Secondary | ICD-10-CM

## 2024-04-25 DIAGNOSIS — R739 Hyperglycemia, unspecified: Secondary | ICD-10-CM | POA: Diagnosis not present

## 2024-04-25 DIAGNOSIS — E782 Mixed hyperlipidemia: Secondary | ICD-10-CM

## 2024-04-25 DIAGNOSIS — R202 Paresthesia of skin: Secondary | ICD-10-CM

## 2024-04-25 DIAGNOSIS — N1831 Chronic kidney disease, stage 3a: Secondary | ICD-10-CM | POA: Diagnosis not present

## 2024-04-25 LAB — HEMOGLOBIN A1C: Hgb A1c MFr Bld: 6.1 % (ref 4.6–6.5)

## 2024-04-25 LAB — COMPREHENSIVE METABOLIC PANEL WITH GFR
ALT: 20 U/L (ref 0–35)
AST: 22 U/L (ref 0–37)
Albumin: 4.6 g/dL (ref 3.5–5.2)
Alkaline Phosphatase: 71 U/L (ref 39–117)
BUN: 17 mg/dL (ref 6–23)
CO2: 29 meq/L (ref 19–32)
Calcium: 9.3 mg/dL (ref 8.4–10.5)
Chloride: 101 meq/L (ref 96–112)
Creatinine, Ser: 1.11 mg/dL (ref 0.40–1.20)
GFR: 47.95 mL/min — ABNORMAL LOW (ref >60.00)
Glucose, Bld: 104 mg/dL — ABNORMAL HIGH (ref 70–99)
Potassium: 4.4 meq/L (ref 3.5–5.1)
Sodium: 138 meq/L (ref 135–145)
Total Bilirubin: 0.7 mg/dL (ref 0.2–1.2)
Total Protein: 7.6 g/dL (ref 6.0–8.3)

## 2024-04-25 LAB — URINALYSIS
Bilirubin Urine: NEGATIVE
Hgb urine dipstick: NEGATIVE
Ketones, ur: NEGATIVE
Leukocytes,Ua: NEGATIVE
Nitrite: NEGATIVE
Specific Gravity, Urine: <=1.005 — AB (ref 1.000–1.030)
Total Protein, Urine: NEGATIVE
Urine Glucose: NEGATIVE
Urobilinogen, UA: 0.2 (ref 0.0–1.0)
pH: 6.5 (ref 5.0–8.0)

## 2024-04-25 LAB — CBC WITH DIFFERENTIAL/PLATELET
Basophils Absolute: 0 10*3/uL (ref 0.0–0.1)
Basophils Relative: 0.5 % (ref 0.0–3.0)
Eosinophils Absolute: 0.2 10*3/uL (ref 0.0–0.7)
Eosinophils Relative: 2.8 % (ref 0.0–5.0)
HCT: 38.7 % (ref 36.0–46.0)
Hemoglobin: 12.9 g/dL (ref 12.0–15.0)
Lymphocytes Relative: 29.7 % (ref 12.0–46.0)
Lymphs Abs: 1.7 10*3/uL (ref 0.7–4.0)
MCHC: 33.3 g/dL (ref 30.0–36.0)
MCV: 94.8 fl (ref 78.0–100.0)
Monocytes Absolute: 0.5 10*3/uL (ref 0.1–1.0)
Monocytes Relative: 8.7 % (ref 3.0–12.0)
Neutro Abs: 3.3 10*3/uL (ref 1.4–7.7)
Neutrophils Relative %: 58.3 % (ref 43.0–77.0)
Platelets: 247 10*3/uL (ref 150.0–400.0)
RBC: 4.08 Mil/uL (ref 3.87–5.11)
RDW: 13.2 % (ref 11.5–15.5)
WBC: 5.7 10*3/uL (ref 4.0–10.5)

## 2024-04-25 LAB — LIPID PANEL
Cholesterol: 202 mg/dL — ABNORMAL HIGH (ref 0–200)
HDL: 62.4 mg/dL (ref >39.00)
LDL Cholesterol: 116 mg/dL — ABNORMAL HIGH (ref 0–99)
NonHDL: 139.82
Total CHOL/HDL Ratio: 3
Triglycerides: 119 mg/dL (ref 0.0–149.0)
VLDL: 23.8 mg/dL (ref 0.0–40.0)

## 2024-04-25 LAB — VITAMIN B12: Vitamin B-12: 357 pg/mL (ref 211–911)

## 2024-04-25 LAB — TSH: TSH: 0.55 u[IU]/mL (ref 0.35–5.50)

## 2024-04-25 MED ORDER — AMLODIPINE BESYLATE 2.5 MG PO TABS: 2.5000 mg | ORAL_TABLET | Freq: Every day | ORAL | 3 refills | Status: DC

## 2024-04-25 MED ORDER — AMLODIPINE BESYLATE 2.5 MG PO TABS
2.5000 mg | ORAL_TABLET | Freq: Every day | ORAL | 3 refills | Status: AC
Start: 2024-04-25 — End: 2025-04-25

## 2024-04-25 NOTE — Progress Notes (Signed)
 Subjective:  Patient ID: Dawn Tucker, female    DOB: 1946-08-19  Age: 78 y.o. MRN: [id number]  CC: Medical Management of Chronic Issues (6 Month follow up)   HPI Dawn Tucker presents for anxiety, HTN C/o R shoulder pain w/ROM x 1 mo  Outpatient Medications Prior to Visit  Medication Sig Dispense Refill   ALPRAZolam  (XANAX ) 0.25 MG tablet Take 1 tablet (0.25 mg total) by mouth 2 (two) times daily as needed for anxiety. 30 tablet 1   atorvastatin  (LIPITOR) 10 MG tablet TAKE 1 TABLET(10 MG) BY MOUTH DAILY 90 tablet 3   Cholecalciferol (VITAMIN D -3) 125 MCG (5000 UT) TABS Take 1 tablet by mouth daily.     escitalopram  (LEXAPRO ) 5 MG tablet TAKE 1 TABLET(5 MG) BY MOUTH DAILY 90 tablet 2   estradiol  (ESTRACE ) 0.1 MG/GM vaginal cream USE 0.5 TO 1 GRAM VAGINALLY 2 TIMES A WEEK 42.5 g 0   Multiple Vitamin (MULTIVITAMIN) tablet Take 1 tablet by mouth daily.     ondansetron  (ZOFRAN ) 4 MG tablet Take 1 tablet (4 mg total) by mouth every 8 (eight) hours as needed for nausea or vomiting. 20 tablet 0   pantoprazole  (PROTONIX ) 40 MG tablet Take 1 tablet (40 mg total) by mouth daily. 90 tablet 3   Probiotic Product (ALIGN) 4 MG CAPS Take 1 capsule (4 mg total) by mouth daily. 30 capsule 1   PROLIA  60 MG/ML SOSY injection Inject into the skin once. Inject into the skin once every six months     promethazine  (PHENERGAN ) 25 MG tablet Take 1 tablet (25 mg total) by mouth every 8 (eight) hours as needed for nausea or vomiting. 20 tablet 0   SYNTHROID  88 MCG tablet Take 1 tablet (88 mcg total) by mouth daily before breakfast. 30 tablet 11   traZODone  (DESYREL ) 50 MG tablet TAKE 1/2 TO 1 TABLET(25 TO 50 MG) BY MOUTH AT BEDTIME 90 tablet 3   valACYclovir  (VALTREX ) 500 MG tablet TAKE 1 TABLET(500 MG) BY MOUTH DAILY 90 tablet 1   amLODipine  (NORVASC ) 2.5 MG tablet Take 1 tablet (2.5 mg total) by mouth daily. 90 tablet 3   No facility-administered medications prior to visit.    ROS: Review of  Systems  Constitutional:  Negative for activity change, appetite change, chills, fatigue and unexpected weight change.  HENT:  Negative for congestion, mouth sores and sinus pressure.   Eyes:  Negative for visual disturbance.  Respiratory:  Negative for cough and chest tightness.   Gastrointestinal:  Negative for abdominal pain and nausea.  Genitourinary:  Negative for difficulty urinating, frequency and vaginal pain.  Musculoskeletal:  Positive for arthralgias. Negative for back pain and gait problem.  Skin:  Negative for pallor and rash.  Neurological:  Negative for dizziness, tremors, weakness, numbness and headaches.  Psychiatric/Behavioral:  Negative for confusion and sleep disturbance.     Objective:  BP 138/80   Pulse (!) 56   Temp 97.6 F (36.4 C)   Ht 5' 2.75" (1.594 m)   Wt 115 lb 12.8 oz (52.5 kg)   SpO2 98%   BMI 20.68 kg/m   BP Readings from Last 3 Encounters:  04/25/24 138/80  09/14/23 108/72  07/26/23 (!) 161/82    Wt Readings from Last 3 Encounters:  04/25/24 115 lb 12.8 oz (52.5 kg)  09/14/23 113 lb (51.3 kg)  06/13/23 111 lb (50.3 kg)    Physical Exam Constitutional:      General: She is not in acute distress.  Appearance: She is well-developed.  HENT:     Head: Normocephalic.     Right Ear: External ear normal.     Left Ear: External ear normal.     Nose: Nose normal.  Eyes:     General:        Right eye: No discharge.        Left eye: No discharge.     Conjunctiva/sclera: Conjunctivae normal.     Pupils: Pupils are equal, round, and reactive to light.  Neck:     Thyroid : No thyromegaly.     Vascular: No JVD.     Trachea: No tracheal deviation.  Cardiovascular:     Rate and Rhythm: Normal rate and regular rhythm.     Heart sounds: Normal heart sounds.  Pulmonary:     Effort: No respiratory distress.     Breath sounds: No stridor. No wheezing.  Abdominal:     General: Bowel sounds are normal. There is no distension.     Palpations:  Abdomen is soft. There is no mass.     Tenderness: There is no abdominal tenderness. There is no guarding or rebound.  Musculoskeletal:        General: No tenderness.     Cervical back: Normal range of motion and neck supple. No rigidity.  Lymphadenopathy:     Cervical: No cervical adenopathy.  Skin:    Findings: No bruising, erythema or rash.  Neurological:     Cranial Nerves: No cranial nerve deficit.     Motor: No abnormal muscle tone.     Coordination: Coordination normal.     Deep Tendon Reflexes: Reflexes normal.  Psychiatric:        Behavior: Behavior normal.        Thought Content: Thought content normal.        Judgment: Judgment normal.    R shoulder - pain w/ROM  Lab Results  Component Value Date   WBC 4.9 06/13/2023   HGB 12.6 06/13/2023   HCT 38.4 06/13/2023   PLT 257.0 06/13/2023   GLUCOSE 118 (H) 07/27/2023   CHOL 197 06/13/2023   TRIG 103.0 06/13/2023   HDL 63.00 06/13/2023   LDLCALC 113 (H) 06/13/2023   ALT 20 06/13/2023   AST 24 06/13/2023   NA 138 07/27/2023   K 3.9 07/27/2023   CL 102 07/27/2023   CREATININE 1.08 07/27/2023   BUN 15 07/27/2023   CO2 30 07/27/2023   TSH 1.43 06/13/2023   HGBA1C 5.9 10/02/2015    MM 3D SCREENING MAMMOGRAM BILATERAL BREAST Result Date: 01/19/2024 CLINICAL DATA:  Screening. EXAM: DIGITAL SCREENING BILATERAL MAMMOGRAM WITH TOMOSYNTHESIS AND CAD TECHNIQUE: Bilateral screening digital craniocaudal and mediolateral oblique mammograms were obtained. Bilateral screening digital breast tomosynthesis was performed. The images were evaluated with computer-aided detection. COMPARISON:  Previous exam(s). ACR Breast Density Category b: There are scattered areas of fibroglandular density. FINDINGS: There are no findings suspicious for malignancy. IMPRESSION: No mammographic evidence of malignancy. A result letter of this screening mammogram will be mailed directly to the patient. RECOMMENDATION: Screening mammogram in one year.  (Code:SM-B-01Y) BI-RADS CATEGORY  1: Negative. Electronically Signed   By: Dobrinka  Dimitrova M.D.   On: 01/19/2024 12:34    Assessment & Plan:   Problem List Items Addressed This Visit     Hypothyroidism   On Levothroid 88 mcg/d Obtain thyroid  tests q6-12 months      Mixed hyperlipidemia   Relevant Medications   amLODipine  (NORVASC ) 2.5 MG tablet   Chronic  renal impairment, stage 3a (HCC)   Worse Watching GFR Stop Losartan . Re-check labs in 1 month. Consider Renal referral       Bloating - Primary   Try Phyto enzymes      Shoulder pain, right   New         Meds ordered this encounter  Medications   DISCONTD: amLODipine  (NORVASC ) 2.5 MG tablet    Sig: Take 1 tablet (2.5 mg total) by mouth daily.    Dispense:  90 tablet    Refill:  3   amLODipine  (NORVASC ) 2.5 MG tablet    Sig: Take 1 tablet (2.5 mg total) by mouth daily.    Dispense:  90 tablet    Refill:  3      Follow-up: Return in about 6 months (around 10/26/2024) for a follow-up visit.  Anitra Barn, MD

## 2024-04-25 NOTE — Assessment & Plan Note (Signed)
New

## 2024-04-25 NOTE — Assessment & Plan Note (Signed)
 Try Phyto enzymes

## 2024-04-25 NOTE — Assessment & Plan Note (Signed)
Worse Watching GFR Stop Losartan. Re-check labs in 1 month. Consider Renal referral

## 2024-04-25 NOTE — Patient Instructions (Addendum)
 Calming Heat Massaging Weighted Heating Pad by [employer]- Weighted Electric Heating Pad with Massaging Vibrations, 9 Settings- 3 Heat, 6 Massage- 18 Relaxing Combinations, 12" x 24", 4 lbs  Blue-Emu cream -use 2-3 times a day  Digestive enzyme  "NOW" Super enzymes

## 2024-04-25 NOTE — Assessment & Plan Note (Signed)
On Levothroid 88 mcg/d Obtain thyroid tests q6-12 months

## 2024-04-26 ENCOUNTER — Ambulatory Visit (INDEPENDENT_AMBULATORY_CARE_PROVIDER_SITE_OTHER)

## 2024-04-26 VITALS — BP 159/76 | HR 67 | Temp 98.0°F | Resp 16 | Ht 63.0 in | Wt 115.4 lb

## 2024-04-26 DIAGNOSIS — M810 Age-related osteoporosis without current pathological fracture: Secondary | ICD-10-CM | POA: Diagnosis not present

## 2024-04-26 MED ORDER — DENOSUMAB 60 MG/ML ~~LOC~~ SOSY
60.0000 mg | PREFILLED_SYRINGE | Freq: Once | SUBCUTANEOUS | Status: AC
  Administered 2024-04-26: 60 mg via SUBCUTANEOUS

## 2024-04-26 NOTE — Progress Notes (Signed)
 Diagnosis: Osteoporosis  Provider:  Mannam, Praveen MD  Procedure: Injection  Prolia  (Denosumab ), Dose: 60 mg, Site: subcutaneous, Number of injections: 1  Injection Site(s): Left arm  Post Care: Patient declined observation  Discharge: Condition: Good, Destination: Home . AVS Declined  Performed by:  Lendel Quant, RN

## 2024-04-29 DIAGNOSIS — H353132 Nonexudative age-related macular degeneration, bilateral, intermediate dry stage: Secondary | ICD-10-CM | POA: Diagnosis not present

## 2024-05-01 ENCOUNTER — Encounter: Payer: Self-pay | Admitting: Obstetrics and Gynecology

## 2024-05-01 ENCOUNTER — Ambulatory Visit (INDEPENDENT_AMBULATORY_CARE_PROVIDER_SITE_OTHER): Admitting: Obstetrics and Gynecology

## 2024-05-01 VITALS — BP 118/78 | HR 62 | Ht 62.25 in | Wt 114.0 lb

## 2024-05-01 DIAGNOSIS — M8000XK Age-related osteoporosis with current pathological fracture, unspecified site, subsequent encounter for fracture with nonunion: Secondary | ICD-10-CM

## 2024-05-01 DIAGNOSIS — M816 Localized osteoporosis [Lequesne]: Secondary | ICD-10-CM

## 2024-05-01 DIAGNOSIS — B009 Herpesviral infection, unspecified: Secondary | ICD-10-CM

## 2024-05-01 DIAGNOSIS — Z9189 Other specified personal risk factors, not elsewhere classified: Secondary | ICD-10-CM | POA: Diagnosis not present

## 2024-05-01 DIAGNOSIS — Z9289 Personal history of other medical treatment: Secondary | ICD-10-CM

## 2024-05-01 DIAGNOSIS — Z01419 Encounter for gynecological examination (general) (routine) without abnormal findings: Secondary | ICD-10-CM
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
## 2024-05-02 ENCOUNTER — Telehealth: Payer: Self-pay | Admitting: *Deleted
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
## 2024-05-02 NOTE — Telephone Encounter (Signed)
 Patient last received prolia  on 04/26/24, will be due after 10/28/24. Will place in prolia  book for October to run benefits.   Encounter closed.

## 2024-05-14 DIAGNOSIS — M7541 Impingement syndrome of right shoulder: Secondary | ICD-10-CM | POA: Diagnosis not present

## 2024-05-16 ENCOUNTER — Ambulatory Visit (HOSPITAL_BASED_OUTPATIENT_CLINIC_OR_DEPARTMENT_OTHER)
Admission: RE | Admit: 2024-05-16 | Discharge: 2024-05-16 | Disposition: A | Discharge status: Home / Self Care | Source: Ambulatory Visit | Attending: Obstetrics and Gynecology | Admitting: Obstetrics and Gynecology

## 2024-05-16 DIAGNOSIS — Z780 Asymptomatic menopausal state: Secondary | ICD-10-CM | POA: Diagnosis not present

## 2024-05-16 DIAGNOSIS — M85852 Other specified disorders of bone density and structure, left thigh: Secondary | ICD-10-CM | POA: Diagnosis not present

## 2024-05-16 DIAGNOSIS — M816 Localized osteoporosis [Lequesne]: Secondary | ICD-10-CM | POA: Diagnosis not present

## 2024-05-16 DIAGNOSIS — M85851 Other specified disorders of bone density and structure, right thigh: Secondary | ICD-10-CM | POA: Diagnosis not present

## 2024-05-16 DIAGNOSIS — Z9189 Other specified personal risk factors, not elsewhere classified: Secondary | ICD-10-CM | POA: Diagnosis not present

## 2024-05-16 DIAGNOSIS — M8000XK Age-related osteoporosis with current pathological fracture, unspecified site, subsequent encounter for fracture with nonunion: Secondary | ICD-10-CM | POA: Diagnosis not present

## 2024-05-17 ENCOUNTER — Ambulatory Visit (HOSPITAL_BASED_OUTPATIENT_CLINIC_OR_DEPARTMENT_OTHER): Payer: Self-pay | Admitting: Obstetrics and Gynecology

## 2024-05-18 ENCOUNTER — Telehealth: Payer: Self-pay

## 2024-05-18 NOTE — Telephone Encounter (Signed)
 Auth Submission: NO AUTH NEEDED Site of care: Site of care: [facility] Payer: MEDICARE A/B & BCBS SUPP Medication & CPT/J Code(s) submitted: Prolia  (Denosumab ) [id number] Route of submission (phone, fax, portal):  Phone # Fax # Auth type: Buy/Bill Units/visits requested: X2 Reference number:  Approval from: 06/15/23 to 01/18/25

## 2024-07-05 ENCOUNTER — Other Ambulatory Visit: Payer: Self-pay | Admitting: Internal Medicine

## 2024-07-24 ENCOUNTER — Other Ambulatory Visit: Payer: Self-pay | Admitting: Internal Medicine

## 2024-07-30 ENCOUNTER — Ambulatory Visit

## 2024-07-30 VITALS — BP 120/68 | HR 68 | Ht 64.0 in | Wt 115.6 lb

## 2024-07-30 DIAGNOSIS — D225 Melanocytic nevi of trunk: Secondary | ICD-10-CM | POA: Diagnosis not present

## 2024-07-30 DIAGNOSIS — L821 Other seborrheic keratosis: Secondary | ICD-10-CM | POA: Diagnosis not present

## 2024-07-30 DIAGNOSIS — D2261 Melanocytic nevi of right upper limb, including shoulder: Secondary | ICD-10-CM | POA: Diagnosis not present

## 2024-07-30 DIAGNOSIS — Z0000 Encounter for general adult medical examination without abnormal findings: Secondary | ICD-10-CM | POA: Diagnosis not present

## 2024-07-30 DIAGNOSIS — L814 Other melanin hyperpigmentation: Secondary | ICD-10-CM | POA: Diagnosis not present

## 2024-07-30 DIAGNOSIS — D1801 Hemangioma of skin and subcutaneous tissue: Secondary | ICD-10-CM | POA: Diagnosis not present

## 2024-07-30 DIAGNOSIS — D2262 Melanocytic nevi of left upper limb, including shoulder: Secondary | ICD-10-CM | POA: Diagnosis not present

## 2024-07-30 NOTE — Progress Notes (Addendum)
 Subjective:   Dawn Tucker is a 78 y.o. who presents for a Medicare Wellness preventive visit.  As a reminder, Annual Wellness Visits don't include a physical exam, and some assessments may be limited, especially if this visit is performed virtually. We may recommend an in-person follow-up visit with your provider if needed.  Visit Complete: In person   Persons Participating in Visit: Patient.  AWV Questionnaire: Yes: Patient Medicare AWV questionnaire was completed by the patient on 07/29/2024; I have confirmed that all information answered by patient is correct and no changes since this date.  Cardiac Risk Factors include: advanced age (>22men, >35 women);hypertension;dyslipidemia     Objective:    Today's Vitals   07/30/24 1353  BP: 120/68  Pulse: 68  SpO2: 98%  Weight: 115 lb 9.6 oz (52.4 kg)  Height: 5' 4 (1.626 m)   Body mass index is 19.84 kg/m.     07/30/2024    1:52 PM 04/26/2024    1:13 PM 07/04/2022   11:19 AM 10/06/2015   11:48 AM  Advanced Directives  Does Patient Have a Medical Advance Directive? Yes Yes Yes Yes   Type of Estate agent of [place];Living will Healthcare Power of [place];Living will Healthcare Power of [place];Living will   Copy of Healthcare Power of Attorney in Chart? No - copy requested  No - copy requested Yes      Data saved with a previous flowsheet row definition    Current Medications (verified) Outpatient Encounter Medications as of 07/30/2024  Medication Sig   amLODipine (NORVASC) 2.5 MG tablet Take 1 tablet (2.5 mg total) by mouth daily.   atorvastatin (LIPITOR) 10 MG tablet TAKE 1 TABLET(10 MG) BY MOUTH DAILY   Cholecalciferol (VITAMIN D-3) 125 MCG (5000 UT) TABS Take 1 tablet by mouth daily.   escitalopram (LEXAPRO) 5 MG tablet TAKE 1 TABLET(5 MG) BY MOUTH DAILY   estradiol (ESTRACE) 0.1 MG/GM vaginal cream USE 0.5 TO 1 GRAM VAGINALLY 2 TIMES A WEEK   Multiple Vitamin (MULTIVITAMIN) tablet Take 1  tablet by mouth daily.   ondansetron (ZOFRAN) 4 MG tablet Take 1 tablet (4 mg total) by mouth every 8 (eight) hours as needed for nausea or vomiting.   pantoprazole (PROTONIX) 40 MG tablet TAKE 1 TABLET(40 MG) BY MOUTH DAILY   Probiotic Product (ALIGN) 4 MG CAPS Take 1 capsule (4 mg total) by mouth daily.   PROLIA 60 MG/ML SOSY injection Inject into the skin once. Inject into the skin once every six months   promethazine (PHENERGAN) 25 MG tablet Take 1 tablet (25 mg total) by mouth every 8 (eight) hours as needed for nausea or vomiting.   SYNTHROID 88 MCG tablet Take 1 tablet (88 mcg total) by mouth daily before breakfast.   traZODone (DESYREL) 50 MG tablet TAKE 1/2 TO 1 TABLET(25 TO 50 MG) BY MOUTH AT BEDTIME   valACYclovir (VALTREX) 500 MG tablet TAKE 1 TABLET(500 MG) BY MOUTH DAILY   ALPRAZolam (XANAX) 0.25 MG tablet Take 1 tablet (0.25 mg total) by mouth 2 (two) times daily as needed for anxiety. (Patient not taking: Reported on 07/30/2024)   No facility-administered encounter medications on file as of 07/30/2024.    Allergies (verified) Sulfamethoxazole-trimethoprim, Sulfonamide derivatives, and Sulfur   History: Past Medical History:  Diagnosis Date   Abnormal Pap smear of cervix    CHEST PAIN 03/23/2010   DEGENERATIVE DISC DISEASE 08/07/2008   Dysphagia    DYSPHAGIA UNSPECIFIED 03/23/2010   ESOPHAGEAL STRICTURE 01/10/2011  FIBROIDS, UTERUS 08/07/2008   Fractures involving multiple regions of both lower limbs    2 fragility fractures before evenity . now on prolia  repeat bone scan q 2 years   GERD 03/23/2010   Herpes    HIATAL HERNIA 01/10/2011   HYPOTHYROIDISM 08/07/2008   IRRITABLE BOWEL SYNDROME, HX OF 08/07/2008   Macular degeneration 2019   R mild dry   Mixed hyperlipidemia 08/07/2008   Osteoporosis 08/2019   T score -2.5 stable from prior DEXA 2022 -2.2 2 fractures before evenity . now on prolia    PERSONAL HX COLONIC POLYPS 03/23/2010   Scarlet fever 08/07/2008    Unspecified pruritic disorder 08/07/2008   VAGINITIS, ATROPHIC 08/07/2008   Past Surgical History:  Procedure Laterality Date   BREAST EXCISIONAL BIOPSY Bilateral    benign   BREAST SURGERY     Breast biopsies right & left- benign lesions   CESAREAN SECTION     COLONOSCOPY     COLPOSCOPY     GYNECOLOGIC CRYOSURGERY  by Dr. Hassel   low-grade dysplasia   RHINOPLASTY     TUBAL LIGATION     Family History  Problem Relation Age of Onset   Pneumonia Mother    Heart disease Mother 20       CAD   Coronary artery disease Father    Heart disease Father 81       CAD/MI-fatal   Breast cancer Paternal Aunt 32   Ovarian cancer Paternal Aunt        dx in her 40s   Prostate cancer Maternal Grandfather    Breast cancer Sister 97       BRCA negative   Hypertension Sister    Diabetes Neg Hx    Colon cancer Neg Hx    Rectal cancer Neg Hx    Stomach cancer Neg Hx    Esophageal cancer Neg Hx    Social History   Socioeconomic History   Marital status: Widowed    Spouse name: Not on file   Number of children: 2   Years of education: Not on file   Highest education level: Bachelor's degree (e.g., BA, AB, BS)  Occupational History    Employer: UNEMPLOYED  Tobacco Use   Smoking status: Never   Smokeless tobacco: Never  Vaping Use   Vaping status: Never Used  Substance and Sexual Activity   Alcohol use: Yes    Comment: occassional   Drug use: No   Sexual activity: Not Currently    Partners: Male    Birth control/protection: Post-menopausal    Comment: INTERCOURSE AGE 71, LESS THAN 5 SEXUAL PARTNERS  Other Topics Concern   Not on file  Social History Narrative   HSG, [employer]. Married 68'. 1 son- '79, 1 daughter- '71; 2 grandchildren. Marriage is in good health.Very active in community affairs   Social Drivers of Health   Financial Resource Strain: Low Risk  (07/30/2024)   Overall Financial Resource Strain (CARDIA)    Difficulty of Paying Living Expenses: Not hard at  all  Food Insecurity: No Food Insecurity (07/30/2024)   Hunger Vital Sign    Worried About Running Out of Food in the Last Year: Never true    Ran Out of Food in the Last Year: Never true  Transportation Needs: No Transportation Needs (07/30/2024)   PRAPARE - Administrator, Civil Service (Medical): No    Lack of Transportation (Non-Medical): No  Physical Activity: Sufficiently Active (07/30/2024)   Exercise Vital Sign  Days of Exercise per Week: 6 days    Minutes of Exercise per Session: 60 min  Stress: No Stress Concern Present (07/30/2024)   [employer] of Occupational Health - Occupational Stress Questionnaire    Feeling of Stress: Only a little  Social Connections: Socially Integrated (07/30/2024)   Social Connection and Isolation Panel    Frequency of Communication with Friends and Family: More than three times a week    Frequency of Social Gatherings with Friends and Family: More than three times a week    Attends Religious Services: More than 4 times per year    Active Member of [employer] or Organizations: Yes    Attends Engineer, structural: More than 4 times per year    Marital Status: Married    Tobacco Counseling Counseling given: No    Clinical Intake:  Pre-visit preparation completed: Yes  Pain : No/denies pain     BMI - recorded: 19.84 Nutritional Status: BMI of 19-24  Normal Nutritional Risks: None Diabetes: No  Lab Results  Component Value Date   HGBA1C 6.1 04/25/2024   HGBA1C 5.9 10/02/2015     How often do you need to have someone help you when you read instructions, pamphlets, or other written materials from your doctor or pharmacy?: 1 - Never  Interpreter Needed?: No  Information entered by :: Verdie Saba, CMA   Activities of Daily Living     07/30/2024    1:45 PM 07/29/2024    5:19 PM  In your present state of health, do you have any difficulty performing the following activities:  Hearing? 0 0  Vision? 0 0   Difficulty concentrating or making decisions? 0 0  Walking or climbing stairs? 0 0  Dressing or bathing? 0 0  Doing errands, shopping? 0 0  Preparing Food and eating ? N N  Using the Toilet? N N  In the past six months, have you accidently leaked urine? N N  Do you have problems with loss of bowel control? N N  Managing your Medications? N N  Managing your Finances? N N  Housekeeping or managing your Housekeeping? N N    Patient Care Team: Plotnikov, Karlynn GAILS, MD as PCP - General (Internal Medicine) Jane Charleston, MD (Orthopedic Surgery) Abran Norleen SAILOR, MD (Gastroenterology) Helga Slice, MD (Dermatology) Delmonte, Carliss ORN, MD as Consulting Physician (Ophthalmology)  I have updated your Care Teams any recent Medical Services you may have received from other providers in the past year.     Assessment:   This is a routine wellness examination for Filomena.  Hearing/Vision screen Hearing Screening - Comments:: Denies hearing difficulties   Vision Screening - Comments:: Wears rx glasses - up to date with routine eye exams with Dr Leane   Goals Addressed               This Visit's Progress     Patient Stated (pt-stated)        Patient stated she plans to continue being active and exercising - stay healthy and travel       Depression Screen     07/30/2024    1:57 PM 04/26/2024    1:15 PM 09/14/2023   11:10 AM 07/04/2022   11:20 AM 06/09/2022   11:32 AM 06/02/2021   10:53 AM 11/05/2018   10:22 AM  PHQ 2/9 Scores  PHQ - 2 Score 0 0 0 0 0 0 0  PHQ- 9 Score 0     0  Fall Risk     07/30/2024    1:56 PM 07/29/2024    5:19 PM 05/01/2024    1:40 PM 04/26/2024    1:14 PM 09/14/2023   11:09 AM  Fall Risk   Falls in the past year? 0 1 0 0 0  Comment confirmed no falls      Number falls in past yr: 0 0 0  0  Injury with Fall? 0  0  0  Risk for fall due to : No Fall Risks  No Fall Risks No Fall Risks No Fall Risks  Follow up Falls evaluation completed;Falls prevention  discussed  Falls evaluation completed Falls evaluation completed Falls evaluation completed    MEDICARE RISK AT HOME:  Medicare Risk at Home Any stairs in or around the home?: Yes If so, are there any without handrails?: Yes Home free of loose throw rugs in walkways, pet beds, electrical cords, etc?: Yes Adequate lighting in your home to reduce risk of falls?: Yes Life alert?: No Use of a cane, walker or w/c?: No Grab bars in the bathroom?: No Shower chair or bench in shower?: No Elevated toilet seat or a handicapped toilet?: No  TIMED UP AND GO:  Was the test performed?  No  Cognitive Function: 6CIT completed    10/06/2015   12:02 PM  MMSE - Mini Mental State Exam  Not completed: --        07/30/2024    1:59 PM 07/04/2022   11:21 AM  6CIT Screen  What Year? 0 points 0 points  What month? 0 points 0 points  What time? 0 points 0 points  Count back from 20 0 points 0 points  Months in reverse 0 points 0 points  Repeat phrase 0 points 2 points  Total Score 0 points 2 points    Immunizations Immunization History  Administered Date(s) Administered   Fluad Quad(high Dose 65+) 08/31/2019, 09/14/2022   Fluad Trivalent(High Dose 65+) 09/14/2023   H1N1 11/26/2008   Influenza Split 08/28/2012   Influenza, High Dose Seasonal PF 10/04/2016, 08/16/2017, 09/12/2018, 09/01/2021   Influenza,inj,Quad PF,6+ Mos 09/16/2013, 09/23/2014, 10/06/2015   Moderna Sars-Covid-2 Vaccination 03/13/2021   PFIZER(Purple Top)SARS-COV-2 Vaccination 12/29/2019, 01/18/2020, 08/10/2020   Pfizer Covid-19 Vaccine Bivalent Booster 57yrs & up 09/01/2021   Pneumococcal Conjugate-13 09/16/2013   Pneumococcal Polysaccharide-23 07/28/2010, 08/15/2011   Td 09/19/2009   Tdap 12/03/2021   Tetanus 03/18/2013   Zoster Recombinant(Shingrix) 10/31/2017, 01/25/2018   Zoster, Live 09/14/2006    Screening Tests Health Maintenance  Topic Date Due   Pneumococcal Vaccine: 50+ Years (4 of 4 - PCV20 or PCV21)  09/16/2018   COVID-19 Vaccine (6 - 2024-25 season) 08/20/2023   INFLUENZA VACCINE  07/19/2024   Medicare Annual Wellness (AWV)  07/30/2025   DTaP/Tdap/Td (4 - Td or Tdap) 12/04/2031   DEXA SCAN  Completed   Hepatitis C Screening  Completed   Zoster Vaccines- Shingrix  Completed   Hepatitis B Vaccines  Aged Out   HPV VACCINES  Aged Out   Meningococcal B Vaccine  Aged Out   Colonoscopy  Discontinued    Health Maintenance  Health Maintenance Due  Topic Date Due   Pneumococcal Vaccine: 50+ Years (4 of 4 - PCV20 or PCV21) 09/16/2018   COVID-19 Vaccine (6 - 2024-25 season) 08/20/2023   INFLUENZA VACCINE  07/19/2024   Health Maintenance Items Addressed:   Additional Screening:  Vision Screening: Recommended annual ophthalmology exams for early detection of glaucoma and other disorders of the eye.  Would you like a referral to an eye doctor? No    Dental Screening: Recommended annual dental exams for proper oral hygiene  Community Resource Referral / Chronic Care Management: CRR required this visit?  No   CCM required this visit?  No   Plan:    I have personally reviewed and noted the following in the patient's chart:   Medical and social history Use of alcohol, tobacco or illicit drugs  Current medications and supplements including opioid prescriptions. Patient is not currently taking opioid prescriptions. Functional ability and status Nutritional status Physical activity Advanced directives List of other physicians Hospitalizations, surgeries, and ER visits in previous 12 months Vitals Screenings to include cognitive, depression, and falls Referrals and appointments  In addition, I have reviewed and discussed with patient certain preventive protocols, quality metrics, and best practice recommendations. A written personalized care plan for preventive services as well as general preventive health recommendations were provided to patient.   Verdie CHRISTELLA Saba,  CMA   07/30/2024   After Visit Summary: (In Person-Declined) Patient declined AVS at this time.  Notes: Nothing significant to report at this time.   Medical screening examination/treatment/procedure(s) were performed by non-physician practitioner and as supervising physician I was immediately available for consultation/collaboration.  I agree with above. Karlynn Noel, MD

## 2024-07-30 NOTE — Patient Instructions (Signed)
 Ms. Brander , Thank you for taking time out of your busy schedule to complete your Annual Wellness Visit with me. I enjoyed our conversation and look forward to speaking with you again next year. I, as well as your care team,  appreciate your ongoing commitment to your health goals. Please review the following plan we discussed and let me know if I can assist you in the future. Your Game plan/ To Do List    Referrals: If you haven't heard from the office you've been referred to, please reach out to them at the phone provided.   Follow up Visits: We will see or speak with you next year for your Next Medicare AWV with our clinical staff Have you seen your provider in the last 6 months (3 months if uncontrolled diabetes)? No  Clinician Recommendations:  Aim for 30 minutes of exercise or brisk walking, 6-8 glasses of water, and 5 servings of fruits and vegetables each day. Educated and advised on getting the Pneumonia vaccine in 2025.       This is a list of the screenings recommended for you:  Health Maintenance  Topic Date Due   Pneumococcal Vaccine for age over 53 (4 of 4 - PCV20 or PCV21) 09/16/2018   COVID-19 Vaccine (6 - 2024-25 season) 08/20/2023   Flu Shot  07/19/2024   Medicare Annual Wellness Visit  07/30/2025   DTaP/Tdap/Td vaccine (4 - Td or Tdap) 12/04/2031   DEXA scan (bone density measurement)  Completed   Hepatitis C Screening  Completed   Zoster (Shingles) Vaccine  Completed   Hepatitis B Vaccine  Aged Out   HPV Vaccine  Aged Out   Meningitis B Vaccine  Aged Out   Colon Cancer Screening  Discontinued    Advanced directives: (Copy Requested) Please bring a copy of your health care power of attorney and living will to the office to be added to your chart at your convenience. You can mail to [facility] [address] [place], [place] [address] or email to [email or web address] Advance Care Planning is important because it:  [x]  Makes sure you  receive the medical care that is consistent with your values, goals, and preferences  [x]  It provides guidance to your family and loved ones and reduces their decisional burden about whether or not they are making the right decisions based on your wishes.  Follow the link provided in your after visit summary or read over the paperwork we have mailed to you to help you started getting your Advance Directives in place. If you need assistance in completing these, please reach out to us  so that we can help you!

## 2024-08-14 ENCOUNTER — Encounter: Payer: Self-pay | Admitting: Internal Medicine

## 2024-08-19 ENCOUNTER — Other Ambulatory Visit: Payer: Self-pay | Admitting: Internal Medicine

## 2024-08-19 MED ORDER — ONDANSETRON HCL 4 MG PO TABS: 4.0000 mg | ORAL_TABLET | Freq: Three times a day (TID) | ORAL | 0 refills | Status: AC | PRN

## 2024-08-19 MED ORDER — CIPROFLOXACIN HCL 500 MG PO TABS: 500.0000 mg | ORAL_TABLET | Freq: Two times a day (BID) | ORAL | 0 refills | Status: AC

## 2024-08-27 DIAGNOSIS — Z23 Encounter for immunization: Secondary | ICD-10-CM | POA: Diagnosis not present

## 2024-09-02 DIAGNOSIS — Z23 Encounter for immunization: Secondary | ICD-10-CM | POA: Diagnosis not present

## 2024-09-02 MED ORDER — COVID-19 MRNA VAC-TRIS(PFIZER) 30 MCG/0.3ML IM SUSY
0.3000 mL | PREFILLED_SYRINGE | Freq: Once | INTRAMUSCULAR | 0 refills | Status: AC
Start: 2024-09-02 — End: 2024-09-02

## 2024-10-18 ENCOUNTER — Telehealth: Payer: Self-pay

## 2024-10-18 DIAGNOSIS — E782 Mixed hyperlipidemia: Secondary | ICD-10-CM

## 2024-10-18 DIAGNOSIS — Z0000 Encounter for general adult medical examination without abnormal findings: Secondary | ICD-10-CM

## 2024-10-18 NOTE — Telephone Encounter (Signed)
 Copied from CRM #[id number]. Topic: Clinical - Request for Lab/Test Order >> Oct 18, 2024 11:54 AM Dedra B wrote: Reason for CRM: Pt is requesting labs prior to 11/06/24.

## 2024-10-21 NOTE — Addendum Note (Signed)
 Addended by: HEDDY IP R on: 10/21/2024 10:24 AM   Modules accepted: Orders

## 2024-10-21 NOTE — Telephone Encounter (Signed)
 Please inform pt that her labs have been ordered and she can get them drawn when she is able to come in.

## 2024-10-23 ENCOUNTER — Other Ambulatory Visit: Payer: Self-pay | Admitting: *Deleted

## 2024-10-23 ENCOUNTER — Encounter: Payer: Self-pay | Admitting: *Deleted

## 2024-10-23 ENCOUNTER — Other Ambulatory Visit: Payer: Self-pay | Admitting: Internal Medicine

## 2024-10-23 DIAGNOSIS — M8000XK Age-related osteoporosis with current pathological fracture, unspecified site, subsequent encounter for fracture with nonunion: Secondary | ICD-10-CM

## 2024-10-23 MED ORDER — DENOSUMAB 60 MG/ML ~~LOC~~ SOSY: 60.0000 mg | PREFILLED_SYRINGE | SUBCUTANEOUS | Status: AC

## 2024-10-23 MED ORDER — DENOSUMAB 60 MG/ML ~~LOC~~ SOSY
60.0000 mg | PREFILLED_SYRINGE | Freq: Once | SUBCUTANEOUS | 0 refills | Status: DC
  Filled 2024-10-24: qty 1, 1d supply, fill #0

## 2024-10-24 ENCOUNTER — Other Ambulatory Visit: Payer: Self-pay | Admitting: Internal Medicine

## 2024-10-24 ENCOUNTER — Encounter: Payer: Self-pay | Admitting: Sports Medicine

## 2024-10-24 ENCOUNTER — Other Ambulatory Visit (HOSPITAL_COMMUNITY): Payer: Self-pay

## 2024-10-24 ENCOUNTER — Telehealth: Payer: Self-pay

## 2024-10-24 NOTE — Telephone Encounter (Signed)
 PHARMACY COPAY: $846.19

## 2024-10-24 NOTE — Telephone Encounter (Signed)
 Buy/Bill (Office supplied medication)  Out-of-pocket cost due at time of clinic visit: $0  Number of injection/visits approved: ---  Primary: MEDICARE Co-insurance: 0% Admin fee co-insurance: 0%  Secondary: [employer]BS[place]-MED[place]P Co-insurance:  Admin fee co-insurance:   Medical Benefit Details: Date Benefits were checked: 10/24/24 Deductible: $257 Met of $257 Required/ Coinsurance: 0%/ Admin Fee: 0%  Prior Auth: N/A PA# Expiration Date:   # of doses approved: -----------------------------------------------------------------------  Patient NOT eligible for Copay Card. Copay Card can make patient's cost as little as $25. Link to apply: [email or web address]://www.[email or web address].[email or web address]/copay  ** This summary of benefits is an estimation of the patient's out-of-pocket cost. Exact cost may very based on individual plan coverage.

## 2024-10-24 NOTE — Telephone Encounter (Signed)
 Medication will be filled under Medical Benefit. MTDM appointment request cancelled.

## 2024-10-24 NOTE — Telephone Encounter (Signed)
 SABRA

## 2024-10-24 NOTE — Telephone Encounter (Signed)
 Prolia  VOB initiated via [email or web address]  Next Prolia  inj DUE: 10/27/24

## 2024-10-25 ENCOUNTER — Other Ambulatory Visit: Payer: Self-pay

## 2024-10-28 ENCOUNTER — Ambulatory Visit: Admitting: Internal Medicine

## 2024-10-28 ENCOUNTER — Ambulatory Visit

## 2024-10-29 ENCOUNTER — Ambulatory Visit

## 2024-11-04 DIAGNOSIS — H353132 Nonexudative age-related macular degeneration, bilateral, intermediate dry stage: Secondary | ICD-10-CM | POA: Diagnosis not present

## 2024-11-05 ENCOUNTER — Ambulatory Visit: Admitting: Internal Medicine

## 2024-11-05 ENCOUNTER — Ambulatory Visit

## 2024-11-05 VITALS — BP 144/79 | HR 65 | Temp 98.2°F | Resp 18 | Ht 63.0 in | Wt 115.2 lb

## 2024-11-05 DIAGNOSIS — M810 Age-related osteoporosis without current pathological fracture: Secondary | ICD-10-CM

## 2024-11-05 MED ORDER — DENOSUMAB 60 MG/ML ~~LOC~~ SOSY
60.0000 mg | PREFILLED_SYRINGE | Freq: Once | SUBCUTANEOUS | Status: AC
  Administered 2024-11-05: 60 mg via SUBCUTANEOUS
  Filled 2024-11-05: qty 1

## 2024-11-05 NOTE — Progress Notes (Signed)
 Diagnosis: Osteoporosis  Provider:  Chilton Greathouse MD  Procedure: Injection  Prolia (Denosumab), Dose: 60 mg, Site: subcutaneous, Number of injections: 1  Injection Site(s): Right arm  Post Care: Patient declined observation  Discharge: Condition: Good, Destination: Home . AVS Declined  Performed by:  Adriana Mccallum, RN

## 2024-11-06 ENCOUNTER — Encounter: Payer: Self-pay | Admitting: Internal Medicine

## 2024-11-06 ENCOUNTER — Ambulatory Visit: Admitting: Internal Medicine

## 2024-11-06 VITALS — BP 124/82 | HR 67 | Temp 98.0°F | Ht 63.0 in | Wt 115.2 lb

## 2024-11-06 DIAGNOSIS — E039 Hypothyroidism, unspecified: Secondary | ICD-10-CM

## 2024-11-06 DIAGNOSIS — E782 Mixed hyperlipidemia: Secondary | ICD-10-CM | POA: Diagnosis not present

## 2024-11-06 DIAGNOSIS — N2889 Other specified disorders of kidney and ureter: Secondary | ICD-10-CM

## 2024-11-06 DIAGNOSIS — I10 Essential (primary) hypertension: Secondary | ICD-10-CM

## 2024-11-06 DIAGNOSIS — E785 Hyperlipidemia, unspecified: Secondary | ICD-10-CM

## 2024-11-06 DIAGNOSIS — K219 Gastro-esophageal reflux disease without esophagitis: Secondary | ICD-10-CM | POA: Diagnosis not present

## 2024-11-06 DIAGNOSIS — Z7184 Encounter for health counseling related to travel: Secondary | ICD-10-CM | POA: Diagnosis not present

## 2024-11-06 DIAGNOSIS — Z0000 Encounter for general adult medical examination without abnormal findings: Secondary | ICD-10-CM | POA: Diagnosis not present

## 2024-11-06 LAB — COMPREHENSIVE METABOLIC PANEL WITH GFR
ALT: 24 U/L (ref 0–35)
AST: 25 U/L (ref 0–37)
Albumin: 4.6 g/dL (ref 3.5–5.2)
Alkaline Phosphatase: 56 U/L (ref 39–117)
BUN: 22 mg/dL (ref 6–23)
CO2: 32 meq/L (ref 19–32)
Calcium: 9.5 mg/dL (ref 8.4–10.5)
Chloride: 101 meq/L (ref 96–112)
Creatinine, Ser: 1.06 mg/dL (ref 0.40–1.20)
GFR: 50.48 mL/min — ABNORMAL LOW (ref >60.00)
Glucose, Bld: 104 mg/dL — ABNORMAL HIGH (ref 70–99)
Potassium: 4 meq/L (ref 3.5–5.1)
Sodium: 139 meq/L (ref 135–145)
Total Bilirubin: 0.7 mg/dL (ref 0.2–1.2)
Total Protein: 7.6 g/dL (ref 6.0–8.3)

## 2024-11-06 LAB — CBC WITH DIFFERENTIAL/PLATELET
Basophils Absolute: 0 K/uL (ref 0.0–0.1)
Basophils Relative: 0.5 % (ref 0.0–3.0)
Eosinophils Absolute: 0.1 K/uL (ref 0.0–0.7)
Eosinophils Relative: 1.6 % (ref 0.0–5.0)
HCT: 38.2 % (ref 36.0–46.0)
Hemoglobin: 12.8 g/dL (ref 12.0–15.0)
Lymphocytes Relative: 20.9 % (ref 12.0–46.0)
Lymphs Abs: 1.6 K/uL (ref 0.7–4.0)
MCHC: 33.5 g/dL (ref 30.0–36.0)
MCV: 91.6 fl (ref 78.0–100.0)
Monocytes Absolute: 0.5 K/uL (ref 0.1–1.0)
Monocytes Relative: 7.4 % (ref 3.0–12.0)
Neutro Abs: 5.2 K/uL (ref 1.4–7.7)
Neutrophils Relative %: 69.6 % (ref 43.0–77.0)
Platelets: 251 K/uL (ref 150.0–400.0)
RBC: 4.16 Mil/uL (ref 3.87–5.11)
RDW: 13.5 % (ref 11.5–15.5)
WBC: 7.4 K/uL (ref 4.0–10.5)

## 2024-11-06 LAB — LIPID PANEL
Cholesterol: 216 mg/dL — ABNORMAL HIGH (ref 0–200)
HDL: 63.6 mg/dL (ref >39.00)
LDL Cholesterol: 115 mg/dL — ABNORMAL HIGH (ref 0–99)
NonHDL: 152.04
Total CHOL/HDL Ratio: 3
Triglycerides: 187 mg/dL — ABNORMAL HIGH (ref 0.0–149.0)
VLDL: 37.4 mg/dL (ref 0.0–40.0)

## 2024-11-06 LAB — TSH: TSH: 0.68 u[IU]/mL (ref 0.35–5.50)

## 2024-11-06 LAB — VITAMIN B12: Vitamin B-12: 393 pg/mL (ref 211–911)

## 2024-11-06 MED ORDER — ESCITALOPRAM OXALATE 5 MG PO TABS: ORAL_TABLET | ORAL | 2 refills | Status: AC

## 2024-11-06 MED ORDER — ATORVASTATIN CALCIUM 10 MG PO TABS: ORAL_TABLET | ORAL | 3 refills | Status: AC

## 2024-11-06 MED ORDER — ATOVAQUONE-PROGUANIL HCL 250-100 MG PO TABS: ORAL_TABLET | ORAL | 1 refills | Status: AC

## 2024-11-06 NOTE — Progress Notes (Signed)
 Subjective:  Patient ID: Dawn Tucker, female    DOB: 1946/01/07  Age: 78 y.o. MRN: [id number]  CC: Medical Management of Chronic Issues (6 Month follow up)   HPI MANDE AUVIL presents for a f/u on her anxiety, hypertension, hypothyroidism, dyslipidemia Lives in [place] , [place], [place] for several months at the time.  Her significant other is in [place]  She is planning to travel to [place] in January on safari and to visit [place] with her family.  No active complaints    Outpatient Medications Prior to Visit  Medication Sig Dispense Refill   ALPRAZolam  (XANAX ) 0.25 MG tablet Take 1 tablet (0.25 mg total) by mouth 2 (two) times daily as needed for anxiety. 30 tablet 1   amLODipine  (NORVASC ) 2.5 MG tablet Take 1 tablet (2.5 mg total) by mouth daily. 90 tablet 3   Cholecalciferol (VITAMIN D -3) 125 MCG (5000 UT) TABS Take 1 tablet by mouth daily.     estradiol  (ESTRACE ) 0.1 MG/GM vaginal cream USE 0.5 TO 1 GRAM VAGINALLY 2 TIMES A WEEK 42.5 g 0   Multiple Vitamin (MULTIVITAMIN) tablet Take 1 tablet by mouth daily.     ondansetron  (ZOFRAN ) 4 MG tablet Take 1 tablet (4 mg total) by mouth every 8 (eight) hours as needed for nausea or vomiting. 20 tablet 0   pantoprazole  (PROTONIX ) 40 MG tablet TAKE 1 TABLET(40 MG) BY MOUTH DAILY 90 tablet 3   Probiotic Product (ALIGN) 4 MG CAPS Take 1 capsule (4 mg total) by mouth daily. 30 capsule 1   PROLIA  60 MG/ML SOSY injection Inject into the skin once. Inject into the skin once every six months     promethazine  (PHENERGAN ) 25 MG tablet Take 1 tablet (25 mg total) by mouth every 8 (eight) hours as needed for nausea or vomiting. 20 tablet 0   SYNTHROID  88 MCG tablet TAKE 1 TABLET BY MOUTH EVERY DAY 90 tablet 3   traZODone  (DESYREL ) 50 MG tablet TAKE 1/2 TO 1 TABLET(25 TO 50 MG) BY MOUTH AT BEDTIME 90 tablet 3   valACYclovir  (VALTREX ) 500 MG tablet TAKE 1 TABLET(500 MG) BY MOUTH DAILY 90 tablet 1   atorvastatin  (LIPITOR) 10 MG tablet TAKE 1  TABLET(10 MG) BY MOUTH DAILY 90 tablet 3   escitalopram  (LEXAPRO ) 5 MG tablet TAKE 1 TABLET(5 MG) BY MOUTH DAILY 90 tablet 2   Facility-Administered Medications Prior to Visit  Medication Dose Route Frequency Provider Last Rate Last Admin   denosumab  (PROLIA ) injection 60 mg  60 mg Subcutaneous Q6 months Glennon Almarie POUR, MD        ROS: Review of Systems  Constitutional:  Negative for activity change, appetite change, chills, fatigue and unexpected weight change.  HENT:  Negative for congestion, mouth sores and sinus pressure.   Eyes:  Negative for visual disturbance.  Respiratory:  Negative for cough and chest tightness.   Gastrointestinal:  Negative for abdominal pain and nausea.  Genitourinary:  Negative for difficulty urinating, frequency and vaginal pain.  Musculoskeletal:  Negative for back pain and gait problem.  Skin:  Negative for pallor and rash.  Neurological:  Negative for dizziness, tremors, weakness, numbness and headaches.  Psychiatric/Behavioral:  Negative for confusion and sleep disturbance.     Objective:  BP 124/82   Pulse 67   Temp 98 F (36.7 C)   Ht 5' 3 (1.6 m)   Wt 115 lb 3.2 oz (52.3 kg)   SpO2 99%   BMI 20.41 kg/m   BP Readings  from Last 3 Encounters:  11/06/24 124/82  11/05/24 (!) 144/79  07/30/24 120/68    Wt Readings from Last 3 Encounters:  11/06/24 115 lb 3.2 oz (52.3 kg)  11/05/24 115 lb 3.2 oz (52.3 kg)  07/30/24 115 lb 9.6 oz (52.4 kg)    Physical Exam Constitutional:      General: She is not in acute distress.    Appearance: She is well-developed.  HENT:     Head: Normocephalic.     Right Ear: External ear normal.     Left Ear: External ear normal.     Nose: Nose normal.  Eyes:     General:        Right eye: No discharge.        Left eye: No discharge.     Conjunctiva/sclera: Conjunctivae normal.     Pupils: Pupils are equal, round, and reactive to light.  Neck:     Thyroid : No thyromegaly.     Vascular: No JVD.      Trachea: No tracheal deviation.  Cardiovascular:     Rate and Rhythm: Normal rate and regular rhythm.     Heart sounds: Normal heart sounds.  Pulmonary:     Effort: No respiratory distress.     Breath sounds: No stridor. No wheezing.  Abdominal:     General: Bowel sounds are normal. There is no distension.     Palpations: Abdomen is soft. There is no mass.     Tenderness: There is no abdominal tenderness. There is no guarding or rebound.  Musculoskeletal:        General: No tenderness.     Cervical back: Normal range of motion and neck supple. No rigidity.  Lymphadenopathy:     Cervical: No cervical adenopathy.  Skin:    Findings: No erythema or rash.  Neurological:     Cranial Nerves: No cranial nerve deficit.     Motor: No abnormal muscle tone.     Coordination: Coordination normal.     Deep Tendon Reflexes: Reflexes normal.  Psychiatric:        Behavior: Behavior normal.        Thought Content: Thought content normal.        Judgment: Judgment normal.     Lab Results  Component Value Date   WBC 7.4 11/06/2024   HGB 12.8 11/06/2024   HCT 38.2 11/06/2024   PLT 251.0 11/06/2024   GLUCOSE 104 (H) 11/06/2024   CHOL 216 (H) 11/06/2024   TRIG 187.0 (H) 11/06/2024   HDL 63.60 11/06/2024   LDLCALC 115 (H) 11/06/2024   ALT 24 11/06/2024   AST 25 11/06/2024   NA 139 11/06/2024   K 4.0 11/06/2024   CL 101 11/06/2024   CREATININE 1.06 11/06/2024   BUN 22 11/06/2024   CO2 32 11/06/2024   TSH 0.68 11/06/2024   HGBA1C 5.9 11/06/2024    DG BONE DENSITY (DXA) Result Date: 05/16/2024 EXAM: DUAL X-RAY ABSORPTIOMETRY (DXA) FOR BONE MINERAL DENSITY 05/16/2024 9:24 am CLINICAL DATA:  78 year old Female Postmenopausal. Screening for osteoporosis History of fragility fracture. Patient is or has been on bone building therapies (currently on denosumab ). TECHNIQUE: An axial (e.g., hips, spine) and/or appendicular (e.g., radius) exam was performed, as appropriate, using [employer]. Images are obtained for bone mineral density measurement and are not obtained for diagnostic purposes. [id number] Exclusions: L3-L4 due to degenerative changes COMPARISON:  None. New baseline. FINDINGS: Scan quality: Good. LUMBAR SPINE (  L1-L2): BMD (in g/cm2): 1.137 T-score: -0.3 Z-score: 1.9 LEFT FEMORAL NECK: BMD (in g/cm2): 0.781 T-score: -1.9 Z-score: 0.5 LEFT TOTAL HIP: BMD (in g/cm2): 0.810 T-score: -1.6 Z-score: 0.6 RIGHT FEMORAL NECK: BMD (in g/cm2): 0.782 T-score: -1.8 Z-score: 0.5 RIGHT TOTAL HIP: BMD (in g/cm2): 0.845 T-score: -1.3 Z-score: 0.9 FRAX 10-YEAR PROBABILITY OF FRACTURE: FRAX not reported as the patient is receiving bone building therapy. IMPRESSION: Osteopenia based on BMD. Fracture risk is unknown due to history of bone building therapy. RECOMMENDATIONS: 1. All patients should optimize calcium  and vitamin D  intake. 2. Consider FDA-approved medical therapies in postmenopausal women and men aged 63 years and older, based on the following: - A hip or vertebral (clinical or morphometric) fracture - T-score less than or equal to -2.5 and secondary causes have been excluded. - Low bone mass (T-score between -1.0 and -2.5) and a 10-year probability of a hip fracture greater than or equal to 3% or a 10-year probability of a major osteoporosis-related fracture greater than or equal to 20% based on the [place] -adapted WHO algorithm. - Clinician judgment and/or patient preferences may indicate treatment for people with 10-year fracture probabilities above or below these levels 3. Patients with diagnosis of osteoporosis or at high risk for fracture should have regular bone mineral density tests. For patients eligible for Medicare, routine testing is allowed once every 2 years. The testing frequency can be increased to one year for patients who have rapidly progressing disease, those who are receiving or discontinuing medical therapy to restore bone mass, or  have additional risk factors. Electronically Signed   By: Debby Satterfield M.D.   On: 05/16/2024 13:02    Assessment & Plan:   Problem List Items Addressed This Visit     Chronic renal impairment, stage 3a   Obtain lab work with GFR.  Continue to hydrate well      Dyslipidemia - Primary   On Lipitor      Relevant Medications   atorvastatin  (LIPITOR) 10 MG tablet   Essential hypertension   Blood pressure is well-controlled.  Monitor labs/c-Met      Relevant Medications   atorvastatin  (LIPITOR) 10 MG tablet   GERD   Continue on pantoprazole       Hypothyroidism   On Levothroid 88 mcg/d Obtain thyroid  tests q6-12 months      Mixed hyperlipidemia   Relevant Medications   atorvastatin  (LIPITOR) 10 MG tablet   Routine health maintenance   Travel advice encounter   Jowanna Loeffler is planning to travel to [place] in January on safari and to visit [place] with her [place].  Accommodations are going to be upscale as understand. Will provide with hepatitis A and B vaccine Malaria prophylaxis prescribed. She can be referred to travel clinic if advised to have more vaccinations.         Meds ordered this encounter  Medications   atorvastatin  (LIPITOR) 10 MG tablet    Sig: TAKE 1 TABLET(10 MG) BY MOUTH DAILY    Dispense:  90 tablet    Refill:  3   escitalopram  (LEXAPRO ) 5 MG tablet    Sig: TAKE 1 TABLET(5 MG) BY MOUTH DAILY    Dispense:  90 tablet    Refill:  2   atovaquone -proguanil (MALARONE ) 250-100 MG TABS tablet    Sig: Start 1 tablet a day one day prior to your trip.  Take 1 tablet daily while traveling.  Continue 1 tablet daily for 1 week after you returned, then stop.  Dispense:  20 tablet    Refill:  1      Follow-up: Return in about 6 months (around 05/06/2025) for Wellness Exam.  Marolyn Noel, MD

## 2024-11-07 ENCOUNTER — Ambulatory Visit: Admitting: Internal Medicine

## 2024-11-07 ENCOUNTER — Other Ambulatory Visit: Payer: Self-pay

## 2024-11-07 LAB — URINALYSIS, ROUTINE W REFLEX MICROSCOPIC
Bilirubin Urine: NEGATIVE
Hgb urine dipstick: NEGATIVE
Ketones, ur: NEGATIVE
Leukocytes,Ua: NEGATIVE
Nitrite: NEGATIVE
RBC / HPF: NONE SEEN (ref 0–?)
Specific Gravity, Urine: <=1.005 — AB (ref 1.000–1.030)
Total Protein, Urine: NEGATIVE
Urine Glucose: NEGATIVE
Urobilinogen, UA: 0.2 (ref 0.0–1.0)
WBC, UA: NONE SEEN (ref 0–?)
pH: 7 (ref 5.0–8.0)

## 2024-11-07 LAB — HEMOGLOBIN A1C: Hgb A1c MFr Bld: 5.9 % (ref 4.6–6.5)

## 2024-11-08 ENCOUNTER — Ambulatory Visit

## 2024-11-08 DIAGNOSIS — Z23 Encounter for immunization: Secondary | ICD-10-CM

## 2024-11-08 NOTE — Progress Notes (Signed)
 Patient has received her Twinrix vaccine in the right arm and she stated that the injection did not hurt but few seconds later she started to feel some pain and needed water and tylenol and needed to sit before leaving. Patient will to the alternative schedule which is 0,7 and 21 to 30 days followed by booster.

## 2024-11-09 ENCOUNTER — Ambulatory Visit: Payer: Self-pay | Admitting: Internal Medicine

## 2024-11-10 DIAGNOSIS — E785 Hyperlipidemia, unspecified: Secondary | ICD-10-CM | POA: Insufficient documentation

## 2024-11-10 NOTE — Assessment & Plan Note (Signed)
Continue on pantoprazole.

## 2024-11-10 NOTE — Assessment & Plan Note (Signed)
 Obtain lab work with GFR.  Continue to hydrate well

## 2024-11-10 NOTE — Assessment & Plan Note (Signed)
 Blood pressure is well-controlled.  Monitor labs/c-Met

## 2024-11-10 NOTE — Assessment & Plan Note (Signed)
 Dawn Tucker is planning to travel to [place] in January on safari and to visit [place] with her [place].  Accommodations are going to be upscale as understand. Will provide with hepatitis A and B vaccine Malaria prophylaxis prescribed. She can be referred to travel clinic if advised to have more vaccinations.

## 2024-11-10 NOTE — Assessment & Plan Note (Signed)
 On Lipitor

## 2024-11-10 NOTE — Assessment & Plan Note (Signed)
On Levothroid 88 mcg/d Obtain thyroid tests q6-12 months

## 2024-12-02 NOTE — Progress Notes (Unsigned)
 Dawn Console, PA-C [address] [place], [place]  [address] Phone: [phone]   Gastroenterology Consultation  Referring Provider:     Garald Karlynn GAILS, MD Primary Care Physician:  Garald Karlynn GAILS, MD Primary Gastroenterologist:  Dawn Console, PA-C / Norleen Kiang, MD  Reason for Consultation:     Dysphagia, bloating, rectal prolapse        HPI:   Discussed the use of AI scribe software for clinical note transcription with the patient, who gave verbal consent to proceed.  78 year old female, established patient Dr. Kiang, presents to evaluate solid food dysphagia, abdominal bloating/gas, and possible rectal prolapse.  She last saw Dr. Kiang in our office 10/2020 for follow-up of dysphagia and distal esophageal stricture.  She has undergone multiple EGDs with dilations..  Took Dexilant  in the past.  Currently taking pantoprazole  40 mg 1 tablet once daily.  10/2020 last EGD by Dr. Kiang: Large caliber ringlike structure at the GE junction.  Dilated to 20 mm balloon dilator.  Small hiatal hernia.  Incidental benign fundic gland gastric polyps.  Normal duodenum.  07/2019 last colonoscopy:  mild ischemic colitis, otherwise normal.  No polyps.  No repeat due to advanced age.  2016 EGD with esophageal dilation  PMH: GERD, dysphagia, esophageal stricture, IBS, rectal prolapse, diaphragmatic hernia, hypertension, hypothyroidism, osteoporosis, CKD stage III, elevated LFTs, dyslipidemia.  History of Present Illness DAMYAH GUGEL Collin Hendley is a 78 year old female with esophageal strictures who presents with dysphagia. She was referred by her primary doctor, Marolyn Decent, for evaluation of dysphagia and gastrointestinal symptoms.  Dysphagia and esophageal strictures - Dysphagia for approximately ten years - Three prior esophageal dilations, most recent in 2021 - Difficulty swallowing, especially with dry foods - Requires moist foods to facilitate swallowing - Episodes  of food impaction occur about once per week - Food impaction requires her to pause eating and sometimes use sparkling soda to clear the sensation - Occasionally uses her finger to help clear obstructions - No vomiting - No heartburn or acid reflux - Symptoms have worsened over the past four months  Gastrointestinal symptoms - Significant gassiness and stomach discomfort - Flagyl  provided no relief - Probiotics worsened symptoms - Dietary adjustments have not improved symptoms - No diarrhea - Occasional constipation - Avoids known gas-producing foods  Rectal prolapse concern - Concerned about rectal prolapse - No incontinence - Denies rectal bleeding. - She feels a small soft swollen lump which protrudes through the rectum and is reducible after bowel.     Past Medical History:  Diagnosis Date   Abnormal Pap smear of cervix    CHEST PAIN 03/23/2010   DEGENERATIVE DISC DISEASE 08/07/2008   Dysphagia    DYSPHAGIA UNSPECIFIED 03/23/2010   ESOPHAGEAL STRICTURE 01/10/2011   FIBROIDS, UTERUS 08/07/2008   Fractures involving multiple regions of both lower limbs    2 fragility fractures before evenity . now on prolia  repeat bone scan q 2 years   GERD 03/23/2010   Herpes    HIATAL HERNIA 01/10/2011   HYPOTHYROIDISM 08/07/2008   IRRITABLE BOWEL SYNDROME, HX OF 08/07/2008   Macular degeneration 2019   R mild dry   Mixed hyperlipidemia 08/07/2008   Osteoporosis 08/2019   T score -2.5 stable from prior DEXA 2022 -2.2 2 fractures before evenity . now on prolia    PERSONAL HX COLONIC POLYPS 03/23/2010   Scarlet fever 08/07/2008   Unspecified pruritic disorder 08/07/2008   VAGINITIS, ATROPHIC 08/07/2008    Past Surgical History:  Procedure Laterality Date   BREAST EXCISIONAL BIOPSY Bilateral    benign   BREAST SURGERY     Breast biopsies right & left- benign lesions   CESAREAN SECTION     COLONOSCOPY     COLPOSCOPY     GYNECOLOGIC CRYOSURGERY  by Dr. Hassel   low-grade  dysplasia   RHINOPLASTY     TUBAL LIGATION      Prior to Admission medications  Medication Sig Start Date End Date Taking? Authorizing Provider  ALPRAZolam  (XANAX ) 0.25 MG tablet Take 1 tablet (0.25 mg total) by mouth 2 (two) times daily as needed for anxiety. 05/19/20   Plotnikov, Karlynn GAILS, MD  amLODipine  (NORVASC ) 2.5 MG tablet Take 1 tablet (2.5 mg total) by mouth daily. 04/25/24 04/25/25  Plotnikov, Karlynn GAILS, MD  atorvastatin  (LIPITOR) 10 MG tablet TAKE 1 TABLET(10 MG) BY MOUTH DAILY 11/06/24   Plotnikov, Aleksei V, MD  atovaquone -proguanil (MALARONE ) 250-100 MG TABS tablet Start 1 tablet a day one day prior to your trip.  Take 1 tablet daily while traveling.  Continue 1 tablet daily for 1 week after you returned, then stop. 11/06/24   Plotnikov, Aleksei V, MD  Cholecalciferol (VITAMIN D -3) 125 MCG (5000 UT) TABS Take 1 tablet by mouth daily.    [provider]  escitalopram  (LEXAPRO ) 5 MG tablet TAKE 1 TABLET(5 MG) BY MOUTH DAILY 11/06/24   Plotnikov, Aleksei V, MD  estradiol  (ESTRACE ) 0.1 MG/GM vaginal cream USE 0.5 TO 1 GRAM VAGINALLY 2 TIMES A WEEK 11/24/22   Lavoie, Marie-Lyne, MD  Multiple Vitamin (MULTIVITAMIN) tablet Take 1 tablet by mouth daily.    [provider]  ondansetron  (ZOFRAN ) 4 MG tablet Take 1 tablet (4 mg total) by mouth every 8 (eight) hours as needed for nausea or vomiting. 08/19/24   Plotnikov, Karlynn GAILS, MD  pantoprazole  (PROTONIX ) 40 MG tablet TAKE 1 TABLET(40 MG) BY MOUTH DAILY 07/24/24   Plotnikov, Aleksei V, MD  Probiotic Product (ALIGN) 4 MG CAPS Take 1 capsule (4 mg total) by mouth daily. 06/13/23   Plotnikov, Aleksei V, MD  PROLIA  60 MG/ML SOSY injection Inject into the skin once. Inject into the skin once every six months 09/30/21   [provider]  promethazine  (PHENERGAN ) 25 MG tablet Take 1 tablet (25 mg total) by mouth every 8 (eight) hours as needed for nausea or vomiting. 12/02/21   Plotnikov, Karlynn GAILS, MD  SYNTHROID  88 MCG tablet TAKE  1 TABLET BY MOUTH EVERY DAY 10/24/24   Plotnikov, Karlynn GAILS, MD  traZODone  (DESYREL ) 50 MG tablet TAKE 1/2 TO 1 TABLET(25 TO 50 MG) BY MOUTH AT BEDTIME 10/25/24   Plotnikov, Karlynn GAILS, MD  valACYclovir  (VALTREX ) 500 MG tablet TAKE 1 TABLET(500 MG) BY MOUTH DAILY 07/24/24   Plotnikov, Karlynn GAILS, MD    Family History  Problem Relation Age of Onset   Pneumonia Mother    Heart disease Mother 41       CAD   Coronary artery disease Father    Heart disease Father 42       CAD/MI-fatal   Breast cancer Paternal Aunt 79   Ovarian cancer Paternal Aunt        dx in her 53s   Prostate cancer Maternal Grandfather    Breast cancer Sister 71       BRCA negative   Hypertension Sister    Diabetes Neg Hx    Colon cancer Neg Hx    Rectal cancer Neg Hx    Stomach cancer  Neg Hx    Esophageal cancer Neg Hx      Social History[1]  Allergies as of 12/03/2024 - Review Complete 12/03/2024  Allergen Reaction Noted   Sulfamethoxazole-trimethoprim Other (See Comments) 06/09/2022   Sulfonamide derivatives Other (See Comments)    Sulfur Nausea And Vomiting 06/09/2022    Review of Systems:    All systems reviewed and negative except where noted in HPI.   Physical Exam:  BP 138/62 (BP Location: Left Arm, Patient Position: Sitting, Cuff Size: Normal)   Pulse 62   Ht 5' 2 (1.575 m)   Wt 115 lb 4 oz (52.3 kg)   BMI 21.08 kg/m  No LMP recorded. Patient is postmenopausal.  General:   Alert,  Well-developed, well-nourished, pleasant and cooperative in NAD Lungs:  Respirations even and unlabored.  Clear throughout to auscultation.   No wheezes, crackles, or rhonchi. No acute distress. Heart:  Regular rate and rhythm; no murmurs, clicks, rubs, or gallops. Abdomen:  Normal bowel sounds.  No bruits.  Soft, and non-distended without masses, hepatosplenomegaly or hernias noted.  No Tenderness.  No guarding or rebound tenderness.    Neurologic:  Alert and oriented x3;  grossly normal neurologically. Psych:  Alert  and cooperative. Normal mood and affect. Rectal: 1 small external hemorrhoid skin tag.  Otherwise NO external hemorrhoids.  No rectal masses.  Weak internal sphincter tone.  No evidence of significant prolapse when she bears down.  Chaperone for Exam:  Demita Stancil-Glaze, CMA    Imaging Studies: No results found.  Labs: CBC    Component Value Date/Time   WBC 7.4 11/06/2024 1511   RBC 4.16 11/06/2024 1511   HGB 12.8 11/06/2024 1511   HCT 38.2 11/06/2024 1511   PLT 251.0 11/06/2024 1511   MCV 91.6 11/06/2024 1511    CMP     Component Value Date/Time   NA 139 11/06/2024 1511   K 4.0 11/06/2024 1511   CL 101 11/06/2024 1511   CO2 32 11/06/2024 1511   GLUCOSE 104 (H) 11/06/2024 1511   BUN 22 11/06/2024 1511   CREATININE 1.06 11/06/2024 1511   CALCIUM  9.5 11/06/2024 1511   PROT 7.6 11/06/2024 1511   ALBUMIN 4.6 11/06/2024 1511   AST 25 11/06/2024 1511   ALT 24 11/06/2024 1511   ALKPHOS 56 11/06/2024 1511   BILITOT 0.7 11/06/2024 1511   GFRNONAA 52.10 07/16/2010 1010   GFRAA 59 07/30/2008 1004    Assessment and Plan:   HEIDEMARIE GOODNOW is a 78 y.o. y/o female has been referred for: Assessment & Plan 1.  Esophageal stricture with dysphagia Recurrent esophageal stricture with worsening dysphagia, requiring dilation. No heartburn or reflux due to effective medication. Explained endoscopy risks, including bleeding and perforation. - Scheduled EGD with dilation with Dr. Abran I discussed risks of EGD with dilation with patient to include risk of bleeding, perforation, and risk of sedation.  Patient expressed understanding and agrees to proceed with EGD w/ dilation.   2.  Rectal prolapse versus prolapse of internal hemorrhoid. Her exam today does not show evidence of rectal prolase.  Gave patient reassurance.  Suspect she may have an internal hemorrhoid.  Giving trial of treatment. - Rx hydrocortisone  suppository 25 mg nightly x 12 days, #12, 1 refill. - Recommended  Senokot two before bedtime to prevent constipation and straining. - Follow-up if symptoms worsen or persist.  3.  Chronic gassiness and bloating. Differential includes small intestinal bacterial overgrowth (SIBO) and irritable bowel syndrome (IBS). Chronic gassiness and bloating  with occasional constipation. Previous Flagyl  treatment ineffective. Discussed potential IBS and SIBO, dietary modifications, and further testing. - Ordered SIBO test. - Provided information on low FODMAP diet. - Recommended lactase supplements for lactose intolerance. - Suggested Gas-X and Beano for symptomatic relief. - Try OTC Senokot as 50/8.6 mg take 2 tablets once daily before bedtime.   Follow up based on test results and GI symptoms.  Dawn Console, PA-C       [1]  Social History Tobacco Use   Smoking status: Never   Smokeless tobacco: Never  Vaping Use   Vaping status: Never Used  Substance Use Topics   Alcohol use: Yes    Comment: occassional   Drug use: No

## 2024-12-03 ENCOUNTER — Encounter: Payer: Self-pay | Admitting: Physician Assistant

## 2024-12-03 ENCOUNTER — Ambulatory Visit: Admitting: Physician Assistant

## 2024-12-03 VITALS — BP 138/62 | HR 62 | Ht 62.0 in | Wt 115.2 lb

## 2024-12-03 DIAGNOSIS — K222 Esophageal obstruction: Secondary | ICD-10-CM

## 2024-12-03 DIAGNOSIS — K648 Other hemorrhoids: Secondary | ICD-10-CM

## 2024-12-03 DIAGNOSIS — R143 Flatulence: Secondary | ICD-10-CM

## 2024-12-03 DIAGNOSIS — R140 Abdominal distension (gaseous): Secondary | ICD-10-CM | POA: Diagnosis not present

## 2024-12-03 DIAGNOSIS — K5900 Constipation, unspecified: Secondary | ICD-10-CM | POA: Diagnosis not present

## 2024-12-03 DIAGNOSIS — R1310 Dysphagia, unspecified: Secondary | ICD-10-CM | POA: Diagnosis not present

## 2024-12-03 DIAGNOSIS — R1319 Other dysphagia: Secondary | ICD-10-CM

## 2024-12-03 MED ORDER — HYDROCORTISONE ACETATE 25 MG RE SUPP: 25.0000 mg | Freq: Every day | RECTAL | 1 refills | Status: AC

## 2024-12-03 NOTE — Patient Instructions (Addendum)
 You have been scheduled for an endoscopy. Please follow written instructions given to you at your visit today.  If you use inhalers (even only as needed), please bring them with you on the day of your procedure.  If you take any of the following medications, they will need to be adjusted prior to your procedure:   DO NOT TAKE 7 DAYS PRIOR TO TEST- Trulicity (dulaglutide) Ozempic, Wegovy (semaglutide) Mounjaro, Zepbound (tirzepatide) Bydureon Bcise (exanatide extended release)  DO NOT TAKE 1 DAY PRIOR TO YOUR TEST Rybelsus (semaglutide) Adlyxin (lixisenatide) Victoza (liraglutide) Byetta (exanatide) ___________________________________________________________________________  We have sent the following medications to your pharmacy for you to pick up at your convenience: Hydrocortisone  Suppositories  You have been given a low FODMAP Food Chart handout.  You have been given a testing kit to check for small intestine bacterial overgrowth (SIBO) which is completed by a company named [employer]. Make sure to return your test in the mail using the return mailing label given to you along with the kit. The test order, your demographic and insurance information have all already been sent to the company. [facility] will collect an upfront charge of $109.00 for commercial insurance plans and $229.00 if you are paying cash. The potential remaining total after claim submission and review is $120.00. Make sure to discuss with [facility] PRIOR to having the test to see if they have gotten information from your insurance company as to how much your testing will cost out of pocket, if any. Please contact [facility] at phone number [phone] to get instructions regarding how to perform the test as our office is unable to give specific testing instructions.   _______________________________________________________  If your blood pressure at your visit was 140/90 or greater, please  contact your primary care physician to follow up on this.  _______________________________________________________  If you are age 2 or older, your body mass index should be between 23-30. Your Body mass index is 21.08 kg/m. If this is out of the aforementioned range listed, please consider follow up with your Primary Care Provider.  If you are age 21 or younger, your body mass index should be between 19-25. Your Body mass index is 21.08 kg/m. If this is out of the aformentioned range listed, please consider follow up with your Primary Care Provider.   ________________________________________________________  The [facility] providers would like to encourage you to use MYCHART to communicate with providers for non-urgent requests or questions.  Due to long hold times on the telephone, sending your provider a message by [facility] may be a faster and more efficient way to get a response.  Please allow 48 business hours for a response.  Please remember that this is for non-urgent requests.  _______________________________________________________  [facility] is using a team-based approach to care.  Your team is made up of your doctor and two to three APPS. Our APPS (Nurse Practitioners and Physician Assistants) work with your physician to ensure care continuity for you. They are fully qualified to address your health concerns and develop a treatment plan. They communicate directly with your gastroenterologist to care for you. Seeing the Advanced Practice Practitioners on your physician's team can help you by facilitating care more promptly, often allowing for earlier appointments, access to diagnostic testing, procedures, and other specialty referrals.   Due to recent changes in healthcare laws, you may see the results of your imaging and laboratory studies on MyChart before your provider has had a chance to review them.  We understand that in some  cases there may be results that are  confusing or concerning to you. Not all laboratory results come back in the same time frame and the provider may be waiting for multiple results in order to interpret others.  Please give us  48 hours in order for your provider to thoroughly review all the results before contacting the office for clarification of your results.   FOLLOW UP AS NEEDED

## 2024-12-03 NOTE — Progress Notes (Signed)
 Noted. Thank you for seeing Lindsey Demonte

## 2025-01-01 ENCOUNTER — Telehealth: Payer: Self-pay

## 2025-01-01 NOTE — Telephone Encounter (Signed)
 Auth Submission: NO AUTH NEEDED Site of care: Site of care: [facility] Payer: Medicare A/B with BCBS supplement Medication & CPT/J Code(s) submitted: Prolia  (Denosumab ) [id number] Diagnosis Code:  Route of submission (phone, fax, portal):  Phone # Fax # Auth type: Buy/Bill PB Units/visits requested: 60mg  x 2 doses Reference number:  Approval from: 01/01/25 to 01/18/26   Patient does not want to switch to [facility]. She does not like going to the hospital since her husband passes away.

## 2025-01-09 ENCOUNTER — Encounter: Payer: Self-pay | Admitting: Internal Medicine

## 2025-01-09 ENCOUNTER — Ambulatory Visit: Admitting: Internal Medicine

## 2025-01-09 VITALS — BP 110/64 | HR 82 | Temp 98.1°F | Resp 15 | Ht 62.0 in | Wt 115.0 lb

## 2025-01-09 DIAGNOSIS — K222 Esophageal obstruction: Secondary | ICD-10-CM | POA: Diagnosis not present

## 2025-01-09 DIAGNOSIS — K259 Gastric ulcer, unspecified as acute or chronic, without hemorrhage or perforation: Secondary | ICD-10-CM

## 2025-01-09 DIAGNOSIS — R1310 Dysphagia, unspecified: Secondary | ICD-10-CM

## 2025-01-09 DIAGNOSIS — K449 Diaphragmatic hernia without obstruction or gangrene: Secondary | ICD-10-CM

## 2025-01-09 DIAGNOSIS — K317 Polyp of stomach and duodenum: Secondary | ICD-10-CM

## 2025-01-09 DIAGNOSIS — R1319 Other dysphagia: Secondary | ICD-10-CM

## 2025-01-09 DIAGNOSIS — K219 Gastro-esophageal reflux disease without esophagitis: Secondary | ICD-10-CM

## 2025-01-09 MED ORDER — SODIUM CHLORIDE 0.9 % IV SOLN: 500.0000 mL | Freq: Once | INTRAVENOUS | Status: DC

## 2025-01-09 NOTE — Patient Instructions (Signed)
 YOU HAD AN ENDOSCOPIC PROCEDURE TODAY AT THE [facility]:   Refer to the procedure report that was given to you for any specific questions about what was found during the examination.  If the procedure report does not answer your questions, please call your gastroenterologist to clarify.  If you requested that your care partner not be given the details of your procedure findings, then the procedure report has been included in a sealed envelope for you to review at your convenience later.  YOU SHOULD EXPECT: Some feelings of bloating in the abdomen. Passage of more gas than usual.  Walking can help get rid of the air that was put into your GI tract during the procedure and reduce the bloating. If you had a lower endoscopy (such as a colonoscopy or flexible sigmoidoscopy) you may notice spotting of blood in your stool or on the toilet paper. If you underwent a bowel prep for your procedure, you may not have a normal bowel movement for a few days.  Please Note:  You might notice some irritation and congestion in your nose or some drainage.  This is from the oxygen used during your procedure.  There is no need for concern and it should clear up in a day or so.  SYMPTOMS TO REPORT IMMEDIATELY:  Following upper endoscopy (EGD)  Vomiting of blood or coffee ground material  New chest pain or pain under the shoulder blades  Painful or persistently difficult swallowing  New shortness of breath  Fever of 100F or higher  Black, tarry-looking stools  For urgent or emergent issues, a gastroenterologist can be reached at any hour by calling [phone]. Do not use MyChart messaging for urgent concerns.    DIET:  We do recommend a dilation diet today, but then you may proceed to your regular diet tomorrow as tolerated.  Drink plenty of fluids but you should avoid alcoholic beverages for 24 hours.  ACTIVITY:  You should plan to take it easy for the rest of today and you should NOT DRIVE or use  heavy machinery until tomorrow (because of the sedation medicines used during the test).    FOLLOW UP: Our staff will call the number listed on your records the next business day following your procedure.  We will call around 7:15- 8:00 am to check on you and address any questions or concerns that you may have regarding the information given to you following your procedure. If we do not reach you, we will leave a message.      SIGNATURES/CONFIDENTIALITY: You and/or your care partner have signed paperwork which will be entered into your electronic medical record.  These signatures attest to the fact that that the information above on your After Visit Summary has been reviewed and is understood.  Full responsibility of the confidentiality of this discharge information lies with you and/or your care-partner.

## 2025-01-09 NOTE — Progress Notes (Signed)
 Called to room to assist during endoscopic procedure.  Patient ID and intended procedure confirmed with present staff. Received instructions for my participation in the procedure from the performing physician.

## 2025-01-09 NOTE — Progress Notes (Signed)
 Vss nad trans to pacu

## 2025-01-09 NOTE — Progress Notes (Signed)
 Expand All Collapse All       Dawn Console, Dawn Tucker [address] [place], [place]  [address] Phone: [phone]   Gastroenterology Consultation   Referring Provider:     Garald Karlynn GAILS, MD Primary Care Physician:  Dawn Karlynn GAILS, MD Primary Gastroenterologist:  Dawn Console, Dawn Tucker / Dawn Kiang, MD  Reason for Consultation:     Dysphagia, bloating, rectal prolapse        HPI:   Discussed the use of AI scribe software for clinical note transcription with the patient, who gave verbal consent to proceed.   79 year old female, established patient Dr. Kiang, presents to evaluate solid food dysphagia, abdominal bloating/gas, and possible rectal prolapse.   She last saw Dr. Kiang in our office 10/2020 for follow-up of dysphagia and distal esophageal stricture.  She has undergone multiple EGDs with dilations.Dawn Tucker Dexilant  in the past.  Currently taking pantoprazole  40 mg 1 tablet once daily.   10/2020 last EGD by Dr. Kiang: Large caliber ringlike structure at the GE junction.  Dilated to 20 mm balloon dilator.  Small hiatal hernia.  Incidental benign fundic gland gastric polyps.  Normal duodenum.   07/2019 last colonoscopy:  mild ischemic colitis, otherwise normal.  No polyps.  No repeat due to advanced age.   2016 EGD with esophageal dilation   PMH: GERD, dysphagia, esophageal stricture, IBS, rectal prolapse, diaphragmatic hernia, hypertension, hypothyroidism, osteoporosis, CKD stage III, elevated LFTs, dyslipidemia.   History of Present Illness Dawn Tucker is a 79 year old female with esophageal strictures who presents with dysphagia. She was referred by her primary doctor, Dawn Tucker, for evaluation of dysphagia and gastrointestinal symptoms.   Dysphagia and esophageal strictures - Dysphagia for approximately ten years - Three prior esophageal dilations, most recent in 2021 - Difficulty swallowing, especially with dry foods - Requires moist foods  to facilitate swallowing - Episodes of food impaction occur about once per week - Food impaction requires her to pause eating and sometimes use sparkling soda to clear the sensation - Occasionally uses her finger to help clear obstructions - No vomiting - No heartburn or acid reflux - Symptoms have worsened over the past four months   Gastrointestinal symptoms - Significant gassiness and stomach discomfort - Flagyl  provided no relief - Probiotics worsened symptoms - Dietary adjustments have not improved symptoms - No diarrhea - Occasional constipation - Avoids known gas-producing foods   Rectal prolapse concern - Concerned about rectal prolapse - No incontinence - Denies rectal bleeding. - She feels a small soft swollen lump which protrudes through the rectum and is reducible after bowel.           Past Medical History:  Diagnosis Date   Abnormal Pap smear of cervix     CHEST PAIN 03/23/2010   DEGENERATIVE DISC DISEASE 08/07/2008   Dysphagia     DYSPHAGIA UNSPECIFIED 03/23/2010   ESOPHAGEAL STRICTURE 01/10/2011   FIBROIDS, UTERUS 08/07/2008   Fractures involving multiple regions of both lower limbs      2 fragility fractures before evenity . now on prolia  repeat bone scan q 2 years   GERD 03/23/2010   Herpes     HIATAL HERNIA 01/10/2011   HYPOTHYROIDISM 08/07/2008   IRRITABLE BOWEL SYNDROME, HX OF 08/07/2008   Macular degeneration 2019    R mild dry   Mixed hyperlipidemia 08/07/2008   Osteoporosis 08/2019    T score -2.5 stable from prior DEXA 2022 -2.2 2 fractures before evenity . now on  prolia    PERSONAL HX COLONIC POLYPS 03/23/2010   Scarlet fever 08/07/2008   Unspecified pruritic disorder 08/07/2008   VAGINITIS, ATROPHIC 08/07/2008               Past Surgical History:  Procedure Laterality Date   BREAST EXCISIONAL BIOPSY Bilateral      benign   BREAST SURGERY        Breast biopsies right & left- benign lesions   CESAREAN SECTION       COLONOSCOPY        COLPOSCOPY       GYNECOLOGIC CRYOSURGERY   by Dr. Hassel    low-grade dysplasia   RHINOPLASTY       TUBAL LIGATION                     Prior to Admission medications  Medication Sig Start Date End Date Taking? Authorizing Provider  ALPRAZolam  (XANAX ) 0.25 MG tablet Take 1 tablet (0.25 mg total) by mouth 2 (two) times daily as needed for anxiety. 05/19/20     Plotnikov, Aleksei V, MD  amLODipine  (NORVASC ) 2.5 MG tablet Take 1 tablet (2.5 mg total) by mouth daily. 04/25/24 04/25/25   Plotnikov, Karlynn GAILS, MD  atorvastatin  (LIPITOR) 10 MG tablet TAKE 1 TABLET(10 MG) BY MOUTH DAILY 11/06/24     Plotnikov, Aleksei V, MD  atovaquone -proguanil (MALARONE ) 250-100 MG TABS tablet Start 1 tablet a day one day prior to your trip.  Take 1 tablet daily while traveling.  Continue 1 tablet daily for 1 week after you returned, then stop. 11/06/24     Plotnikov, Aleksei V, MD  Cholecalciferol (VITAMIN D -3) 125 MCG (5000 UT) TABS Take 1 tablet by mouth daily.       [provider]  escitalopram  (LEXAPRO ) 5 MG tablet TAKE 1 TABLET(5 MG) BY MOUTH DAILY 11/06/24     Plotnikov, Aleksei V, MD  estradiol  (ESTRACE ) 0.1 MG/GM vaginal cream USE 0.5 TO 1 GRAM VAGINALLY 2 TIMES A WEEK 11/24/22     Lavoie, Marie-Lyne, MD  Multiple Vitamin (MULTIVITAMIN) tablet Take 1 tablet by mouth daily.       [provider]  ondansetron  (ZOFRAN ) 4 MG tablet Take 1 tablet (4 mg total) by mouth every 8 (eight) hours as needed for nausea or vomiting. 08/19/24     Plotnikov, Karlynn GAILS, MD  pantoprazole  (PROTONIX ) 40 MG tablet TAKE 1 TABLET(40 MG) BY MOUTH DAILY 07/24/24     Plotnikov, Aleksei V, MD  Probiotic Product (ALIGN) 4 MG CAPS Take 1 capsule (4 mg total) by mouth daily. 06/13/23     Plotnikov, Aleksei V, MD  PROLIA  60 MG/ML SOSY injection Inject into the skin once. Inject into the skin once every six months 09/30/21     [provider]  promethazine  (PHENERGAN ) 25 MG tablet Take 1 tablet (25 mg total) by mouth  every 8 (eight) hours as needed for nausea or vomiting. 12/02/21     Plotnikov, Karlynn GAILS, MD  SYNTHROID  88 MCG tablet TAKE 1 TABLET BY MOUTH EVERY DAY 10/24/24     Plotnikov, Aleksei V, MD  traZODone  (DESYREL ) 50 MG tablet TAKE 1/2 TO 1 TABLET(25 TO 50 MG) BY MOUTH AT BEDTIME 10/25/24     Plotnikov, Aleksei V, MD  valACYclovir  (VALTREX ) 500 MG tablet TAKE 1 TABLET(500 MG) BY MOUTH DAILY 07/24/24     Plotnikov, Karlynn GAILS, MD           Family History  Problem Relation Age of Onset  Pneumonia Mother     Heart disease Mother 24        CAD   Coronary artery disease Father     Heart disease Father 108        CAD/MI-fatal   Breast cancer Paternal Aunt 39   Ovarian cancer Paternal Aunt          dx in her 92s   Prostate cancer Maternal Grandfather     Breast cancer Sister 66        BRCA negative   Hypertension Sister     Diabetes Neg Hx     Colon cancer Neg Hx     Rectal cancer Neg Hx     Stomach cancer Neg Hx     Esophageal cancer Neg Hx            [Social History]  [Social History]      Tobacco Use   Smoking status: Never   Smokeless tobacco: Never  Vaping Use   Vaping status: Never Used  Substance Use Topics   Alcohol use: Yes      Comment: occassional   Drug use: No          Allergies as of 12/03/2024 - Review Complete 12/03/2024  Allergen Reaction Noted   Sulfamethoxazole-trimethoprim Other (See Comments) 06/09/2022   Sulfonamide derivatives Other (See Comments)     Sulfur Nausea And Vomiting 06/09/2022      Review of Systems:    All systems reviewed and negative except where noted in HPI.    Physical Exam:  BP 138/62 (BP Location: Left Arm, Patient Position: Sitting, Cuff Size: Normal)   Pulse 62   Ht 5' 2 (1.575 m)   Wt 115 lb 4 oz (52.3 kg)   BMI 21.08 kg/m  No LMP recorded. Patient is postmenopausal.   General:   Alert,  Well-developed, well-nourished, pleasant and cooperative in NAD Lungs:  Respirations even and unlabored.  Clear throughout to  auscultation.   No wheezes, crackles, or rhonchi. No acute distress. Heart:  Regular rate and rhythm; no murmurs, clicks, rubs, or gallops. Abdomen:  Normal bowel sounds.  No bruits.  Soft, and non-distended without masses, hepatosplenomegaly or hernias noted.  No Tenderness.  No guarding or rebound tenderness.    Neurologic:  Alert and oriented x3;  grossly normal neurologically. Psych:  Alert and cooperative. Normal mood and affect. Rectal: 1 small external hemorrhoid skin tag.  Otherwise NO external hemorrhoids.  No rectal masses.  Weak internal sphincter tone.  No evidence of significant prolapse when she bears down.   Chaperone for Exam:  Dawn Tucker, CMA      Imaging Studies: Imaging Results  No results found.     Labs: CBC Labs (Brief)          Component Value Date/Time    WBC 7.4 11/06/2024 1511    RBC 4.16 11/06/2024 1511    HGB 12.8 11/06/2024 1511    HCT 38.2 11/06/2024 1511    PLT 251.0 11/06/2024 1511    MCV 91.6 11/06/2024 1511        CMP     Labs (Brief)          Component Value Date/Time    NA 139 11/06/2024 1511    K 4.0 11/06/2024 1511    CL 101 11/06/2024 1511    CO2 32 11/06/2024 1511    GLUCOSE 104 (H) 11/06/2024 1511    BUN 22 11/06/2024 1511    CREATININE 1.06 11/06/2024 1511  CALCIUM  9.5 11/06/2024 1511    PROT 7.6 11/06/2024 1511    ALBUMIN 4.6 11/06/2024 1511    AST 25 11/06/2024 1511    ALT 24 11/06/2024 1511    ALKPHOS 56 11/06/2024 1511    BILITOT 0.7 11/06/2024 1511    GFRNONAA 52.10 07/16/2010 1010    GFRAA 59 07/30/2008 1004        Assessment and Plan:    Dawn Tucker is a 79 y.o. y/o female has been referred for: Assessment & Plan 1.  Esophageal stricture with dysphagia Recurrent esophageal stricture with worsening dysphagia, requiring dilation. No heartburn or reflux due to effective medication. Explained endoscopy risks, including bleeding and perforation. - Scheduled EGD with dilation with Dr. Abran I  discussed risks of EGD with dilation with patient to include risk of bleeding, perforation, and risk of sedation.  Patient expressed understanding and agrees to proceed with EGD w/ dilation.    2.  Rectal prolapse versus prolapse of internal hemorrhoid. Her exam today does not show evidence of rectal prolase.  Gave patient reassurance.  Suspect she may have an internal hemorrhoid.  Giving trial of treatment. - Rx hydrocortisone  suppository 25 mg nightly x 12 days, #12, 1 refill. - Recommended Senokot two before bedtime to prevent constipation and straining. - Follow-up if symptoms worsen or persist.   3.  Chronic gassiness and bloating. Differential includes small intestinal bacterial overgrowth (SIBO) and irritable bowel syndrome (IBS). Chronic gassiness and bloating with occasional constipation. Previous Flagyl  treatment ineffective. Discussed potential IBS and SIBO, dietary modifications, and further testing. - Ordered SIBO test. - Provided information on low FODMAP diet. - Recommended lactase supplements for lactose intolerance. - Suggested Gas-X and Beano for symptomatic relief. - Try OTC Senokot as 50/8.6 mg take 2 tablets once daily before bedtime.     Follow up based on test results and GI symptoms.   Dawn Console, Dawn Tucker  Recent HPI as above.  No interval change.  Now for upper endoscopy with esophageal dilation for dysphagia

## 2025-01-09 NOTE — Progress Notes (Signed)
 Pt's states no medical or surgical changes since previsit or office visit.

## 2025-01-09 NOTE — Op Note (Signed)
 [facility] Patient Name: Dawn Tucker Procedure Date: 01/09/2025 10:53 AM MRN: [id number] Endoscopist: Norleen SAILOR. Abran , MD, [id number] Age: 79 Referring MD:  Date of Birth: [birth year] Gender: Female Account #: [id number] Procedure:                Upper GI endoscopy with balloon dilation of the                            esophagus. 20 mm max Indications:              Dysphagia, Therapeutic procedure, Esophageal                            reflux. Last EGD with esophageal dilation November                            2021 Medicines:                Monitored Anesthesia Care Procedure:                Pre-Anesthesia Assessment:                           - Prior to the procedure, a History and Physical                            was performed, and patient medications and                            allergies were reviewed. The patient's tolerance of                            previous anesthesia was also reviewed. The risks                            and benefits of the procedure and the sedation                            options and risks were discussed with the patient.                            All questions were answered, and informed consent                            was obtained. Prior Anticoagulants: The patient has                            taken no anticoagulant or antiplatelet agents. ASA                            Grade Assessment: II - A patient with mild systemic                            disease. After reviewing the risks and benefits,  the patient was deemed in satisfactory condition to                            undergo the procedure.                           After obtaining informed consent, the endoscope was                            passed under direct vision. Throughout the                            procedure, the patient's blood pressure, pulse, and                            oxygen saturations were monitored continuously. The                             GIF [id number] #[id number] was introduced through the                            mouth, and advanced to the second part of duodenum.                            The upper GI endoscopy was accomplished without                            difficulty. The patient tolerated the procedure                            well. Scope In: Scope Out: Findings:                 One benign-appearing, intrinsic moderate stenosis                            was found 35 cm from the incisors. This stenosis                            measured 1.5 cm (inner diameter). A TTS dilator was                            passed through the scope. Dilation with an 18-19-20                            mm balloon dilator was performed to 20 mm. No                            disruption of the stricture.                           The exam of the esophagus was otherwise normal.                           The stomach revealed  a hiatal hernia, benign fundic                            gland polyps, and few diminutive erosions. The                            stomach was otherwise normal.                           The examined duodenum was normal.                           The cardia and gastric fundus were normal on                            retroflexion. Complications:            No immediate complications. Estimated Blood Loss:     Estimated blood loss: none. Impression:               - Benign-appearing esophageal stenosis. Dilated.                           - Normal stomach except for small hiatal hernia and                            benign fundic gland polyps.                           - Normal examined duodenum.                           - No specimens collected. Recommendation:           - Patient has a contact number available for                            emergencies. The signs and symptoms of potential                            delayed complications were discussed with the                             patient. Return to normal activities tomorrow.                            Written discharge instructions were provided to the                            patient.                           - Post dilation diet.                           - Continue present medications.                           -  Please contact Dr. Abran if you have persistent                            or recurrent issues with swallowing Norleen SAILOR. Abran, MD 01/09/2025 11:22:31 AM This report has been signed electronically.

## 2025-01-10 ENCOUNTER — Telehealth: Payer: Self-pay

## 2025-01-10 NOTE — Telephone Encounter (Signed)
 No answer after follow up call. Voice message left.

## 2025-01-21 ENCOUNTER — Other Ambulatory Visit: Payer: Self-pay | Admitting: Internal Medicine

## 2025-05-08 ENCOUNTER — Encounter: Admitting: Internal Medicine

## 2025-05-14 ENCOUNTER — Ambulatory Visit

## 2025-06-09 ENCOUNTER — Ambulatory Visit

## 2025-08-04 ENCOUNTER — Ambulatory Visit
